# Patient Record
Sex: Female | Born: 1965 | Race: Black or African American | Hispanic: No | Marital: Married | Smoking: Never smoker
Health system: Southern US, Community
[De-identification: ages and names within clinical notes are randomized; demographics above are authoritative.]

## PROBLEM LIST (undated history)

## (undated) DIAGNOSIS — C50919 Malignant neoplasm of unspecified site of unspecified female breast: Secondary | ICD-10-CM

## (undated) DIAGNOSIS — E669 Obesity, unspecified: Secondary | ICD-10-CM

## (undated) DIAGNOSIS — K219 Gastro-esophageal reflux disease without esophagitis: Secondary | ICD-10-CM

## (undated) DIAGNOSIS — I10 Essential (primary) hypertension: Secondary | ICD-10-CM

## (undated) DIAGNOSIS — K635 Polyp of colon: Secondary | ICD-10-CM

## (undated) DIAGNOSIS — M51369 Other intervertebral disc degeneration, lumbar region without mention of lumbar back pain or lower extremity pain: Secondary | ICD-10-CM

## (undated) DIAGNOSIS — Z923 Personal history of irradiation: Secondary | ICD-10-CM

## (undated) DIAGNOSIS — I513 Intracardiac thrombosis, not elsewhere classified: Principal | ICD-10-CM

## (undated) DIAGNOSIS — M5136 Other intervertebral disc degeneration, lumbar region: Secondary | ICD-10-CM

## (undated) DIAGNOSIS — Z853 Personal history of malignant neoplasm of breast: Secondary | ICD-10-CM

## (undated) DIAGNOSIS — D259 Leiomyoma of uterus, unspecified: Secondary | ICD-10-CM

## (undated) DIAGNOSIS — Z9221 Personal history of antineoplastic chemotherapy: Secondary | ICD-10-CM

## (undated) DIAGNOSIS — Z8679 Personal history of other diseases of the circulatory system: Secondary | ICD-10-CM

## (undated) HISTORY — DX: Gastro-esophageal reflux disease without esophagitis: K21.9

## (undated) HISTORY — DX: Other intervertebral disc degeneration, lumbar region: M51.36

## (undated) HISTORY — PX: OTHER SURGICAL HISTORY: SHX169

## (undated) HISTORY — DX: Intracardiac thrombosis, not elsewhere classified: I51.3

## (undated) HISTORY — DX: Personal history of malignant neoplasm of breast: Z85.3

## (undated) HISTORY — PX: LUMBAR LAMINECTOMY/DECOMPRESSION MICRODISCECTOMY: SHX5026

## (undated) HISTORY — DX: Polyp of colon: K63.5

## (undated) HISTORY — DX: Leiomyoma of uterus, unspecified: D25.9

## (undated) HISTORY — DX: Obesity, unspecified: E66.9

## (undated) HISTORY — DX: Malignant neoplasm of unspecified site of unspecified female breast: C50.919

## (undated) HISTORY — DX: Other intervertebral disc degeneration, lumbar region without mention of lumbar back pain or lower extremity pain: M51.369

## (undated) HISTORY — PX: UTERINE FIBROID EMBOLIZATION: SHX825

## (undated) HISTORY — PX: TRANSTHORACIC ECHOCARDIOGRAM: SHX275

## (undated) HISTORY — DX: Personal history of other diseases of the circulatory system: Z86.79

---

## 2002-06-30 ENCOUNTER — Emergency Department (HOSPITAL_COMMUNITY): Admission: EM | Admit: 2002-06-30 | Discharge: 2002-06-30 | Payer: Self-pay | Admitting: Emergency Medicine

## 2002-07-01 ENCOUNTER — Encounter: Payer: Self-pay | Admitting: Emergency Medicine

## 2003-05-21 ENCOUNTER — Other Ambulatory Visit: Admission: RE | Admit: 2003-05-21 | Discharge: 2003-05-21 | Payer: Self-pay | Admitting: Obstetrics and Gynecology

## 2003-06-11 ENCOUNTER — Emergency Department (HOSPITAL_COMMUNITY): Admission: EM | Admit: 2003-06-11 | Discharge: 2003-06-11 | Payer: Self-pay | Admitting: Emergency Medicine

## 2003-10-18 ENCOUNTER — Ambulatory Visit (HOSPITAL_COMMUNITY): Admission: RE | Admit: 2003-10-18 | Discharge: 2003-10-18 | Payer: Self-pay | Admitting: Obstetrics and Gynecology

## 2003-10-18 ENCOUNTER — Encounter (INDEPENDENT_AMBULATORY_CARE_PROVIDER_SITE_OTHER): Payer: Self-pay | Admitting: Specialist

## 2003-11-03 ENCOUNTER — Other Ambulatory Visit: Admission: RE | Admit: 2003-11-03 | Discharge: 2003-11-03 | Payer: Self-pay | Admitting: Obstetrics and Gynecology

## 2004-02-02 ENCOUNTER — Other Ambulatory Visit: Admission: RE | Admit: 2004-02-02 | Discharge: 2004-02-02 | Payer: Self-pay | Admitting: Obstetrics and Gynecology

## 2004-04-14 ENCOUNTER — Ambulatory Visit (HOSPITAL_COMMUNITY): Admission: RE | Admit: 2004-04-14 | Discharge: 2004-04-14 | Payer: Self-pay | Admitting: Obstetrics and Gynecology

## 2004-05-15 ENCOUNTER — Other Ambulatory Visit: Admission: RE | Admit: 2004-05-15 | Discharge: 2004-05-15 | Payer: Self-pay | Admitting: Obstetrics and Gynecology

## 2004-08-22 ENCOUNTER — Other Ambulatory Visit: Admission: RE | Admit: 2004-08-22 | Discharge: 2004-08-22 | Payer: Self-pay | Admitting: Obstetrics and Gynecology

## 2004-12-20 ENCOUNTER — Ambulatory Visit (HOSPITAL_COMMUNITY): Admission: RE | Admit: 2004-12-20 | Discharge: 2004-12-20 | Payer: Self-pay | Admitting: *Deleted

## 2005-06-19 ENCOUNTER — Encounter (INDEPENDENT_AMBULATORY_CARE_PROVIDER_SITE_OTHER): Payer: Self-pay | Admitting: Cardiology

## 2005-06-19 ENCOUNTER — Observation Stay (HOSPITAL_COMMUNITY): Admission: EM | Admit: 2005-06-19 | Discharge: 2005-06-20 | Payer: Self-pay | Admitting: *Deleted

## 2005-08-24 ENCOUNTER — Encounter (INDEPENDENT_AMBULATORY_CARE_PROVIDER_SITE_OTHER): Payer: Self-pay | Admitting: *Deleted

## 2005-08-24 ENCOUNTER — Ambulatory Visit (HOSPITAL_BASED_OUTPATIENT_CLINIC_OR_DEPARTMENT_OTHER): Admission: RE | Admit: 2005-08-24 | Discharge: 2005-08-24 | Payer: Self-pay | Admitting: General Surgery

## 2005-08-24 ENCOUNTER — Encounter: Admission: RE | Admit: 2005-08-24 | Discharge: 2005-08-24 | Payer: Self-pay | Admitting: General Surgery

## 2006-02-10 ENCOUNTER — Inpatient Hospital Stay (HOSPITAL_COMMUNITY): Admission: AD | Admit: 2006-02-10 | Discharge: 2006-02-10 | Payer: Self-pay | Admitting: Obstetrics & Gynecology

## 2006-05-09 ENCOUNTER — Encounter: Admission: RE | Admit: 2006-05-09 | Discharge: 2006-05-09 | Payer: Self-pay | Admitting: Neurosurgery

## 2006-06-14 ENCOUNTER — Ambulatory Visit (HOSPITAL_COMMUNITY): Admission: RE | Admit: 2006-06-14 | Discharge: 2006-06-15 | Payer: Self-pay | Admitting: Neurosurgery

## 2006-08-27 ENCOUNTER — Ambulatory Visit: Payer: Self-pay | Admitting: Family Medicine

## 2006-10-11 ENCOUNTER — Encounter: Admission: RE | Admit: 2006-10-11 | Discharge: 2006-10-11 | Payer: Self-pay | Admitting: Neurosurgery

## 2006-10-31 ENCOUNTER — Encounter
Admission: RE | Admit: 2006-10-31 | Discharge: 2006-10-31 | Payer: Self-pay | Admitting: Physical Medicine and Rehabilitation

## 2006-12-10 ENCOUNTER — Encounter
Admission: RE | Admit: 2006-12-10 | Discharge: 2006-12-10 | Payer: Self-pay | Admitting: Physical Medicine and Rehabilitation

## 2007-01-10 ENCOUNTER — Ambulatory Visit (HOSPITAL_COMMUNITY): Admission: RE | Admit: 2007-01-10 | Discharge: 2007-01-11 | Payer: Self-pay | Admitting: Neurosurgery

## 2007-04-02 ENCOUNTER — Ambulatory Visit: Payer: Self-pay | Admitting: Family Medicine

## 2007-05-09 ENCOUNTER — Ambulatory Visit: Payer: Self-pay | Admitting: Family Medicine

## 2007-08-21 ENCOUNTER — Ambulatory Visit: Payer: Self-pay | Admitting: Family Medicine

## 2007-09-02 ENCOUNTER — Encounter: Admission: RE | Admit: 2007-09-02 | Discharge: 2007-09-02 | Payer: Self-pay | Admitting: Neurosurgery

## 2007-09-22 ENCOUNTER — Ambulatory Visit: Payer: Self-pay | Admitting: Family Medicine

## 2007-09-24 ENCOUNTER — Encounter: Admission: RE | Admit: 2007-09-24 | Discharge: 2007-09-24 | Payer: Self-pay | Admitting: Obstetrics and Gynecology

## 2007-09-26 ENCOUNTER — Encounter: Admission: RE | Admit: 2007-09-26 | Discharge: 2007-09-26 | Payer: Self-pay | Admitting: Family Medicine

## 2007-09-30 ENCOUNTER — Ambulatory Visit: Payer: Self-pay | Admitting: Family Medicine

## 2007-10-10 ENCOUNTER — Encounter: Admission: RE | Admit: 2007-10-10 | Discharge: 2007-10-10 | Payer: Self-pay | Admitting: Internal Medicine

## 2007-10-22 ENCOUNTER — Ambulatory Visit: Payer: Self-pay | Admitting: Family Medicine

## 2007-11-05 ENCOUNTER — Ambulatory Visit: Payer: Self-pay | Admitting: Family Medicine

## 2007-11-26 ENCOUNTER — Ambulatory Visit: Payer: Self-pay | Admitting: Family Medicine

## 2007-11-26 ENCOUNTER — Encounter: Admission: RE | Admit: 2007-11-26 | Discharge: 2007-11-26 | Payer: Self-pay | Admitting: Family Medicine

## 2007-12-10 ENCOUNTER — Encounter: Admission: RE | Admit: 2007-12-10 | Discharge: 2007-12-10 | Payer: Self-pay | Admitting: Obstetrics and Gynecology

## 2007-12-17 ENCOUNTER — Encounter: Admission: RE | Admit: 2007-12-17 | Discharge: 2007-12-17 | Payer: Self-pay | Admitting: Diagnostic Radiology

## 2008-02-02 ENCOUNTER — Ambulatory Visit: Payer: Self-pay | Admitting: Family Medicine

## 2008-02-03 ENCOUNTER — Ambulatory Visit (HOSPITAL_COMMUNITY): Admission: RE | Admit: 2008-02-03 | Discharge: 2008-02-03 | Payer: Self-pay | Admitting: Diagnostic Radiology

## 2008-02-05 ENCOUNTER — Ambulatory Visit (HOSPITAL_COMMUNITY): Admission: RE | Admit: 2008-02-05 | Discharge: 2008-02-06 | Payer: Self-pay | Admitting: Diagnostic Radiology

## 2008-02-15 ENCOUNTER — Emergency Department (HOSPITAL_COMMUNITY): Admission: EM | Admit: 2008-02-15 | Discharge: 2008-02-15 | Payer: Self-pay | Admitting: Emergency Medicine

## 2008-03-09 ENCOUNTER — Encounter: Admission: RE | Admit: 2008-03-09 | Discharge: 2008-03-09 | Payer: Self-pay | Admitting: Diagnostic Radiology

## 2008-05-23 ENCOUNTER — Encounter: Admission: RE | Admit: 2008-05-23 | Discharge: 2008-05-23 | Payer: Self-pay | Admitting: Neurosurgery

## 2008-06-15 ENCOUNTER — Ambulatory Visit: Payer: Self-pay | Admitting: Family Medicine

## 2008-07-15 ENCOUNTER — Ambulatory Visit: Payer: Self-pay | Admitting: Family Medicine

## 2008-07-20 ENCOUNTER — Ambulatory Visit: Payer: Self-pay | Admitting: Family Medicine

## 2008-07-30 DIAGNOSIS — Z853 Personal history of malignant neoplasm of breast: Secondary | ICD-10-CM

## 2008-07-30 HISTORY — DX: Personal history of malignant neoplasm of breast: Z85.3

## 2008-07-30 HISTORY — PX: COLONOSCOPY: SHX174

## 2008-07-30 HISTORY — PX: BREAST LUMPECTOMY: SHX2

## 2008-07-30 HISTORY — PX: TUBAL LIGATION: SHX77

## 2008-08-03 ENCOUNTER — Ambulatory Visit: Payer: Self-pay | Admitting: Family Medicine

## 2008-08-10 ENCOUNTER — Encounter: Admission: RE | Admit: 2008-08-10 | Discharge: 2008-08-10 | Payer: Self-pay | Admitting: Obstetrics and Gynecology

## 2008-08-10 ENCOUNTER — Encounter: Admission: RE | Admit: 2008-08-10 | Discharge: 2008-08-10 | Payer: Self-pay | Admitting: Diagnostic Radiology

## 2008-08-11 ENCOUNTER — Encounter (INDEPENDENT_AMBULATORY_CARE_PROVIDER_SITE_OTHER): Payer: Self-pay | Admitting: Radiology

## 2008-08-11 ENCOUNTER — Encounter: Admission: RE | Admit: 2008-08-11 | Discharge: 2008-08-11 | Payer: Self-pay | Admitting: Obstetrics and Gynecology

## 2008-08-11 HISTORY — PX: BREAST BIOPSY: SHX20

## 2008-08-12 ENCOUNTER — Encounter: Admission: RE | Admit: 2008-08-12 | Discharge: 2008-08-12 | Payer: Self-pay | Admitting: Obstetrics and Gynecology

## 2008-08-16 ENCOUNTER — Ambulatory Visit: Payer: Self-pay | Admitting: Genetic Counselor

## 2008-08-16 ENCOUNTER — Ambulatory Visit: Payer: Self-pay | Admitting: Family Medicine

## 2008-08-16 ENCOUNTER — Ambulatory Visit: Payer: Self-pay | Admitting: Oncology

## 2008-08-20 ENCOUNTER — Encounter: Admission: RE | Admit: 2008-08-20 | Discharge: 2008-08-20 | Payer: Self-pay | Admitting: Obstetrics and Gynecology

## 2008-08-24 ENCOUNTER — Encounter: Admission: RE | Admit: 2008-08-24 | Discharge: 2008-08-24 | Payer: Self-pay | Admitting: Surgery

## 2008-09-06 ENCOUNTER — Encounter (INDEPENDENT_AMBULATORY_CARE_PROVIDER_SITE_OTHER): Payer: Self-pay | Admitting: Diagnostic Radiology

## 2008-09-06 ENCOUNTER — Encounter: Admission: RE | Admit: 2008-09-06 | Discharge: 2008-09-06 | Payer: Self-pay | Admitting: Surgery

## 2008-09-06 HISTORY — PX: BREAST BIOPSY: SHX20

## 2008-09-17 ENCOUNTER — Encounter: Admission: RE | Admit: 2008-09-17 | Discharge: 2008-09-17 | Payer: Self-pay | Admitting: Surgery

## 2008-09-17 ENCOUNTER — Encounter (INDEPENDENT_AMBULATORY_CARE_PROVIDER_SITE_OTHER): Payer: Self-pay | Admitting: Surgery

## 2008-09-17 ENCOUNTER — Ambulatory Visit (HOSPITAL_COMMUNITY): Admission: RE | Admit: 2008-09-17 | Discharge: 2008-09-17 | Payer: Self-pay | Admitting: Surgery

## 2008-09-21 ENCOUNTER — Ambulatory Visit: Payer: Self-pay | Admitting: Family Medicine

## 2008-09-27 ENCOUNTER — Ambulatory Visit: Admission: RE | Admit: 2008-09-27 | Discharge: 2008-11-01 | Payer: Self-pay | Admitting: Radiation Oncology

## 2008-09-29 ENCOUNTER — Ambulatory Visit: Payer: Self-pay | Admitting: Oncology

## 2008-09-30 LAB — CMP (CANCER CENTER ONLY)
Alkaline Phosphatase: 64 U/L (ref 26–84)
BUN, Bld: 8 mg/dL (ref 7–22)
CO2: 27 mEq/L (ref 18–33)
Creat: 0.8 mg/dl (ref 0.6–1.2)
Glucose, Bld: 94 mg/dL (ref 73–118)
Sodium: 142 mEq/L (ref 128–145)
Total Bilirubin: 0.5 mg/dl (ref 0.20–1.60)
Total Protein: 8.2 g/dL — ABNORMAL HIGH (ref 6.4–8.1)

## 2008-09-30 LAB — CBC WITH DIFFERENTIAL (CANCER CENTER ONLY)
BASO%: 0.9 % (ref 0.0–2.0)
EOS%: 3.3 % (ref 0.0–7.0)
HCT: 39.3 % (ref 34.8–46.6)
LYMPH#: 2.5 10*3/uL (ref 0.9–3.3)
LYMPH%: 37.6 % (ref 14.0–48.0)
MCH: 30.9 pg (ref 26.0–34.0)
MCHC: 34.2 g/dL (ref 32.0–36.0)
MONO%: 4.7 % (ref 0.0–13.0)
NEUT%: 53.5 % (ref 39.6–80.0)
RDW: 10.8 % (ref 10.5–14.6)

## 2008-09-30 LAB — CANCER ANTIGEN 27.29: CA 27.29: 23 U/mL (ref 0–39)

## 2008-10-05 ENCOUNTER — Ambulatory Visit (HOSPITAL_COMMUNITY): Admission: RE | Admit: 2008-10-05 | Discharge: 2008-10-05 | Payer: Self-pay | Admitting: Oncology

## 2008-10-06 ENCOUNTER — Ambulatory Visit (HOSPITAL_COMMUNITY): Admission: RE | Admit: 2008-10-06 | Discharge: 2008-10-06 | Payer: Self-pay | Admitting: Surgery

## 2008-10-07 ENCOUNTER — Encounter: Payer: Self-pay | Admitting: Oncology

## 2008-10-07 ENCOUNTER — Ambulatory Visit: Admission: RE | Admit: 2008-10-07 | Discharge: 2008-10-07 | Payer: Self-pay | Admitting: Oncology

## 2008-10-11 ENCOUNTER — Emergency Department (HOSPITAL_COMMUNITY): Admission: EM | Admit: 2008-10-11 | Discharge: 2008-10-11 | Payer: Self-pay | Admitting: Emergency Medicine

## 2008-10-12 LAB — CMP (CANCER CENTER ONLY)
Albumin: 3.5 g/dL (ref 3.3–5.5)
BUN, Bld: 6 mg/dL — ABNORMAL LOW (ref 7–22)
CO2: 29 mEq/L (ref 18–33)
Glucose, Bld: 106 mg/dL (ref 73–118)
Sodium: 142 mEq/L (ref 128–145)
Total Bilirubin: 0.6 mg/dl (ref 0.20–1.60)
Total Protein: 8.2 g/dL — ABNORMAL HIGH (ref 6.4–8.1)

## 2008-10-12 LAB — CBC WITH DIFFERENTIAL (CANCER CENTER ONLY)
BASO%: 0.9 % (ref 0.0–2.0)
EOS%: 2.8 % (ref 0.0–7.0)
HGB: 13.7 g/dL (ref 11.6–15.9)
LYMPH#: 3.5 10*3/uL — ABNORMAL HIGH (ref 0.9–3.3)
MCH: 30.4 pg (ref 26.0–34.0)
MCHC: 33.7 g/dL (ref 32.0–36.0)
MONO%: 7.2 % (ref 0.0–13.0)
NEUT#: 3.3 10*3/uL (ref 1.5–6.5)
Platelets: 291 10*3/uL (ref 145–400)
RDW: 11.2 % (ref 10.5–14.6)

## 2008-10-19 ENCOUNTER — Ambulatory Visit: Payer: Self-pay | Admitting: Family Medicine

## 2008-10-19 LAB — CBC WITH DIFFERENTIAL (CANCER CENTER ONLY)
BASO#: 0 10*3/uL (ref 0.0–0.2)
Eosinophils Absolute: 0.1 10*3/uL (ref 0.0–0.5)
HGB: 13.2 g/dL (ref 11.6–15.9)
LYMPH#: 1.5 10*3/uL (ref 0.9–3.3)
MONO%: 7 % (ref 0.0–13.0)
NEUT#: 1.2 10*3/uL — ABNORMAL LOW (ref 1.5–6.5)
Platelets: 217 10*3/uL (ref 145–400)
RBC: 4.29 10*6/uL (ref 3.70–5.32)
WBC: 3.1 10*3/uL — ABNORMAL LOW (ref 3.9–10.0)

## 2008-10-19 LAB — BASIC METABOLIC PANEL - CANCER CENTER ONLY
Glucose, Bld: 113 mg/dL (ref 73–118)
Potassium: 3.5 mEq/L (ref 3.3–4.7)
Sodium: 137 mEq/L (ref 128–145)

## 2008-10-26 LAB — CBC WITH DIFFERENTIAL (CANCER CENTER ONLY)
BASO#: 0.1 10*3/uL (ref 0.0–0.2)
Eosinophils Absolute: 0.1 10*3/uL (ref 0.0–0.5)
HGB: 12.1 g/dL (ref 11.6–15.9)
LYMPH%: 35.3 % (ref 14.0–48.0)
MCH: 30.6 pg (ref 26.0–34.0)
MCV: 90 fL (ref 81–101)
MONO#: 0.7 10*3/uL (ref 0.1–0.9)
MONO%: 10.6 % (ref 0.0–13.0)
NEUT#: 3.6 10*3/uL (ref 1.5–6.5)
Platelets: 329 10*3/uL (ref 145–400)
RBC: 3.95 10*6/uL (ref 3.70–5.32)
WBC: 6.9 10*3/uL (ref 3.9–10.0)

## 2008-10-26 LAB — CMP (CANCER CENTER ONLY)
Albumin: 3.5 g/dL (ref 3.3–5.5)
CO2: 26 mEq/L (ref 18–33)
Calcium: 9.2 mg/dL (ref 8.0–10.3)
Glucose, Bld: 105 mg/dL (ref 73–118)
Potassium: 3.5 mEq/L (ref 3.3–4.7)
Sodium: 137 mEq/L (ref 128–145)
Total Protein: 7.6 g/dL (ref 6.4–8.1)

## 2008-11-01 ENCOUNTER — Ambulatory Visit: Payer: Self-pay | Admitting: Psychiatry

## 2008-11-02 LAB — CBC WITH DIFFERENTIAL (CANCER CENTER ONLY)
Eosinophils Absolute: 0 10*3/uL (ref 0.0–0.5)
HCT: 34.2 % — ABNORMAL LOW (ref 34.8–46.6)
LYMPH%: 41.6 % (ref 14.0–48.0)
MCH: 30.6 pg (ref 26.0–34.0)
MCV: 89 fL (ref 81–101)
MONO#: 0.2 10*3/uL (ref 0.1–0.9)
MONO%: 7 % (ref 0.0–13.0)
NEUT%: 49.1 % (ref 39.6–80.0)
Platelets: 223 10*3/uL (ref 145–400)
RBC: 3.84 10*6/uL (ref 3.70–5.32)
WBC: 2.6 10*3/uL — ABNORMAL LOW (ref 3.9–10.0)

## 2008-11-02 LAB — BASIC METABOLIC PANEL - CANCER CENTER ONLY
CO2: 28 mEq/L (ref 18–33)
Calcium: 9 mg/dL (ref 8.0–10.3)
Glucose, Bld: 116 mg/dL (ref 73–118)
Potassium: 3.7 mEq/L (ref 3.3–4.7)
Sodium: 143 mEq/L (ref 128–145)

## 2008-11-04 ENCOUNTER — Ambulatory Visit (HOSPITAL_COMMUNITY): Admission: RE | Admit: 2008-11-04 | Discharge: 2008-11-04 | Payer: Self-pay | Admitting: Oncology

## 2008-11-05 ENCOUNTER — Ambulatory Visit (HOSPITAL_COMMUNITY): Admission: RE | Admit: 2008-11-05 | Discharge: 2008-11-05 | Payer: Self-pay | Admitting: Oncology

## 2008-11-09 ENCOUNTER — Ambulatory Visit (HOSPITAL_COMMUNITY): Admission: RE | Admit: 2008-11-09 | Discharge: 2008-11-09 | Payer: Self-pay | Admitting: Oncology

## 2008-11-09 LAB — MANUAL DIFFERENTIAL (CHCC SATELLITE)
ANC (CHCC HP manual diff): 7.3 10*3/uL — ABNORMAL HIGH (ref 1.5–6.7)
Band Neutrophils: 6 % (ref 0–10)
Metamyelocytes: 2 % — ABNORMAL HIGH (ref 0–0)
Myelocytes: 1 % — ABNORMAL HIGH (ref 0–0)
RBC Comments: NORMAL

## 2008-11-09 LAB — CBC WITH DIFFERENTIAL (CANCER CENTER ONLY)
HCT: 34.6 % — ABNORMAL LOW (ref 34.8–46.6)
HGB: 12 g/dL (ref 11.6–15.9)
MCH: 30.6 pg (ref 26.0–34.0)
MCV: 89 fL (ref 81–101)
Platelets: 251 10*3/uL (ref 145–400)
RDW: 11 % (ref 10.5–14.6)
WBC: 9.4 10*3/uL (ref 3.9–10.0)

## 2008-11-09 LAB — CMP (CANCER CENTER ONLY)
ALT(SGPT): 45 U/L (ref 10–47)
CO2: 27 mEq/L (ref 18–33)
Calcium: 9.2 mg/dL (ref 8.0–10.3)
Chloride: 103 mEq/L (ref 98–108)
Creat: 0.7 mg/dl (ref 0.6–1.2)
Glucose, Bld: 99 mg/dL (ref 73–118)
Total Bilirubin: 0.4 mg/dl (ref 0.20–1.60)
Total Protein: 8 g/dL (ref 6.4–8.1)

## 2008-11-09 LAB — TECHNOLOGIST REVIEW CHCC SATELLITE

## 2008-11-15 ENCOUNTER — Ambulatory Visit: Payer: Self-pay | Admitting: Psychiatry

## 2008-11-15 ENCOUNTER — Ambulatory Visit: Payer: Self-pay | Admitting: Oncology

## 2008-11-16 LAB — BASIC METABOLIC PANEL - CANCER CENTER ONLY
BUN, Bld: 8 mg/dL (ref 7–22)
CO2: 26 mEq/L (ref 18–33)
Calcium: 9.5 mg/dL (ref 8.0–10.3)
Creat: 0.7 mg/dl (ref 0.6–1.2)
Glucose, Bld: 122 mg/dL — ABNORMAL HIGH (ref 73–118)

## 2008-11-16 LAB — CBC WITH DIFFERENTIAL (CANCER CENTER ONLY)
BASO%: 0.8 % (ref 0.0–2.0)
EOS%: 2.6 % (ref 0.0–7.0)
HCT: 34.4 % — ABNORMAL LOW (ref 34.8–46.6)
LYMPH#: 1 10*3/uL (ref 0.9–3.3)
MCHC: 34.5 g/dL (ref 32.0–36.0)
MONO#: 0.2 10*3/uL (ref 0.1–0.9)
NEUT#: 0.9 10*3/uL — ABNORMAL LOW (ref 1.5–6.5)
NEUT%: 41.6 % (ref 39.6–80.0)
Platelets: 89 10*3/uL — ABNORMAL LOW (ref 145–400)
RDW: 11.3 % (ref 10.5–14.6)
WBC: 2.2 10*3/uL — ABNORMAL LOW (ref 3.9–10.0)

## 2008-11-23 LAB — CBC WITH DIFFERENTIAL (CANCER CENTER ONLY)
BASO%: 1 % (ref 0.0–2.0)
EOS%: 1.1 % (ref 0.0–7.0)
HCT: 29.5 % — ABNORMAL LOW (ref 34.8–46.6)
LYMPH%: 19.9 % (ref 14.0–48.0)
MCH: 31.1 pg (ref 26.0–34.0)
MCHC: 35.3 g/dL (ref 32.0–36.0)
MCV: 88 fL (ref 81–101)
MONO%: 13.4 % — ABNORMAL HIGH (ref 0.0–13.0)
NEUT%: 64.6 % (ref 39.6–80.0)
Platelets: 350 10*3/uL (ref 145–400)
RDW: 11.1 % (ref 10.5–14.6)
WBC: 6.9 10*3/uL (ref 3.9–10.0)

## 2008-11-23 LAB — CMP (CANCER CENTER ONLY)
ALT(SGPT): 31 U/L (ref 10–47)
AST: 35 U/L (ref 11–38)
Alkaline Phosphatase: 102 U/L — ABNORMAL HIGH (ref 26–84)
Creat: 0.7 mg/dl (ref 0.6–1.2)
Sodium: 144 mEq/L (ref 128–145)
Total Bilirubin: 0.5 mg/dl (ref 0.20–1.60)
Total Protein: 7.7 g/dL (ref 6.4–8.1)

## 2008-12-14 LAB — CMP (CANCER CENTER ONLY)
ALT(SGPT): 55 U/L — ABNORMAL HIGH (ref 10–47)
CO2: 24 mEq/L (ref 18–33)
Calcium: 9.7 mg/dL (ref 8.0–10.3)
Chloride: 104 mEq/L (ref 98–108)
Creat: 0.9 mg/dl (ref 0.6–1.2)
Total Protein: 8.7 g/dL — ABNORMAL HIGH (ref 6.4–8.1)

## 2008-12-14 LAB — CBC WITH DIFFERENTIAL (CANCER CENTER ONLY)
BASO#: 0 10*3/uL (ref 0.0–0.2)
Eosinophils Absolute: 0.1 10*3/uL (ref 0.0–0.5)
HGB: 11.6 g/dL (ref 11.6–15.9)
LYMPH#: 0.8 10*3/uL — ABNORMAL LOW (ref 0.9–3.3)
MONO#: 0.1 10*3/uL (ref 0.1–0.9)
NEUT#: 5.8 10*3/uL (ref 1.5–6.5)
Platelets: 357 10*3/uL (ref 145–400)
RBC: 3.64 10*6/uL — ABNORMAL LOW (ref 3.70–5.32)
WBC: 6.8 10*3/uL (ref 3.9–10.0)

## 2008-12-21 LAB — CMP (CANCER CENTER ONLY)
ALT(SGPT): 62 U/L — ABNORMAL HIGH (ref 10–47)
AST: 52 U/L — ABNORMAL HIGH (ref 11–38)
Albumin: 3.9 g/dL (ref 3.3–5.5)
Alkaline Phosphatase: 92 U/L — ABNORMAL HIGH (ref 26–84)
BUN, Bld: 10 mg/dL (ref 7–22)
CO2: 26 meq/L (ref 18–33)
Calcium: 10.1 mg/dL (ref 8.0–10.3)
Chloride: 102 meq/L (ref 98–108)
Creat: 0.7 mg/dL (ref 0.6–1.2)
Glucose, Bld: 157 mg/dL — ABNORMAL HIGH (ref 73–118)
Potassium: 4.3 meq/L (ref 3.3–4.7)
Sodium: 144 meq/L (ref 128–145)
Total Bilirubin: 0.5 mg/dL (ref 0.20–1.60)
Total Protein: 8.6 g/dL — ABNORMAL HIGH (ref 6.4–8.1)

## 2008-12-21 LAB — CBC WITH DIFFERENTIAL (CANCER CENTER ONLY)
BASO#: 0 10*3/uL (ref 0.0–0.2)
Eosinophils Absolute: 0 10*3/uL (ref 0.0–0.5)
HGB: 11.3 g/dL — ABNORMAL LOW (ref 11.6–15.9)
LYMPH%: 15.8 % (ref 14.0–48.0)
MCH: 32 pg (ref 26.0–34.0)
MCV: 91 fL (ref 81–101)
MONO#: 0 10*3/uL — ABNORMAL LOW (ref 0.1–0.9)
MONO%: 0.8 % (ref 0.0–13.0)
NEUT#: 3.6 10*3/uL (ref 1.5–6.5)
RBC: 3.52 10*6/uL — ABNORMAL LOW (ref 3.70–5.32)
WBC: 4.4 10*3/uL (ref 3.9–10.0)

## 2008-12-22 ENCOUNTER — Ambulatory Visit (HOSPITAL_COMMUNITY): Admission: RE | Admit: 2008-12-22 | Discharge: 2008-12-22 | Payer: Self-pay | Admitting: Oncology

## 2008-12-22 LAB — GAMMA GT: GGT: 126 U/L — ABNORMAL HIGH (ref 7–51)

## 2008-12-22 LAB — HEPATIC FUNCTION PANEL
ALT: 58 U/L — ABNORMAL HIGH (ref 0–35)
AST: 45 U/L — ABNORMAL HIGH (ref 0–37)
Albumin: 5 g/dL (ref 3.5–5.2)
Total Protein: 8.6 g/dL — ABNORMAL HIGH (ref 6.0–8.3)

## 2008-12-22 LAB — HEPATITIS B SURFACE ANTIBODY,QUALITATIVE: Hep B S Ab: NEGATIVE

## 2008-12-28 LAB — CBC WITH DIFFERENTIAL (CANCER CENTER ONLY)
BASO%: 0.2 % (ref 0.0–2.0)
EOS%: 1 % (ref 0.0–7.0)
HCT: 35.8 % (ref 34.8–46.6)
LYMPH#: 0.6 10*3/uL — ABNORMAL LOW (ref 0.9–3.3)
LYMPH%: 11.9 % — ABNORMAL LOW (ref 14.0–48.0)
MCHC: 34.8 g/dL (ref 32.0–36.0)
MCV: 92 fL (ref 81–101)
MONO%: 0.8 % (ref 0.0–13.0)
NEUT%: 86.1 % — ABNORMAL HIGH (ref 39.6–80.0)
RDW: 16.8 % — ABNORMAL HIGH (ref 10.5–14.6)

## 2008-12-28 LAB — CMP (CANCER CENTER ONLY)
ALT(SGPT): 51 U/L — ABNORMAL HIGH (ref 10–47)
AST: 36 U/L (ref 11–38)
Calcium: 9.9 mg/dL (ref 8.0–10.3)
Chloride: 102 mEq/L (ref 98–108)
Creat: 0.6 mg/dl (ref 0.6–1.2)
Sodium: 143 mEq/L (ref 128–145)
Total Bilirubin: 0.6 mg/dl (ref 0.20–1.60)
Total Protein: 8.4 g/dL — ABNORMAL HIGH (ref 6.4–8.1)

## 2008-12-29 ENCOUNTER — Ambulatory Visit: Payer: Self-pay | Admitting: Oncology

## 2009-01-04 LAB — CBC WITH DIFFERENTIAL (CANCER CENTER ONLY)
BASO%: 0.3 % (ref 0.0–2.0)
EOS%: 0.9 % (ref 0.0–7.0)
LYMPH#: 0.6 10*3/uL — ABNORMAL LOW (ref 0.9–3.3)
MCHC: 35.3 g/dL (ref 32.0–36.0)
NEUT#: 6.2 10*3/uL (ref 1.5–6.5)
Platelets: 328 10*3/uL (ref 145–400)
RDW: 16.8 % — ABNORMAL HIGH (ref 10.5–14.6)

## 2009-01-04 LAB — CMP (CANCER CENTER ONLY)
Alkaline Phosphatase: 92 U/L — ABNORMAL HIGH (ref 26–84)
CO2: 20 mEq/L (ref 18–33)
Creat: 0.8 mg/dl (ref 0.6–1.2)
Glucose, Bld: 237 mg/dL — ABNORMAL HIGH (ref 73–118)
Total Bilirubin: 0.6 mg/dl (ref 0.20–1.60)

## 2009-01-11 LAB — CMP (CANCER CENTER ONLY)
Albumin: 3.8 g/dL (ref 3.3–5.5)
CO2: 22 mEq/L (ref 18–33)
Calcium: 10.4 mg/dL — ABNORMAL HIGH (ref 8.0–10.3)
Chloride: 106 mEq/L (ref 98–108)
Glucose, Bld: 166 mg/dL — ABNORMAL HIGH (ref 73–118)
Potassium: 4.5 mEq/L (ref 3.3–4.7)
Sodium: 137 mEq/L (ref 128–145)
Total Protein: 8.8 g/dL — ABNORMAL HIGH (ref 6.4–8.1)

## 2009-01-11 LAB — CBC WITH DIFFERENTIAL (CANCER CENTER ONLY)
BASO#: 0 10*3/uL (ref 0.0–0.2)
EOS%: 1.1 % (ref 0.0–7.0)
Eosinophils Absolute: 0.1 10*3/uL (ref 0.0–0.5)
HGB: 12.5 g/dL (ref 11.6–15.9)
LYMPH#: 0.7 10*3/uL — ABNORMAL LOW (ref 0.9–3.3)
MCHC: 34.8 g/dL (ref 32.0–36.0)
NEUT#: 5.4 10*3/uL (ref 1.5–6.5)
Platelets: 295 10*3/uL (ref 145–400)
RBC: 3.85 10*6/uL (ref 3.70–5.32)

## 2009-01-14 ENCOUNTER — Ambulatory Visit: Payer: Self-pay | Admitting: Family Medicine

## 2009-01-18 LAB — CMP (CANCER CENTER ONLY)
ALT(SGPT): 56 U/L — ABNORMAL HIGH (ref 10–47)
AST: 36 U/L (ref 11–38)
Albumin: 3.5 g/dL (ref 3.3–5.5)
Alkaline Phosphatase: 84 U/L (ref 26–84)
BUN, Bld: 11 mg/dL (ref 7–22)
Potassium: 4.2 mEq/L (ref 3.3–4.7)
Sodium: 141 mEq/L (ref 128–145)
Total Protein: 8 g/dL (ref 6.4–8.1)

## 2009-01-18 LAB — CBC WITH DIFFERENTIAL (CANCER CENTER ONLY)
BASO#: 0 10*3/uL (ref 0.0–0.2)
Eosinophils Absolute: 0 10*3/uL (ref 0.0–0.5)
HCT: 34.9 % (ref 34.8–46.6)
HGB: 12.1 g/dL (ref 11.6–15.9)
LYMPH%: 21.8 % (ref 14.0–48.0)
MCH: 32.5 pg (ref 26.0–34.0)
MCV: 94 fL (ref 81–101)
MONO#: 0 10*3/uL — ABNORMAL LOW (ref 0.1–0.9)
MONO%: 1.1 % (ref 0.0–13.0)
NEUT%: 76.1 % (ref 39.6–80.0)
Platelets: 310 10*3/uL (ref 145–400)
RBC: 3.72 10*6/uL (ref 3.70–5.32)
WBC: 3.1 10*3/uL — ABNORMAL LOW (ref 3.9–10.0)

## 2009-01-25 LAB — CBC WITH DIFFERENTIAL (CANCER CENTER ONLY)
BASO#: 0 10*3/uL (ref 0.0–0.2)
Eosinophils Absolute: 0 10*3/uL (ref 0.0–0.5)
HGB: 12.4 g/dL (ref 11.6–15.9)
MCH: 32.8 pg (ref 26.0–34.0)
MONO#: 0 10*3/uL — ABNORMAL LOW (ref 0.1–0.9)
MONO%: 0.9 % (ref 0.0–13.0)
NEUT#: 3.3 10*3/uL (ref 1.5–6.5)
RBC: 3.79 10*6/uL (ref 3.70–5.32)
WBC: 4.2 10*3/uL (ref 3.9–10.0)

## 2009-01-25 LAB — CMP (CANCER CENTER ONLY)
AST: 35 U/L (ref 11–38)
BUN, Bld: 11 mg/dL (ref 7–22)
Calcium: 9.9 mg/dL (ref 8.0–10.3)
Chloride: 99 mEq/L (ref 98–108)
Creat: 0.7 mg/dl (ref 0.6–1.2)

## 2009-01-26 ENCOUNTER — Ambulatory Visit: Payer: Self-pay | Admitting: Oncology

## 2009-02-01 LAB — BASIC METABOLIC PANEL - CANCER CENTER ONLY
BUN, Bld: 13 mg/dL (ref 7–22)
Calcium: 10 mg/dL (ref 8.0–10.3)
Creat: 0.6 mg/dl (ref 0.6–1.2)
Glucose, Bld: 198 mg/dL — ABNORMAL HIGH (ref 73–118)
Potassium: 4.3 mEq/L (ref 3.3–4.7)

## 2009-02-01 LAB — CBC WITH DIFFERENTIAL (CANCER CENTER ONLY)
EOS%: 0.8 % (ref 0.0–7.0)
Eosinophils Absolute: 0 10*3/uL (ref 0.0–0.5)
LYMPH#: 1 10*3/uL (ref 0.9–3.3)
MCH: 33 pg (ref 26.0–34.0)
MCHC: 34.8 g/dL (ref 32.0–36.0)
MONO%: 1.5 % (ref 0.0–13.0)
NEUT#: 3.5 10*3/uL (ref 1.5–6.5)
Platelets: 289 10*3/uL (ref 145–400)
RBC: 3.71 10*6/uL (ref 3.70–5.32)

## 2009-02-08 LAB — CMP (CANCER CENTER ONLY)
Alkaline Phosphatase: 87 U/L — ABNORMAL HIGH (ref 26–84)
Glucose, Bld: 166 mg/dL — ABNORMAL HIGH (ref 73–118)
Sodium: 135 mEq/L (ref 128–145)
Total Bilirubin: 0.5 mg/dl (ref 0.20–1.60)
Total Protein: 8.5 g/dL — ABNORMAL HIGH (ref 6.4–8.1)

## 2009-02-08 LAB — CBC WITH DIFFERENTIAL (CANCER CENTER ONLY)
Eosinophils Absolute: 0 10*3/uL (ref 0.0–0.5)
MONO#: 0.1 10*3/uL (ref 0.1–0.9)
NEUT#: 4.9 10*3/uL (ref 1.5–6.5)
Platelets: 280 10*3/uL (ref 145–400)
RBC: 3.94 10*6/uL (ref 3.70–5.32)
WBC: 6.4 10*3/uL (ref 3.9–10.0)

## 2009-02-15 LAB — CMP (CANCER CENTER ONLY)
Albumin: 3.6 g/dL (ref 3.3–5.5)
Alkaline Phosphatase: 83 U/L (ref 26–84)
BUN, Bld: 10 mg/dL (ref 7–22)
Calcium: 9.5 mg/dL (ref 8.0–10.3)
Glucose, Bld: 170 mg/dL — ABNORMAL HIGH (ref 73–118)
Potassium: 4.6 mEq/L (ref 3.3–4.7)

## 2009-02-15 LAB — CBC WITH DIFFERENTIAL (CANCER CENTER ONLY)
Eosinophils Absolute: 0.1 10*3/uL (ref 0.0–0.5)
HCT: 36.5 % (ref 34.8–46.6)
HGB: 12.8 g/dL (ref 11.6–15.9)
LYMPH%: 19.1 % (ref 14.0–48.0)
MCV: 95 fL (ref 81–101)
MONO#: 0.1 10*3/uL (ref 0.1–0.9)
NEUT%: 78.2 % (ref 39.6–80.0)
Platelets: 281 10*3/uL (ref 145–400)
RBC: 3.83 10*6/uL (ref 3.70–5.32)
WBC: 5.1 10*3/uL (ref 3.9–10.0)

## 2009-02-22 LAB — CBC WITH DIFFERENTIAL (CANCER CENTER ONLY)
BASO%: 0.4 % (ref 0.0–2.0)
HCT: 37.5 % (ref 34.8–46.6)
LYMPH%: 17.2 % (ref 14.0–48.0)
MCV: 95 fL (ref 81–101)
MONO#: 0.1 10*3/uL (ref 0.1–0.9)
NEUT%: 80.4 % — ABNORMAL HIGH (ref 39.6–80.0)
RDW: 13.3 % (ref 10.5–14.6)
WBC: 7.1 10*3/uL (ref 3.9–10.0)

## 2009-02-22 LAB — BASIC METABOLIC PANEL
CO2: 25 mEq/L (ref 19–32)
Calcium: 9.8 mg/dL (ref 8.4–10.5)
Creatinine, Ser: 0.68 mg/dL (ref 0.40–1.20)
Sodium: 142 mEq/L (ref 135–145)

## 2009-02-25 ENCOUNTER — Ambulatory Visit: Payer: Self-pay | Admitting: Oncology

## 2009-03-01 LAB — MANUAL DIFFERENTIAL (CHCC SATELLITE)
ALC: 2.7 10*3/uL — ABNORMAL HIGH (ref 0.6–2.2)
SEG: 53 % (ref 40–75)

## 2009-03-01 LAB — CBC WITH DIFFERENTIAL (CANCER CENTER ONLY)
HCT: 38.5 % (ref 34.8–46.6)
MCHC: 34.8 g/dL (ref 32.0–36.0)
Platelets: 153 10*3/uL (ref 145–400)
RDW: 12.7 % (ref 10.5–14.6)

## 2009-03-01 LAB — BASIC METABOLIC PANEL - CANCER CENTER ONLY
Calcium: 9 mg/dL (ref 8.0–10.3)
Creat: 0.7 mg/dl (ref 0.6–1.2)

## 2009-03-04 ENCOUNTER — Ambulatory Visit (HOSPITAL_COMMUNITY): Admission: RE | Admit: 2009-03-04 | Discharge: 2009-03-04 | Payer: Self-pay | Admitting: Oncology

## 2009-03-15 LAB — CMP (CANCER CENTER ONLY)
Albumin: 3.2 g/dL — ABNORMAL LOW (ref 3.3–5.5)
Alkaline Phosphatase: 93 U/L — ABNORMAL HIGH (ref 26–84)
BUN, Bld: 9 mg/dL (ref 7–22)
Glucose, Bld: 161 mg/dL — ABNORMAL HIGH (ref 73–118)
Potassium: 4.2 mEq/L (ref 3.3–4.7)
Total Bilirubin: 0.3 mg/dl (ref 0.20–1.60)

## 2009-03-15 LAB — CBC WITH DIFFERENTIAL (CANCER CENTER ONLY)
BASO%: 0.6 % (ref 0.0–2.0)
HCT: 36.2 % (ref 34.8–46.6)
LYMPH%: 15.5 % (ref 14.0–48.0)
MCH: 32.2 pg (ref 26.0–34.0)
MCHC: 34.7 g/dL (ref 32.0–36.0)
MCV: 93 fL (ref 81–101)
MONO#: 0.3 10*3/uL (ref 0.1–0.9)
MONO%: 3 % (ref 0.0–13.0)
NEUT%: 79.9 % (ref 39.6–80.0)
Platelets: 368 10*3/uL (ref 145–400)
RDW: 13.3 % (ref 10.5–14.6)
WBC: 10.2 10*3/uL — ABNORMAL HIGH (ref 3.9–10.0)

## 2009-03-15 LAB — URINALYSIS, MICROSCOPIC (CHCC SATELLITE)
Bilirubin (Urine): NEGATIVE
Blood: NEGATIVE
Glucose: NEGATIVE g/dL
Leukocyte Esterase: NEGATIVE

## 2009-03-15 LAB — TECHNOLOGIST REVIEW CHCC SATELLITE

## 2009-03-18 LAB — BASIC METABOLIC PANEL - CANCER CENTER ONLY
CO2: 28 mEq/L (ref 18–33)
Calcium: 8.9 mg/dL (ref 8.0–10.3)
Sodium: 140 mEq/L (ref 128–145)

## 2009-03-21 LAB — BASIC METABOLIC PANEL - CANCER CENTER ONLY
BUN, Bld: 8 mg/dL (ref 7–22)
CO2: 27 mEq/L (ref 18–33)
Calcium: 9.2 mg/dL (ref 8.0–10.3)
Chloride: 100 mEq/L (ref 98–108)
Creat: 0.6 mg/dl (ref 0.6–1.2)
Glucose, Bld: 113 mg/dL (ref 73–118)
Potassium: 3.9 mEq/L (ref 3.3–4.7)
Sodium: 136 mEq/L (ref 128–145)

## 2009-03-21 LAB — CBC WITH DIFFERENTIAL (CANCER CENTER ONLY)
HCT: 38.4 % (ref 34.8–46.6)
HGB: 13.2 g/dL (ref 11.6–15.9)
MCV: 93 fL (ref 81–101)
RBC: 4.11 10*6/uL (ref 3.70–5.32)
RDW: 12.3 % (ref 10.5–14.6)
WBC: 4.4 10*3/uL (ref 3.9–10.0)

## 2009-03-21 LAB — MANUAL DIFFERENTIAL (CHCC SATELLITE)
ANC (CHCC HP manual diff): 2.2 10*3/uL (ref 1.5–6.7)
RBC Comments: NORMAL
nRBC: 1 % — ABNORMAL HIGH (ref 0–0)

## 2009-03-29 ENCOUNTER — Ambulatory Visit: Payer: Self-pay | Admitting: Oncology

## 2009-04-05 LAB — CBC WITH DIFFERENTIAL (CANCER CENTER ONLY)
BASO#: 0.1 10*3/uL (ref 0.0–0.2)
EOS%: 1 % (ref 0.0–7.0)
HGB: 11.8 g/dL (ref 11.6–15.9)
LYMPH#: 1.3 10*3/uL (ref 0.9–3.3)
MCH: 32 pg (ref 26.0–34.0)
MCHC: 33.4 g/dL (ref 32.0–36.0)
MONO%: 3 % (ref 0.0–13.0)
NEUT#: 8.7 10*3/uL — ABNORMAL HIGH (ref 1.5–6.5)
Platelets: 291 10*3/uL (ref 145–400)
RBC: 3.68 10*6/uL — ABNORMAL LOW (ref 3.70–5.32)

## 2009-04-05 LAB — CMP (CANCER CENTER ONLY)
AST: 52 U/L — ABNORMAL HIGH (ref 11–38)
Albumin: 3.2 g/dL — ABNORMAL LOW (ref 3.3–5.5)
BUN, Bld: 11 mg/dL (ref 7–22)
Calcium: 9.3 mg/dL (ref 8.0–10.3)
Chloride: 107 mEq/L (ref 98–108)
Potassium: 3.9 mEq/L (ref 3.3–4.7)

## 2009-04-12 ENCOUNTER — Ambulatory Visit: Admission: RE | Admit: 2009-04-12 | Discharge: 2009-06-19 | Payer: Self-pay | Admitting: Radiation Oncology

## 2009-04-12 LAB — CMP (CANCER CENTER ONLY)
AST: 33 U/L (ref 11–38)
Alkaline Phosphatase: 99 U/L — ABNORMAL HIGH (ref 26–84)
BUN, Bld: 7 mg/dL (ref 7–22)
Creat: 0.7 mg/dl (ref 0.6–1.2)
Potassium: 3.5 mEq/L (ref 3.3–4.7)
Total Bilirubin: 0.6 mg/dl (ref 0.20–1.60)

## 2009-04-12 LAB — CBC WITH DIFFERENTIAL (CANCER CENTER ONLY)
HGB: 12.6 g/dL (ref 11.6–15.9)
MCH: 31.8 pg (ref 26.0–34.0)
Platelets: 203 10*3/uL (ref 145–400)
RBC: 3.95 10*6/uL (ref 3.70–5.32)
WBC: 16.6 10*3/uL — ABNORMAL HIGH (ref 3.9–10.0)

## 2009-04-12 LAB — MANUAL DIFFERENTIAL (CHCC SATELLITE)
Band Neutrophils: 18 % — ABNORMAL HIGH (ref 0–10)
LYMPH: 8 % — ABNORMAL LOW (ref 14–48)
MONO: 15 % — ABNORMAL HIGH (ref 0–13)
Metamyelocytes: 5 % — ABNORMAL HIGH (ref 0–0)
PLT EST ~~LOC~~: ADEQUATE
Platelet Morphology: NORMAL
SEG: 51 % (ref 40–75)

## 2009-04-15 LAB — CBC WITH DIFFERENTIAL/PLATELET
BASO%: 0.5 % (ref 0.0–2.0)
EOS%: 0 % (ref 0.0–7.0)
MCH: 32.7 pg (ref 25.1–34.0)
MCHC: 34.3 g/dL (ref 31.5–36.0)
MCV: 95.3 fL (ref 79.5–101.0)
MONO%: 3 % (ref 0.0–14.0)
NEUT%: 88.5 % — ABNORMAL HIGH (ref 38.4–76.8)
RDW: 16.1 % — ABNORMAL HIGH (ref 11.2–14.5)
lymph#: 1.7 10*3/uL (ref 0.9–3.3)

## 2009-04-20 ENCOUNTER — Ambulatory Visit: Payer: Self-pay | Admitting: Family Medicine

## 2009-04-21 ENCOUNTER — Ambulatory Visit: Payer: Self-pay | Admitting: Vascular Surgery

## 2009-04-21 ENCOUNTER — Inpatient Hospital Stay (HOSPITAL_COMMUNITY): Admission: EM | Admit: 2009-04-21 | Discharge: 2009-04-23 | Payer: Self-pay | Admitting: Emergency Medicine

## 2009-04-21 ENCOUNTER — Encounter (INDEPENDENT_AMBULATORY_CARE_PROVIDER_SITE_OTHER): Payer: Self-pay | Admitting: Internal Medicine

## 2009-04-28 ENCOUNTER — Encounter: Admission: RE | Admit: 2009-04-28 | Discharge: 2009-04-28 | Payer: Self-pay | Admitting: Neurosurgery

## 2009-04-29 ENCOUNTER — Ambulatory Visit: Payer: Self-pay | Admitting: Internal Medicine

## 2009-04-29 ENCOUNTER — Ambulatory Visit: Payer: Self-pay | Admitting: Family Medicine

## 2009-04-29 ENCOUNTER — Inpatient Hospital Stay (HOSPITAL_COMMUNITY): Admission: EM | Admit: 2009-04-29 | Discharge: 2009-05-02 | Payer: Self-pay | Admitting: Internal Medicine

## 2009-05-01 ENCOUNTER — Encounter: Payer: Self-pay | Admitting: Internal Medicine

## 2009-05-03 ENCOUNTER — Ambulatory Visit: Payer: Self-pay | Admitting: Oncology

## 2009-05-04 ENCOUNTER — Ambulatory Visit: Payer: Self-pay | Admitting: Family Medicine

## 2009-05-04 ENCOUNTER — Encounter: Payer: Self-pay | Admitting: Internal Medicine

## 2009-05-04 LAB — CMP (CANCER CENTER ONLY)
AST: 35 U/L (ref 11–38)
Alkaline Phosphatase: 59 U/L (ref 26–84)
BUN, Bld: 12 mg/dL (ref 7–22)
Creat: 0.7 mg/dl (ref 0.6–1.2)
Total Bilirubin: 0.5 mg/dl (ref 0.20–1.60)

## 2009-05-04 LAB — CBC WITH DIFFERENTIAL (CANCER CENTER ONLY)
BASO#: 0 10*3/uL (ref 0.0–0.2)
BASO%: 0.4 % (ref 0.0–2.0)
Eosinophils Absolute: 0.2 10*3/uL (ref 0.0–0.5)
HCT: 34.7 % — ABNORMAL LOW (ref 34.8–46.6)
HGB: 11.4 g/dL — ABNORMAL LOW (ref 11.6–15.9)
LYMPH%: 24 % (ref 14.0–48.0)
MCV: 96 fL (ref 81–101)
MONO#: 0.5 10*3/uL (ref 0.1–0.9)
NEUT%: 63.9 % (ref 39.6–80.0)
RDW: 14.4 % (ref 10.5–14.6)
WBC: 6.3 10*3/uL (ref 3.9–10.0)

## 2009-05-10 LAB — CBC WITH DIFFERENTIAL (CANCER CENTER ONLY)
BASO#: 0 10*3/uL (ref 0.0–0.2)
BASO%: 0.6 % (ref 0.0–2.0)
EOS%: 3.6 % (ref 0.0–7.0)
HCT: 36.2 % (ref 34.8–46.6)
LYMPH%: 31.9 % (ref 14.0–48.0)
MCH: 31.9 pg (ref 26.0–34.0)
MCHC: 33.1 g/dL (ref 32.0–36.0)
MCV: 96 fL (ref 81–101)
NEUT%: 55.7 % (ref 39.6–80.0)
RDW: 13.3 % (ref 10.5–14.6)

## 2009-05-10 LAB — BASIC METABOLIC PANEL - CANCER CENTER ONLY
BUN, Bld: 10 mg/dL (ref 7–22)
CO2: 26 mEq/L (ref 18–33)
Chloride: 106 mEq/L (ref 98–108)
Glucose, Bld: 95 mg/dL (ref 73–118)
Potassium: 3.8 mEq/L (ref 3.3–4.7)

## 2009-05-13 ENCOUNTER — Ambulatory Visit: Payer: Self-pay | Admitting: Internal Medicine

## 2009-05-13 DIAGNOSIS — R0602 Shortness of breath: Secondary | ICD-10-CM | POA: Insufficient documentation

## 2009-05-16 ENCOUNTER — Telehealth: Payer: Self-pay | Admitting: Internal Medicine

## 2009-05-16 ENCOUNTER — Encounter: Admission: RE | Admit: 2009-05-16 | Discharge: 2009-06-02 | Payer: Self-pay | Admitting: Oncology

## 2009-05-18 LAB — CBC WITH DIFFERENTIAL (CANCER CENTER ONLY)
BASO%: 0.6 % (ref 0.0–2.0)
EOS%: 2.7 % (ref 0.0–7.0)
LYMPH#: 1.3 10*3/uL (ref 0.9–3.3)
MCH: 32 pg (ref 26.0–34.0)
MCHC: 33.5 g/dL (ref 32.0–36.0)
MONO%: 8.7 % (ref 0.0–13.0)
NEUT#: 2.7 10*3/uL (ref 1.5–6.5)
NEUT%: 59.7 % (ref 39.6–80.0)
RDW: 12.5 % (ref 10.5–14.6)

## 2009-05-18 LAB — CMP (CANCER CENTER ONLY)
Albumin: 3.6 g/dL (ref 3.3–5.5)
Alkaline Phosphatase: 80 U/L (ref 26–84)
BUN, Bld: 10 mg/dL (ref 7–22)
Glucose, Bld: 91 mg/dL (ref 73–118)
Potassium: 4.1 mEq/L (ref 3.3–4.7)
Total Bilirubin: 0.6 mg/dl (ref 0.20–1.60)

## 2009-06-01 LAB — CBC WITH DIFFERENTIAL (CANCER CENTER ONLY)
Eosinophils Absolute: 0.1 10*3/uL (ref 0.0–0.5)
MONO#: 0.4 10*3/uL (ref 0.1–0.9)
MONO%: 9.5 % (ref 0.0–13.0)
NEUT#: 2.1 10*3/uL (ref 1.5–6.5)
Platelets: 199 10*3/uL (ref 145–400)
RBC: 4.04 10*6/uL (ref 3.70–5.32)
WBC: 3.8 10*3/uL — ABNORMAL LOW (ref 3.9–10.0)

## 2009-06-01 LAB — CMP (CANCER CENTER ONLY)
AST: 28 U/L (ref 11–38)
Albumin: 3.7 g/dL (ref 3.3–5.5)
Alkaline Phosphatase: 99 U/L — ABNORMAL HIGH (ref 26–84)
Chloride: 101 mEq/L (ref 98–108)
Potassium: 3.8 mEq/L (ref 3.3–4.7)
Sodium: 143 mEq/L (ref 128–145)
Total Protein: 7.4 g/dL (ref 6.4–8.1)

## 2009-07-08 LAB — HM COLONOSCOPY

## 2009-07-11 ENCOUNTER — Ambulatory Visit (HOSPITAL_COMMUNITY): Admission: RE | Admit: 2009-07-11 | Discharge: 2009-07-11 | Payer: Self-pay | Admitting: Obstetrics and Gynecology

## 2009-07-21 ENCOUNTER — Ambulatory Visit: Payer: Self-pay | Admitting: Oncology

## 2009-07-26 LAB — CBC WITH DIFFERENTIAL (CANCER CENTER ONLY)
Eosinophils Absolute: 0.1 10*3/uL (ref 0.0–0.5)
HCT: 37.8 % (ref 34.8–46.6)
LYMPH%: 37.4 % (ref 14.0–48.0)
MCV: 90 fL (ref 81–101)
MONO#: 0.2 10*3/uL (ref 0.1–0.9)
Platelets: 268 10*3/uL (ref 145–400)
RBC: 4.21 10*6/uL (ref 3.70–5.32)
WBC: 4.3 10*3/uL (ref 3.9–10.0)

## 2009-07-26 LAB — CMP (CANCER CENTER ONLY)
ALT(SGPT): 32 U/L (ref 10–47)
AST: 29 U/L (ref 11–38)
Albumin: 3.4 g/dL (ref 3.3–5.5)
Alkaline Phosphatase: 88 U/L — ABNORMAL HIGH (ref 26–84)
Potassium: 3.8 mEq/L (ref 3.3–4.7)
Sodium: 136 mEq/L (ref 128–145)
Total Protein: 7.3 g/dL (ref 6.4–8.1)

## 2009-07-28 ENCOUNTER — Ambulatory Visit (HOSPITAL_COMMUNITY): Admission: RE | Admit: 2009-07-28 | Discharge: 2009-07-28 | Payer: Self-pay | Admitting: Oncology

## 2009-08-24 ENCOUNTER — Ambulatory Visit: Payer: Self-pay | Admitting: Oncology

## 2009-08-26 ENCOUNTER — Encounter: Admission: RE | Admit: 2009-08-26 | Discharge: 2009-08-26 | Payer: Self-pay | Admitting: Oncology

## 2009-09-22 ENCOUNTER — Ambulatory Visit (HOSPITAL_BASED_OUTPATIENT_CLINIC_OR_DEPARTMENT_OTHER): Admission: RE | Admit: 2009-09-22 | Discharge: 2009-09-22 | Payer: Self-pay | Admitting: Surgery

## 2009-10-04 LAB — HM PAP SMEAR: HM Pap smear: NEGATIVE

## 2009-10-17 ENCOUNTER — Ambulatory Visit: Payer: Self-pay | Admitting: Family Medicine

## 2009-11-17 ENCOUNTER — Ambulatory Visit: Payer: Self-pay | Admitting: Oncology

## 2009-11-24 LAB — CBC WITH DIFFERENTIAL (CANCER CENTER ONLY)
BASO#: 0 10*3/uL (ref 0.0–0.2)
EOS%: 1.4 % (ref 0.0–7.0)
Eosinophils Absolute: 0.1 10*3/uL (ref 0.0–0.5)
HCT: 37.6 % (ref 34.8–46.6)
HGB: 12.8 g/dL (ref 11.6–15.9)
MCH: 31 pg (ref 26.0–34.0)
MCHC: 34.1 g/dL (ref 32.0–36.0)
MCV: 91 fL (ref 81–101)
MONO%: 4.4 % (ref 0.0–13.0)
NEUT#: 2.8 10*3/uL (ref 1.5–6.5)
NEUT%: 53.7 % (ref 39.6–80.0)
RBC: 4.14 10*6/uL (ref 3.70–5.32)

## 2009-11-24 LAB — CMP (CANCER CENTER ONLY)
Albumin: 3.8 g/dL (ref 3.3–5.5)
Alkaline Phosphatase: 101 U/L — ABNORMAL HIGH (ref 26–84)
BUN, Bld: 12 mg/dL (ref 7–22)
Calcium: 9.4 mg/dL (ref 8.0–10.3)
Creat: 0.7 mg/dl (ref 0.6–1.2)
Glucose, Bld: 121 mg/dL — ABNORMAL HIGH (ref 73–118)
Potassium: 3.6 mEq/L (ref 3.3–4.7)

## 2009-11-29 ENCOUNTER — Ambulatory Visit: Payer: Self-pay | Admitting: Family Medicine

## 2009-11-29 ENCOUNTER — Encounter: Admission: RE | Admit: 2009-11-29 | Discharge: 2009-11-29 | Payer: Self-pay | Admitting: Radiation Oncology

## 2009-12-06 ENCOUNTER — Encounter: Admission: RE | Admit: 2009-12-06 | Discharge: 2009-12-06 | Payer: Self-pay | Admitting: Obstetrics and Gynecology

## 2009-12-07 ENCOUNTER — Ambulatory Visit: Payer: Self-pay | Admitting: Family Medicine

## 2010-01-10 ENCOUNTER — Ambulatory Visit: Payer: Self-pay | Admitting: Physician Assistant

## 2010-03-08 ENCOUNTER — Ambulatory Visit: Payer: Self-pay | Admitting: Physician Assistant

## 2010-03-08 ENCOUNTER — Encounter: Admission: RE | Admit: 2010-03-08 | Discharge: 2010-03-08 | Payer: Self-pay | Admitting: Family Medicine

## 2010-03-20 ENCOUNTER — Ambulatory Visit: Payer: Self-pay | Admitting: Physician Assistant

## 2010-03-21 ENCOUNTER — Ambulatory Visit (HOSPITAL_COMMUNITY): Admission: RE | Admit: 2010-03-21 | Discharge: 2010-03-21 | Payer: Self-pay | Admitting: Oncology

## 2010-04-07 ENCOUNTER — Ambulatory Visit: Payer: Self-pay | Admitting: Oncology

## 2010-04-25 LAB — MANUAL DIFFERENTIAL (CHCC SATELLITE)
ALC: 1.8 10*3/uL (ref 0.6–2.2)
Band Neutrophils: 1 % (ref 0–10)
Eos: 9 % — ABNORMAL HIGH (ref 0–7)
LYMPH: 38 % (ref 14–48)
MONO: 3 % (ref 0–13)
PLT EST ~~LOC~~: ADEQUATE
SEG: 46 % (ref 40–75)

## 2010-04-25 LAB — CMP (CANCER CENTER ONLY)
AST: 28 U/L (ref 11–38)
Albumin: 3.6 g/dL (ref 3.3–5.5)
Alkaline Phosphatase: 115 U/L — ABNORMAL HIGH (ref 26–84)
BUN, Bld: 9 mg/dL (ref 7–22)
Creat: 0.8 mg/dl (ref 0.6–1.2)
Glucose, Bld: 100 mg/dL (ref 73–118)
Potassium: 3.8 mEq/L (ref 3.3–4.7)
Total Bilirubin: 0.6 mg/dl (ref 0.20–1.60)

## 2010-04-25 LAB — CBC WITH DIFFERENTIAL (CANCER CENTER ONLY)
HCT: 39.3 % (ref 34.8–46.6)
HGB: 13.2 g/dL (ref 11.6–15.9)
MCH: 31.1 pg (ref 26.0–34.0)
MCHC: 33.6 g/dL (ref 32.0–36.0)
RDW: 11.5 % (ref 10.5–14.6)

## 2010-06-12 ENCOUNTER — Ambulatory Visit: Payer: Self-pay | Admitting: Family Medicine

## 2010-08-19 ENCOUNTER — Encounter: Payer: Self-pay | Admitting: General Surgery

## 2010-08-20 ENCOUNTER — Encounter: Payer: Self-pay | Admitting: Neurosurgery

## 2010-08-20 ENCOUNTER — Encounter: Payer: Self-pay | Admitting: Obstetrics and Gynecology

## 2010-08-20 ENCOUNTER — Encounter: Payer: Self-pay | Admitting: Physical Medicine and Rehabilitation

## 2010-08-20 ENCOUNTER — Encounter: Payer: Self-pay | Admitting: Surgery

## 2010-08-20 ENCOUNTER — Encounter: Payer: Self-pay | Admitting: Diagnostic Radiology

## 2010-08-21 ENCOUNTER — Encounter: Payer: Self-pay | Admitting: Neurosurgery

## 2010-08-21 ENCOUNTER — Encounter: Payer: Self-pay | Admitting: Family Medicine

## 2010-08-27 LAB — CONVERTED CEMR LAB
BUN: 14 mg/dL (ref 6–23)
CO2: 28 meq/L (ref 19–32)
Calcium: 9.1 mg/dL (ref 8.4–10.5)
GFR calc non Af Amer: 173.4 mL/min (ref >60)
Glucose, Bld: 89 mg/dL (ref 70–99)
Potassium: 3.8 meq/L (ref 3.5–5.1)
Sodium: 137 meq/L (ref 135–145)

## 2010-08-28 ENCOUNTER — Ambulatory Visit
Admission: RE | Admit: 2010-08-28 | Discharge: 2010-08-28 | Payer: Self-pay | Source: Home / Self Care | Attending: Family Medicine | Admitting: Family Medicine

## 2010-08-28 ENCOUNTER — Encounter
Admission: RE | Admit: 2010-08-28 | Discharge: 2010-08-28 | Payer: Self-pay | Source: Home / Self Care | Attending: Radiation Oncology | Admitting: Radiation Oncology

## 2010-08-28 ENCOUNTER — Encounter
Admission: RE | Admit: 2010-08-28 | Discharge: 2010-08-28 | Payer: Self-pay | Source: Home / Self Care | Attending: Family Medicine | Admitting: Family Medicine

## 2010-10-12 ENCOUNTER — Other Ambulatory Visit: Payer: Self-pay | Admitting: Obstetrics and Gynecology

## 2010-10-18 LAB — POCT HEMOGLOBIN-HEMACUE: Hemoglobin: 12.5 g/dL (ref 12.0–15.0)

## 2010-10-24 ENCOUNTER — Other Ambulatory Visit: Payer: Self-pay | Admitting: Oncology

## 2010-10-24 ENCOUNTER — Encounter (HOSPITAL_BASED_OUTPATIENT_CLINIC_OR_DEPARTMENT_OTHER): Payer: PRIVATE HEALTH INSURANCE | Admitting: Oncology

## 2010-10-24 DIAGNOSIS — Z171 Estrogen receptor negative status [ER-]: Secondary | ICD-10-CM

## 2010-10-24 DIAGNOSIS — C50419 Malignant neoplasm of upper-outer quadrant of unspecified female breast: Secondary | ICD-10-CM

## 2010-10-24 LAB — COMPREHENSIVE METABOLIC PANEL
ALT: 15 U/L (ref 0–35)
Albumin: 4.4 g/dL (ref 3.5–5.2)
CO2: 27 mEq/L (ref 19–32)
Calcium: 9.5 mg/dL (ref 8.4–10.5)
Chloride: 102 mEq/L (ref 96–112)
Potassium: 4.2 mEq/L (ref 3.5–5.3)
Sodium: 139 mEq/L (ref 135–145)
Total Protein: 7.8 g/dL (ref 6.0–8.3)

## 2010-10-24 LAB — CBC WITH DIFFERENTIAL/PLATELET
BASO%: 0.2 % (ref 0.0–2.0)
Eosinophils Absolute: 0.1 10*3/uL (ref 0.0–0.5)
HCT: 39.2 % (ref 34.8–46.6)
MCHC: 34.7 g/dL (ref 31.5–36.0)
MONO#: 0.5 10*3/uL (ref 0.1–0.9)
NEUT#: 2.9 10*3/uL (ref 1.5–6.5)
RBC: 4.5 10*6/uL (ref 3.70–5.45)
WBC: 6 10*3/uL (ref 3.9–10.3)
lymph#: 2.6 10*3/uL (ref 0.9–3.3)

## 2010-10-31 LAB — CBC
MCV: 91.3 fL (ref 78.0–100.0)
Platelets: 213 10*3/uL (ref 150–400)
WBC: 4.3 10*3/uL (ref 4.0–10.5)

## 2010-10-31 LAB — PREGNANCY, URINE: Preg Test, Ur: NEGATIVE

## 2010-11-02 LAB — BASIC METABOLIC PANEL
BUN: 7 mg/dL (ref 6–23)
BUN: 9 mg/dL (ref 6–23)
CO2: 26 mEq/L (ref 19–32)
CO2: 27 mEq/L (ref 19–32)
Calcium: 8.7 mg/dL (ref 8.4–10.5)
Calcium: 8.9 mg/dL (ref 8.4–10.5)
Chloride: 107 mEq/L (ref 96–112)
Creatinine, Ser: 0.68 mg/dL (ref 0.4–1.2)
Creatinine, Ser: 0.76 mg/dL (ref 0.4–1.2)
GFR calc Af Amer: >60 mL/min (ref >60)
GFR calc non Af Amer: >60 mL/min (ref >60)
Glucose, Bld: 108 mg/dL — ABNORMAL HIGH (ref 70–99)

## 2010-11-02 LAB — TSH: TSH: 1.454 u[IU]/mL (ref 0.350–4.500)

## 2010-11-02 LAB — URINE CULTURE: Colony Count: 2000

## 2010-11-02 LAB — URINALYSIS, ROUTINE W REFLEX MICROSCOPIC
Bilirubin Urine: NEGATIVE
Ketones, ur: NEGATIVE mg/dL
Nitrite: NEGATIVE
Protein, ur: NEGATIVE mg/dL
Urobilinogen, UA: 0.2 mg/dL (ref 0.0–1.0)

## 2010-11-02 LAB — DIFFERENTIAL
Basophils Relative: 2 % — ABNORMAL HIGH (ref 0–1)
Eosinophils Absolute: 0.2 10*3/uL (ref 0.0–0.7)
Monocytes Absolute: 0.9 10*3/uL (ref 0.1–1.0)
Monocytes Relative: 11 % (ref 3–12)
Neutrophils Relative %: 65 % (ref 43–77)

## 2010-11-02 LAB — CBC
Hemoglobin: 11.3 g/dL — ABNORMAL LOW (ref 12.0–15.0)
RBC: 3.39 MIL/uL — ABNORMAL LOW (ref 3.87–5.11)
RDW: 18.3 % — ABNORMAL HIGH (ref 11.5–15.5)
WBC: 8.4 10*3/uL (ref 4.0–10.5)

## 2010-11-02 LAB — COMPREHENSIVE METABOLIC PANEL
ALT: 21 U/L (ref 0–35)
Alkaline Phosphatase: 55 U/L (ref 39–117)
Glucose, Bld: 100 mg/dL — ABNORMAL HIGH (ref 70–99)
Potassium: 3.6 mEq/L (ref 3.5–5.1)
Sodium: 137 mEq/L (ref 135–145)
Total Protein: 5.1 g/dL — ABNORMAL LOW (ref 6.0–8.3)

## 2010-11-02 LAB — PROTEIN, URINE, RANDOM: Total Protein, Urine: <6 mg/dL

## 2010-11-02 LAB — CREATININE, URINE, RANDOM: Creatinine, Urine: 60.2 mg/dL

## 2010-11-02 LAB — URINE MICROSCOPIC-ADD ON

## 2010-11-03 LAB — DIFFERENTIAL
Basophils Absolute: 0 K/uL (ref 0.0–0.1)
Basophils Absolute: 0.1 K/uL (ref 0.0–0.1)
Basophils Relative: 0 % (ref 0–1)
Basophils Relative: 1 % (ref 0–1)
Eosinophils Absolute: 0 K/uL (ref 0.0–0.7)
Eosinophils Absolute: 0 K/uL (ref 0.0–0.7)
Eosinophils Relative: 0 % (ref 0–5)
Eosinophils Relative: 0 % (ref 0–5)
Lymphocytes Relative: 13 % (ref 12–46)
Lymphocytes Relative: 9 % — ABNORMAL LOW (ref 12–46)
Lymphs Abs: 1.3 K/uL (ref 0.7–4.0)
Lymphs Abs: 1.9 K/uL (ref 0.7–4.0)
Monocytes Absolute: 0.1 K/uL (ref 0.1–1.0)
Monocytes Absolute: 0.6 K/uL (ref 0.1–1.0)
Monocytes Relative: 1 % — ABNORMAL LOW (ref 3–12)
Monocytes Relative: 4 % (ref 3–12)
Neutro Abs: 12.6 K/uL — ABNORMAL HIGH (ref 1.7–7.7)
Neutro Abs: 12.7 K/uL — ABNORMAL HIGH (ref 1.7–7.7)
Neutrophils Relative %: 83 % — ABNORMAL HIGH (ref 43–77)
Neutrophils Relative %: 89 % — ABNORMAL HIGH (ref 43–77)

## 2010-11-03 LAB — BASIC METABOLIC PANEL
BUN: 6 mg/dL (ref 6–23)
Calcium: 8.2 mg/dL — ABNORMAL LOW (ref 8.4–10.5)
Creatinine, Ser: 0.64 mg/dL (ref 0.4–1.2)
GFR calc non Af Amer: >60 mL/min (ref >60)

## 2010-11-03 LAB — URINALYSIS, ROUTINE W REFLEX MICROSCOPIC
Bilirubin Urine: NEGATIVE
Glucose, UA: NEGATIVE mg/dL
Hgb urine dipstick: NEGATIVE
Ketones, ur: NEGATIVE mg/dL
Nitrite: NEGATIVE
Protein, ur: NEGATIVE mg/dL
Specific Gravity, Urine: 1.027 (ref 1.005–1.030)
Urobilinogen, UA: 0.2 mg/dL (ref 0.0–1.0)
pH: 5 (ref 5.0–8.0)

## 2010-11-03 LAB — CBC
HCT: 33 % — ABNORMAL LOW (ref 36.0–46.0)
HCT: 33.1 % — ABNORMAL LOW (ref 36.0–46.0)
Hemoglobin: 10.1 g/dL — ABNORMAL LOW (ref 12.0–15.0)
Hemoglobin: 11.2 g/dL — ABNORMAL LOW (ref 12.0–15.0)
MCHC: 33.9 g/dL (ref 30.0–36.0)
MCV: 96.7 fL (ref 78.0–100.0)
Platelets: 167 10*3/uL (ref 150–400)
Platelets: 181 K/uL (ref 150–400)
Platelets: 184 10*3/uL (ref 150–400)
RBC: 3.41 MIL/uL — ABNORMAL LOW (ref 3.87–5.11)
RBC: 3.42 MIL/uL — ABNORMAL LOW (ref 3.87–5.11)
RDW: 16.8 % — ABNORMAL HIGH (ref 11.5–15.5)
RDW: 17.2 % — ABNORMAL HIGH (ref 11.5–15.5)
WBC: 14.2 K/uL — ABNORMAL HIGH (ref 4.0–10.5)
WBC: 15.3 10*3/uL — ABNORMAL HIGH (ref 4.0–10.5)

## 2010-11-03 LAB — COMPREHENSIVE METABOLIC PANEL WITH GFR
ALT: 22 U/L (ref 0–35)
AST: 29 U/L (ref 0–37)
Albumin: 2.8 g/dL — ABNORMAL LOW (ref 3.5–5.2)
Alkaline Phosphatase: 70 U/L (ref 39–117)
BUN: 8 mg/dL (ref 6–23)
CO2: 24 meq/L (ref 19–32)
Calcium: 8.3 mg/dL — ABNORMAL LOW (ref 8.4–10.5)
Chloride: 107 meq/L (ref 96–112)
Creatinine, Ser: 0.76 mg/dL (ref 0.4–1.2)
GFR calc non Af Amer: >60 mL/min
Glucose, Bld: 98 mg/dL (ref 70–99)
Potassium: 3.5 meq/L (ref 3.5–5.1)
Sodium: 138 meq/L (ref 135–145)
Total Bilirubin: 0.3 mg/dL (ref 0.3–1.2)
Total Protein: 5.3 g/dL — ABNORMAL LOW (ref 6.0–8.3)

## 2010-11-03 LAB — URINE MICROSCOPIC-ADD ON

## 2010-11-03 LAB — BASIC METABOLIC PANEL WITH GFR
BUN: 4 mg/dL — ABNORMAL LOW (ref 6–23)
CO2: 24 meq/L (ref 19–32)
Calcium: 8.4 mg/dL (ref 8.4–10.5)
Chloride: 112 meq/L (ref 96–112)
Creatinine, Ser: 0.61 mg/dL (ref 0.4–1.2)
GFR calc non Af Amer: >60 mL/min
Glucose, Bld: 110 mg/dL — ABNORMAL HIGH (ref 70–99)
Potassium: 4.2 meq/L (ref 3.5–5.1)
Sodium: 140 meq/L (ref 135–145)

## 2010-11-03 LAB — CARDIAC PANEL(CRET KIN+CKTOT+MB+TROPI)
CK, MB: 0.9 ng/mL (ref 0.3–4.0)
Relative Index: INVALID (ref 0.0–2.5)
Relative Index: INVALID (ref 0.0–2.5)
Total CK: 40 U/L (ref 7–177)
Total CK: 42 U/L (ref 7–177)
Total CK: 44 U/L (ref 7–177)

## 2010-11-03 LAB — TSH: TSH: 1.581 u[IU]/mL (ref 0.350–4.500)

## 2010-11-03 LAB — COMPREHENSIVE METABOLIC PANEL
ALT: 23 U/L (ref 0–35)
AST: 30 U/L (ref 0–37)
Alkaline Phosphatase: 65 U/L (ref 39–117)
CO2: 23 mEq/L (ref 19–32)
Chloride: 109 mEq/L (ref 96–112)
GFR calc Af Amer: >60 mL/min (ref >60)
GFR calc non Af Amer: >60 mL/min (ref >60)
Potassium: 3.5 mEq/L (ref 3.5–5.1)
Sodium: 137 mEq/L (ref 135–145)
Total Bilirubin: 0.5 mg/dL (ref 0.3–1.2)

## 2010-11-03 LAB — IRON AND TIBC: Saturation Ratios: 26 % (ref 20–55)

## 2010-11-03 LAB — D-DIMER, QUANTITATIVE

## 2010-11-03 LAB — T4, FREE: Free T4: 0.87 ng/dL (ref 0.80–1.80)

## 2010-11-03 LAB — BRAIN NATRIURETIC PEPTIDE: Pro B Natriuretic peptide (BNP): <30 pg/mL (ref 0.0–100.0)

## 2010-11-03 LAB — VITAMIN B12: Vitamin B-12: 730 pg/mL (ref 211–911)

## 2010-11-03 LAB — RETICULOCYTES
RBC.: 2.99 MIL/uL — ABNORMAL LOW (ref 3.87–5.11)
Retic Count, Absolute: 101.7 10*3/uL (ref 19.0–186.0)
Retic Ct Pct: 3.4 % — ABNORMAL HIGH (ref 0.4–3.1)

## 2010-11-03 LAB — PHOSPHORUS: Phosphorus: 4.8 mg/dL — ABNORMAL HIGH (ref 2.3–4.6)

## 2010-11-03 LAB — FOLATE: Folate: 15.1 ng/mL

## 2010-11-03 LAB — MAGNESIUM: Magnesium: 1.9 mg/dL (ref 1.5–2.5)

## 2010-11-08 LAB — CBC
MCV: 90.6 fL (ref 78.0–100.0)
Platelets: 228 10*3/uL (ref 150–400)
WBC: 3.3 10*3/uL — ABNORMAL LOW (ref 4.0–10.5)

## 2010-11-09 LAB — COMPREHENSIVE METABOLIC PANEL
ALT: 55 U/L — ABNORMAL HIGH (ref 0–35)
Albumin: 4 g/dL (ref 3.5–5.2)
Alkaline Phosphatase: 79 U/L (ref 39–117)
Glucose, Bld: 100 mg/dL — ABNORMAL HIGH (ref 70–99)
Potassium: 3.6 mEq/L (ref 3.5–5.1)
Sodium: 138 mEq/L (ref 135–145)
Total Protein: 7.9 g/dL (ref 6.0–8.3)

## 2010-11-09 LAB — DIFFERENTIAL
Basophils Relative: 1 % (ref 0–1)
Eosinophils Absolute: 0.2 10*3/uL (ref 0.0–0.7)
Eosinophils Relative: 2 % (ref 0–5)
Monocytes Absolute: 0.5 10*3/uL (ref 0.1–1.0)
Monocytes Relative: 6 % (ref 3–12)

## 2010-11-09 LAB — CBC
Hemoglobin: 15 g/dL (ref 12.0–15.0)
Platelets: 306 10*3/uL (ref 150–400)
RDW: 11.5 % (ref 11.5–15.5)

## 2010-11-09 LAB — GLUCOSE, CAPILLARY: Glucose-Capillary: 89 mg/dL (ref 70–99)

## 2010-11-14 LAB — COMPREHENSIVE METABOLIC PANEL
AST: 29 U/L (ref 0–37)
Albumin: 3.6 g/dL (ref 3.5–5.2)
Calcium: 8.9 mg/dL (ref 8.4–10.5)
Creatinine, Ser: 0.7 mg/dL (ref 0.4–1.2)
GFR calc Af Amer: >60 mL/min (ref >60)
GFR calc non Af Amer: >60 mL/min (ref >60)

## 2010-11-14 LAB — DIFFERENTIAL
Eosinophils Relative: 5 % (ref 0–5)
Lymphocytes Relative: 39 % (ref 12–46)
Lymphs Abs: 2.2 10*3/uL (ref 0.7–4.0)
Monocytes Absolute: 0.5 10*3/uL (ref 0.1–1.0)
Neutro Abs: 2.8 10*3/uL (ref 1.7–7.7)

## 2010-11-14 LAB — CBC
MCHC: 35.3 g/dL (ref 30.0–36.0)
MCV: 91.5 fL (ref 78.0–100.0)
Platelets: 322 10*3/uL (ref 150–400)

## 2010-11-30 ENCOUNTER — Encounter (INDEPENDENT_AMBULATORY_CARE_PROVIDER_SITE_OTHER): Payer: Self-pay | Admitting: Surgery

## 2010-12-12 ENCOUNTER — Other Ambulatory Visit: Payer: Self-pay | Admitting: Family Medicine

## 2010-12-12 NOTE — Op Note (Signed)
[name], [name]                ACCOUNT NO.:  [id number]   MEDICAL RECORD NO.:  [id number]          PATIENT TYPE:  AMB   LOCATION:  SDS                          FACILITY:  [facility]   PHYSICIAN:  Reinaldo Meeker, M.D. DATE OF BIRTH:  [birth year]   DATE OF PROCEDURE:  01/10/2007  DATE OF DISCHARGE:                               OPERATIVE REPORT   PREOPERATIVE DIAGNOSIS:  Herniated disk L5-S1 left, recurrent.   POSTOPERATIVE DIAGNOSIS:  Herniated disk L5-S1 left, recurrent.   OPERATION/PROCEDURE:  Left L5-S1 redo microdiskectomy.   SURGEON:  Reinaldo Meeker, M.D.   ASSISTANT:  Tia Alert, MD.   PROCEDURE IN DETAIL:  After placed in the prone position, the patient's  back was prepped and draped in usual sterile fashion.  Previous lumbar  incision was opened and carried down to the spinous processes.  Subperiosteal dissection then carried out on left-sided spinous  processes, lamina and facet joint.  Self-retaining retractor was placed  for exposure.  X-rays showed approach to the appropriate levels.  Edges  of the previous laminotomy were identified and then laminotomy was  enlarged in all directions.  At the nerve root was one of the pedicles.  These were easily identified.  Some of the hypertrophic scar was removed  to help with visualization.  The microscope was draped, brought into the  field,  used the remainder of the case.  Using microsection technique,  the lateral aspect of thecal sac was identified.  The nerve root was  found and dissected free of the pedicle.  Dissection was then carried up  superior towards the disk space and the obvious disk herniation was  identified.  The small capsule around it was incised with 15 blade and  then three very large fragments of disk material were removed which gave  excellent decompression.  The disk space was then thoroughly cleaned out  once again until there was no evidence of residual disk material and  residual compression.   At this time, inspection was carried out in all  directions for any evidence of residual compression and none could be  identified.  Large amounts irrigation were carried out.  Any bleeding  was controlled bipolar coagulation and Gelfoam.  The wound was then  closed in multiple layers of Vicryl in the muscle, fascia, subcutaneous  and subcuticular issues and staples were placed on the skin.  A sterile  dressing was then applied.  The patient was extubated, taken to the  recovery room in stable condition.           ______________________________  Reinaldo Meeker, M.D.     ROK/MEDQ  D:  01/10/2007  T:  01/10/2007  Job:  [id number]

## 2010-12-12 NOTE — Telephone Encounter (Signed)
Call [facility] left message for meds nexium 40 mg #30 with 6 refills

## 2010-12-12 NOTE — Op Note (Signed)
NAMEJAKYLA, REZA                ACCOUNT NO.:  [id number]   MEDICAL RECORD NO.:  [id number]           PATIENT TYPE:   LOCATION:                                 FACILITY:   PHYSICIAN:  Thomas A. Cornett, M.D.DATE OF BIRTH:  1965/08/02   DATE OF PROCEDURE:  09/10/2008  DATE OF DISCHARGE:                               OPERATIVE REPORT   PREOPERATIVE DIAGNOSES:  1. T2 N0 Mx right breast cancer.  2. Right breast fibroadenoma.   POSTOPERATIVE DIAGNOSES:  1. T2 N0 Mx right breast cancer.  2. Right breast fibroadenoma.   PROCEDURE:  1. Right breast needle-localized lumpectomy x3.  2. Right axillary sentinel lymph node mapping.   SURGEON:  Maisie Fus A. Cornett, MD   ANESTHESIA:  General endotracheal anesthesia with 0.25% Sensorcaine  local.   ESTIMATED BLOOD LOSS:  60 mL.   SPECIMEN:  1. Right breast mass, right upper outer quadrant x2.  2. Right central breast mass.  3. Right axillary sentinel lymph node x1, negative by touch.   DRAINS:  None.   INDICATIONS FOR PROCEDURE:  The patient is a 45 year old female who  presented with a right breast mass on mammography.  She actually had 3  masses, 2 in the right upper outer quadrant, and one in the right  central breast, all verified by MRI.  There was a right upper outer  quadrant breast mass, cold-biopsy proven to be invasive ductal  carcinoma.  There was an adjacent mass that was not biopsied to it and a  third mass in the central breast, which was actually biopsied by MRI ,  imaged properly and found to be a fibroadenoma.  She wished to conserve  her breast and presents today for breast conserving measures.  She also  wanted to have the fibroadenoma excised in the same setting as her  breast cancer surgery.  We discussed the options and felt this was  possible since the right upper outer quadrant breast cancer was separate  and the adjacent mass was just next to it.  She presents today for her  right breast needle-localization  excisional lumpectomy and right  sentinel lymph node mapping.   DESCRIPTION OF PROCEDURE:  The patient was brought to the operating room  after undergoing wire localization x3.  She also underwent sentinel  lymph node injection as well.  Unfortunately, she required a central  line since her IV access was poor.  This was placed by Anesthesia.  After all this was done, she was taken back to the operating room.  After induction of general anesthesia, the right breast was then  injected with 4 mL of methylene blue dye in the subareolar position and  massaged for a minute.  We then prepped and draped her right breast,  after trimming her wires.  Sentinel node was done first.  NEO-prep was  used and hot spot was identified in the right axilla.  Incision was made  at the right axilla and dissection was carried down.  I found a blue hot  node in the level 1 axillary nodes.  This was sent to  pathology.  There  are no other lymph nodes that were hot or blue in the axilla.  We  performed the lumpectomy in the right upper outer quadrant.  A  curvilinear incision was made between the 2 localizing wires, which were  about a cm apart on the skin.  We then had both wires out of the  incision and took all tissue in between both wires.  Both wires with 2  masses with the lower more inferior wire going through the breast  cancer.  We excised all of this and the superior mass was easily  palpable in the superior portion of the tissue specimen.  Once, I took  this all out and oriented it, I sent it for radiograph.  I took an  additional superior margin ;  I felt this was close to the more superior  mass which was unknown.  I took this all the way down to the fascia.  This was then radiographed and felt to be adequate after discussion with  the radiology.  We then used an inferior curvilinear incision where the  central lesion was which exited the inferior portion of the breast.  This lesion was excised.  The  clip actually fell out of the specimen, so  I found this in the wound and put it with the specimen.  I took some  additional tissue around that area, but it looked like the entire mass  was intact and actually it was easily palpable and felt to be a  fibroadenoma.  Irrigation was used in all 3 cavities.  Hemostasis was  achieved.  Surgicel was placed in the right axillary wound in the  axilla.  We then closed all 3 wounds with combination of 3-0 Vicryl and  4-0 Monocryl subcuticular stitches.  All final counts of sponge, needle,  and instruments were found to be correct at this portion of the case.  Dermabond was applied.  All final counts were correct.  The patient was  awoken and taken to the recovery room in satisfactory condition.  All  final counts were counted, and found to be correct.      Thomas A. Cornett, M.D.  Electronically Signed     TAC/MEDQ  D:  09/17/2008  T:  09/18/2008  Job:  [id number]   cc:   Sherry A. Rosalio Macadamia, M.D.  [facility]

## 2010-12-12 NOTE — Op Note (Signed)
NAMEBARBARANN, Olivia [name]                ACCOUNT NO.:  [id number]   MEDICAL RECORD NO.:  [id number]          PATIENT TYPE:  AMB   LOCATION:  DAY                          [facility]ACILITY:  [facility]   PHYSICIAN:  Thomas A. Cornett, M.D.DATE OF BIRTH:  [birth year]   DATE OF PROCEDURE:  10/06/2008  DATE OF DISCHARGE:                               OPERATIVE REPORT   PREOPERATIVE DIAGNOSIS:  A history of right breast cancer with poor  venous access.   POSTOPERATIVE DIAGNOSIS:  A history of right breast cancer with poor  venous access.   PROCEDURE:  Placement of right subclavian 8-French Power Port catheter  with fluoroscopy.   SURGEON:  Maisie Fus A. Cornett, M.D.   ANESTHESIA:  LMA with 0.25% Sensorcaine local.   EBL:  10 mL.   SPECIMENS:  None.   INDICATIONS FOR PROCEDURE:  The patient is a 45 year old female recently  diagnosed with right breast cancer.  She is in need of vascular access  for chemotherapy.  She presents today for Port-A-Cath placement after  discussion of the procedure with her in the office, the pros and cons of  using a Port-A-Cath, and of potential complications of bleeding,  infection, pneumothorax, hemothorax, pericardial tamponade, and injury  to the mediastinal structures.   DESCRIPTION OF PROCEDURE:  The patient was brought to the operating room  and placed supine.  Both arms were tucked and LMA anesthesia was  initiated.  The upper chest region was prepped and draped in a sterile  fashion after placing appropriate padding, tucking both arms, and  placing a roll under the shoulder blades.  After a sterile prep and  drape, she was placed in Trendelenburg and the right subclavian vein was  cannulated without difficulty, with return of dark non pulsatile blood.  A wire was fed through this.  Fluoroscopy showed the wire to be going  into the superior vena cava.  She was then flattened out.  A small  incision was made below this.  A small pocket was created with cautery  to  place the port itself.  The port was then brought onto the field.  It  was attached and flushed.  I then tunneled the port to the lower  incision to the wire exit site.  I then trimmed the port to about 15 cm.  The patient was then placed back in Trendelenburg.  I passed the dilator  over the wire, moving the wire to-and-fro without any resistance.  I  then put the dilator introducer complex over the wire and advanced this,  moving wire to-and-fro without any resistance.  Once the introducer was  in place, I removed the dilator and wire without difficulty.  I then  placed a catheter down in the introducer and then peeled away the peel-  away sheath, holding the catheter in place.  Fluoroscopy was then done,  which showed catheter in the distal superior vena cava at the junction  of the SVC and right atrium.  This drew back easily with dark blood.  It  flushed quite easily.  We placed 5 mL of 100 units/cc of  heparinized  saline at the catheter itself.  We then closed the incision with a  combination of a deep layer of 3-0 Vicryl  and subsequent 4-0 Monocryl stitch.  Dermabond was applied.  All final  counts of sponge, needle and instruments were found to be correct at  this portion of the case.  The patient was woken and taken to the  recovery room in satisfactory condition.  A chest x-ray will be  obtained.      Thomas A. Cornett, M.D.  Electronically Signed     TAC/MEDQ  D:  10/06/2008  T:  10/07/2008  Job:  [id number]   cc:   Pierce Crane, MD  Fax: [phone]   Jaci Standard

## 2010-12-15 NOTE — Discharge Summary (Signed)
NAMEALEANE, WESENBERG                ACCOUNT NO.:  [id number]   MEDICAL RECORD NO.:  [id number]          PATIENT TYPE:  INP   LOCATION:  6526                         FACILITY:  [facility]   PHYSICIAN:  Mobolaji B. Bakare, M.D.DATE OF BIRTH:  Jan 22, 1966   DATE OF ADMISSION:  06/18/2005  DATE OF DISCHARGE:  06/20/2005                                 DISCHARGE SUMMARY   PRIMARY CARE PHYSICIAN:  Casey Burkitt, M.D. at [facility] in  [place].   FINAL DIAGNOSES:  1.  Atypical chest pain, resolved.  2.  Hypertensive urgency, resolved.  3.  Hypertension.  4.  Obesity.   HISTORY OF PRESENT ILLNESS:  Please refer to admission history and physical.  In brief, Ms. Hernandez is a 45 year old African-American female who developed  headaches, light headedness two days prior to hospitalization.  She was  found to have elevated blood pressures 160/100.  She also had accompanying  substernal chest pressure.  She has no prior history of hypertension.  She  was admitted for further treatment and evaluation.   Of note is that Ms. Macinnes has been using Phentermine in addition to birth  control pills.   HOSPITAL COURSE:  PROBLEM #1:  CHEST PAIN:  The patient was admitted to rule  out myocardial infarction.  She indeed ruled out with three negative sets of  cardiac enzymes.  Her electrocardiogram was normal without any acute ST  changes. She had a 2-dimensional echocardiogram which showed normal systolic  function and no structural abnormalities.  A D-dimer was negative and thus  was  not pursued further to rule out pulmonary embolus.  She indeed had  stable oxygen saturation on room air.  It was felt her chest pain is  atypical and is probably secondary to the severe hypertension in this young  lady and this hypertension is probably related to the combination of  Phentermine and birth control pills.  She was strongly advised to quit using  Phentermine.  She was counseled on weight loss  modalities.  She was risk  stratified.  She does not have any family history of premature coronary  artery disease and no known family history of hypertension.  Her lipid  profile showed HDL of 47, LDL of 153.  She was encouraged to proceed with  weight loss counseling and we will follow the LDL.   PROBLEM #2:  HYPERTENSION/HYPERTENSIVE URGENCY:  Blood pressure was  aggressively treated within 24 hours and at the time of discharge the blood  pressure was 100/60.  She had a combination of Clonidine and  hydrochlorothiazide.  I do suspect that her blood pressure would  normalize  once she stops using Phentermine.  She will be discharged home only on  hydrochlorothiazide and she should check her blood pressure again in the  next five to seven days.  She was advised to quit using hydrochlorothiazide  if she feels dizzy and follow up with her primary care doctor.   DISCHARGE MEDICATIONS:  Hydrochlorothiazide 25 mg p.o. daily.   PROCEDURE:  1.  A 2-dimensional echocardiogram showed ventricular systolic function to  be normal. Ejection fraction of 55 to 65%.  No significant structural      abnormalities.  2.  Chest x-ray showed no active cardiopulmonary disease.   LABORATORY DATA:  Pertinent laboratory findings showing D-dimer 0.33.  Three  sets of cardiac enzymes were normal.  TSH was 2.663.  Total cholesterol 232.  Triglycerides 152.  HDL 47.  LDL 153.   CONDITION ON DISCHARGE:  Patient was discharged home in a stable condition.  She is to follow up with her primary care doctor in 5 to 7 days.      Mobolaji B. Corky Downs, M.D.  Electronically Signed     MBB/MEDQ  D:  06/20/2005  T:  06/20/2005  Job:  [id number]   cc:   Casey Burkitt  Fax: [phone]

## 2010-12-15 NOTE — Op Note (Signed)
Olivia [name], Olivia [name]                ACCOUNT NO.:  [id number]   MEDICAL RECORD NO.:  [id number]          PATIENT TYPE:  AMB   LOCATION:  SDS                          FACILITY:  [facility]   PHYSICIAN:  Reinaldo Meeker, M.D. DATE OF BIRTH:  [birth year]   DATE OF PROCEDURE:  06/14/2006  DATE OF DISCHARGE:                                 OPERATIVE REPORT   PREOPERATIVE DIAGNOSIS:  Herniated disk L5-S1 left.   POSTOPERATIVE DIAGNOSIS:  Herniated disk L5-S1 left.   PROCEDURE:  Left L5-S1 interlaminar laminotomy for excision of hernia disk  with operative microscope.   SECONDARY PROCEDURE:  Microsection L5-S1 disk and S1 nerve root.   SURGEON:  Reinaldo Meeker, M.D.   ASSISTANT:  Donalee Citrin, M.D.   PROCEDURE IN DETAIL:  After placed in the prone position, the patient's back  was prepped and draped in the usual sterile fashion.  Localizing x-rays  taken prior to incision to identify the appropriate level.  Midline incision  was made over the spinous processes of L5-S1.  Using Bovie cutting current,  the incision was carried down to the spinous processes.  Subperiosteal  dissection was then carried out along the left-sided spinous processes and  lamina.  Retractor was placed for exposure.  X-rays showed approach to the  appropriate level.  Using a high-speed drill the inferior 1/3 of the L5  lamina, the medial 1/2 of the facet joint and the superior 1/3 of the S1  lamina were removed.  Residual bone and ligamentum flavum were removed in  piecemeal fashion.  The microscope was draped and brought the field and used  for the remainder of the case.  Using microsection technique. the lateral  aspect of the thecal sac and S1 nerve were identified.  Further coagulation  was carried down to floor of canal to identify the L5-S1 disk which was  found to be markedly and focally herniated directly beneath the nerve root.  After coagulating the annulus, the annulus was incised with a 15 blade.  Using  pituitary rongeurs and curettes, thorough disk space clean-out was  carried out.  At the same time great care was taken to avoid injury to the  neural elements and this was successfully done.  At this point inspection  was carried out in all directions for any evidence of residual compression  and none could be identified.  Large amounts of irrigation carried out.  Any  bleeding was controlled bipolar coagulation and Gelfoam.  The wound was then  closed in multiple layers of Vicryl in the muscle, fascia, subcutaneous and  subcu tissues and staples on the skin.  A sterile dressing was then applied  and the patient was extubated, taken to recovery room in stable condition.           ______________________________  Reinaldo Meeker, M.D.     ROK/MEDQ  D:  06/14/2006  T:  06/14/2006  Job:  [id number]

## 2010-12-15 NOTE — Op Note (Signed)
NAMEANITA, Olivia [name]                ACCOUNT NO.:  [id number]   MEDICAL RECORD NO.:  [id number]          PATIENT TYPE:  AMB   LOCATION:  DSC                          FACILITY:  [facility]   PHYSICIAN:  Cherylynn Ridges, M.D.    DATE OF BIRTH:  1965-10-13   DATE OF PROCEDURE:  08/24/2005  DATE OF DISCHARGE:                                 OPERATIVE REPORT   PREOPERATIVE DIAGNOSIS:  Mammographic lesion of left breast.   POSTOPERATIVE DIAGNOSIS:  Mammographic lesion of left breast with likely  fibroadenoma.   PROCEDURE:  Wire localization, left breast biopsy.   SURGEON:  Cherylynn Ridges, M.D.   ANESTHESIA:  General with a laryngeal airway.   ESTIMATED BLOOD LOSS:  Less than 20 mL.   COMPLICATIONS:  None.   CONDITION:  Stable.   INDICATIONS FOR OPERATION:  The patient is a 45 year old female with a  family history of breast cancer, who comes in with a mammographic lesion  demonstrating likely benign disease; however, because of the family history,  requires biopsy.   FINDINGS:  Deep in the breast tissue was a rounded, soft, well-circumscribed  lesion in the inferior portion of the left breast.   OPERATION:  The patient was taken to the operating room and placed on the  table in a supine position.  After an adequate general laryngeal airway  anesthetic was administered, she was prepped and draped in the usual sterile  manner, exposing her left breast and the wire which had been placed  preoperatively in the breast center.   We made an incision in the inferolateral aspect of the subareolar region on  the left breast and dissected down deep into the soft tissue using a #15  blade.  Bleeding was controlled with electrocautery subsequently.  We  dissected down deep to the tip of the wire using a #15 blade and actually  removed it in toto along with the lesion which we partially cut through at  that time.  It was an oval lesion measuring approximately 2 x 2 cm in size,  the residual portion  of which was removed separately.   Once we had removed the specimen, we sent for a radiograph which confirmed  the presence of the oval mass.  We irrigated and cauterized for hemostasis  and subsequently closed in several layers.   A deep subcutaneous mammographic tissue layer of 3-0 Vicryl was passed and  then a more superficial subcutaneous  4-0 Vicryl layer was placed.  The skin  was closed using a running subcuticular stitch of 5-0 Vicryl.  All counts  were correct including needles, sponges and instruments.  The patient was  taken to recovery room in stable condition with sterile dressing applied.      Cherylynn Ridges, M.D.  Electronically Signed     JOW/MEDQ  D:  08/24/2005  T:  08/25/2005  Job:  [id number]

## 2010-12-15 NOTE — Op Note (Signed)
NAME:  [name], Olivia [name]                       ACCOUNT NO.:  [id number]   MEDICAL RECORD NO.:  [id number]                   PATIENT TYPE:  AMB   LOCATION:  SDC                                  FACILITY:  [facility]   PHYSICIAN:  Cynthia P. Romine, M.D.             DATE OF BIRTH:  Apr 20, 1966   DATE OF PROCEDURE:  10/18/2003  DATE OF DISCHARGE:                                 OPERATIVE REPORT   PREOPERATIVE DIAGNOSES:  1. Known 3 cm submucosal myoma.  2. Abnormal uterine bleeding.   POSTOPERATIVE DIAGNOSES:  1. Known 3 cm submucosal myoma.  2. Abnormal uterine bleeding.  3. Pathology pending.   PROCEDURE:  Hysteroscopic resection of submucous myoma.   SURGEON:  Cynthia P. Romine, M.D.   ANESTHESIA:  General by LMA.   ESTIMATED BLOOD LOSS:  25 mL.   SORBITOL DEFICIT:  405 mL.   COMPLICATIONS:  None.   PROCEDURE:  The patient was taken to the operating room and after the  induction of adequate general anesthesia by LMA was placed in the dorsal  lithotomy position and prepped and draped in the usual fashion.  The bladder  was drained with a red rubber catheter.  A posterior weighted and anterior  Sims retractors were placed.  The cervix was grasped on its anterior lip  with a single-tooth tenaculum and the uterus sounded to 7 cm.  The cervix  was dilated to a #31 Pratt.  The operative hysteroscope was introduced,  sorbitol was used as a distention medium.  The sorbitol pump was set at a  pressure of 70 and later in the case was increased to 90.  The myoma was  visualized.  It was attached posteriorly and fundally.  The hysteroscope was  withdrawn.  An attempt was made to grasp the myoma at its base and avulse it  with polyp forceps; however, this was not possible.  Therefore, the  hysteroscope was then reintroduced and a double loop was used on the  operative hysteroscope to shave the myoma.  This was done successfully and  the procedure was stopped when the myoma bed just appeared a  little shaggy  posteriorly, but generally the endometrial cavity looked clean and was  vastly improved from the start of the case.  The hysteroscope was removed,  instruments removed from the vagina, and the patient was taken to the  recovery room in satisfactory condition.  Electrolytes will be evaluated in  the recovery room.                                               Cynthia P. Romine, M.D.   CPR/MEDQ  D:  10/18/2003  T:  10/19/2003  Job:  [id number]

## 2010-12-15 NOTE — H&P (Signed)
Olivia [name], Olivia [name]                ACCOUNT NO.:  [id number]   MEDICAL RECORD NO.:  [id number]          PATIENT TYPE:  INP   LOCATION:  1825                         FACILITY:  [facility]   PHYSICIAN:  Hillery Aldo, M.D.   DATE OF BIRTH:  1966/07/27   DATE OF ADMISSION:  06/18/2005  DATE OF DISCHARGE:                                HISTORY & PHYSICAL   PRIMARY CARE PHYSICIAN:  Dr. __________.   CHIEF COMPLAINT:  Headache, intermittent dizziness, midsternal chest pain  with radiation to back, began around noon time today.   HISTORY OF PRESENT ILLNESS:  The patient is a 44 year old female with a two-  day history of worsening intermittent headache accompanied by  lightheadedness that worsened today around noon. The patient works at [employer] and had a nurse check her blood pressure. It was found to  be 160/100 at the time of these symptoms. The patient states chest pain was  substernal and had a pressure-like quality or tightness sensation and was  accompanied by dyspnea and nausea. The patient reports the pain lasted  approximately two hours time. The patient denies any past medical history of  hypertension or hyperlipidemia. She was admitted for further evaluation and  workup.   ALLERGIES:  No known drug allergies.   MEDICATIONS:  1.  Ortho-Novum 7/7/7 one tablet daily.  2.  Phentermine.   PAST MEDICAL HISTORY:  1.  Obesity.  2.  Fibroid disease status post myoma approximately one year ago.   FAMILY HISTORY:  The patient's mother is alive at age 62 and has cancer of  the breast. Her father is alive at age 69 and is healthy. She has no  siblings.   SOCIAL HISTORY:  The patient is married and lives with her husband and 79-  year-old son. She denies any past medical history of tobacco, alcohol or  drug use. She is employed as a Diplomatic Services operational officer at [employer].   REVIEW OF SYSTEMS:  No fever, chills, or weight changes. No subjective sense  of arrhythmia. Other  than today's episode, no other episodes of chest pain.  No dyspnea except for today as noted above. No cough. No changes in bowel  habits, melena or hematochezia. She has had some nausea today but otherwise  no vomiting or GI disturbance. No dysuria, hematuria or musculoskeletal  complaints. Her last menstrual period was June 13, 2005 and regular.   PHYSICAL EXAMINATION:  VITAL SIGNS: Temperature 98.3, pulse 65, respirations  18, blood pressure 172/104 (decreased to 144/90 on subsequent checks). O2  saturation 99% on room air.  GENERAL:  Obese female in no distress.  HEENT:  Normocephalic, atraumatic. PERRL. EOMI. Oropharynx clear. Palate  rises symmetrically. Tongue is midline.  NECK:  Supple. No thyromegaly, no lymphadenopathy, no jugular venous  distension.  CHEST: Lungs clear to auscultation bilaterally with good air movement.  HEART:  Regular rate, rhythm. No murmurs, rubs, or gallops.  ABDOMEN:  Soft, nontender, nondistended. Normoactive bowel sounds.  EXTREMITIES:  No clubbing, edema, cyanosis. Pulses are 2+.  SKIN:  Warm and dry. No rashes.  NEUROLOGIC:  The patient is alert and oriented x3. Cranial nerves II-XII are  grossly intact. She moves all extremities x4 with equal strength.   RADIOGRAPHIC DATA:  Chest x-ray shows no active cardiopulmonary disease. EKG  showed normal sinus rhythm with some T-wave inversion with flattening in V1-  V4.   LABORATORY DATA:  Sodium was 138, potassium 3.9, chloride 108, bicarbonate  28, BUN 9, creatinine 0.9, glucose 88. Hemoglobin was 15.3, hematocrit 45.  Rapid cardiac enzymes in the ER were negative x3. D-dimer was negative at  0.33.   ASSESSMENT/PLAN:  1.  Chest pain and dizziness with hypertensive urgency: The patient's      symptoms are consistent with hypertensive urgency. She does not have any      past medical history of hypertension. At this point, we will admit her      for 23-hour observation to a telemetry bed, rule out  acute coronary      syndrome with serial enzymes x3. We will further risk stratify her by      checking a fasting lipid panel. We will also check a thyroid stimulating      hormone level. We will place the patient on aspirin therapy and initiate      blood pressure lowering strategies with a diuretic (hydrochlorothiazide      25 milligrams daily) and clonidine 0.1 milligrams b.i.d. It is possible      that her hypertensive urgency is related to the phentermine which she      takes for weight loss. We will stop this medication. We will monitor her      blood pressure and discharge her tomorrow if stable.  2.  Prophylaxis: We will initiate gastrointestinal and deep vein thrombosis      prophylaxis.           ______________________________  Hillery Aldo, M.D.     CR/MEDQ  D:  06/19/2005  T:  06/19/2005  Job:  [id number]

## 2010-12-28 ENCOUNTER — Telehealth: Payer: Self-pay

## 2010-12-28 NOTE — Telephone Encounter (Signed)
Left message for pt DR.L is concerned about the B/P reading for her to please make an apt to come in and see him

## 2011-01-05 ENCOUNTER — Other Ambulatory Visit: Payer: Self-pay

## 2011-01-05 MED ORDER — POTASSIUM CHLORIDE CRYS ER 20 MEQ PO TBCR: 20.0000 meq | EXTENDED_RELEASE_TABLET | Freq: Two times a day (BID) | ORAL | Status: DC

## 2011-01-08 ENCOUNTER — Other Ambulatory Visit: Payer: Self-pay | Admitting: *Deleted

## 2011-01-08 DIAGNOSIS — R609 Edema, unspecified: Secondary | ICD-10-CM

## 2011-01-08 MED ORDER — POTASSIUM CHLORIDE CRYS ER 20 MEQ PO TBCR: 20.0000 meq | EXTENDED_RELEASE_TABLET | Freq: Two times a day (BID) | ORAL | Status: DC

## 2011-01-08 MED ORDER — FUROSEMIDE 40 MG PO TABS: 40.0000 mg | ORAL_TABLET | Freq: Every day | ORAL | Status: DC

## 2011-02-01 ENCOUNTER — Ambulatory Visit
Admission: RE | Admit: 2011-02-01 | Discharge: 2011-02-01 | Disposition: A | Discharge status: Home / Self Care | Payer: PRIVATE HEALTH INSURANCE | Source: Ambulatory Visit | Attending: Radiation Oncology | Admitting: Radiation Oncology

## 2011-02-12 ENCOUNTER — Encounter: Payer: Self-pay | Admitting: Medical

## 2011-02-12 ENCOUNTER — Ambulatory Visit (INDEPENDENT_AMBULATORY_CARE_PROVIDER_SITE_OTHER): Payer: PRIVATE HEALTH INSURANCE | Admitting: Medical

## 2011-02-12 VITALS — BP 128/80 | HR 100 | Temp 98.7°F | Ht 66.0 in | Wt 180.0 lb

## 2011-02-12 DIAGNOSIS — J039 Acute tonsillitis, unspecified: Secondary | ICD-10-CM

## 2011-02-12 MED ORDER — AMOXICILLIN 875 MG PO TABS: 875.0000 mg | ORAL_TABLET | Freq: Two times a day (BID) | ORAL | Status: AC

## 2011-02-12 NOTE — Progress Notes (Signed)
Subjective:     Olivia [name] is a 45 y.o. female who presents for evaluation of sore throat. Associated symptoms include chills, headache, mild sinus pressure and LGF.  Onset of symptoms was 1 week ago, and have been gradually worsening since that time. She is drinking plenty of fluids.  She does note sick contact over a week ago with sore throat and sniffles.  The following portions of the patient's history were reviewed and updated as appropriate: allergies, current medications, past family history, past medical history, past social history, past surgical history and problem list.  Past Medical History  Diagnosis Date  . Colon polyp   . Obesity   . Uterine fibroid   . Lumbar degenerative disc disease   . History of breast cancer     Review of Systems Constitutional: +chills, fatigue, LGF; denies sweats, unexpected weight change Allergy: denies recent sneezing, itching, congestion Dermatology: denies rash ENT: no runny nose, ear pain, sinus pain, teeth pain Cardiology: denies chest pain, palpitations Respiratory: denies cough, shortness of breath, wheezing,  Gastroenterology: denies abdominal pain, nausea, vomiting, diarrhea Musculoskeletal: denies arthralgias, myalgias, joint swelling, back pain, neck pain Ophthalmology: denies eye redness, itching, discharge    Objective:      Filed Vitals:   02/12/11 1441  BP: 128/80  Pulse: 100  Temp: 98.7 F (37.1 C)    General appearance: no distress, WD/WN, ill-appearing HEENT: normocephalic, conjunctiva/corneas normal, sclerae anicteric, TMs pearly, nares patent, no discharge or erythema, pharynx with erythema, and tonsils swollen 2+ with white exudate.  Oral cavity: MMM Neck: supple, +shoddy bilat lymphadenopathy, no thyromegaly Heart: RRR, normal S1, S2, no murmurs Lungs: CTA bilaterally, no wheezes, rhonchi, or rales Abdomen: +bs, soft, non tender, non distended, no masses, no hepatomegaly, no splenomegaly Musculoskeletal:  non tender   Assessment:   Encounter Diagnosis  Name Primary?  . Tonsillitis Yes     Plan:     Amoxicillin Rx given.  Discussed symptomatic treatment including salt water gargles, warm fluids, rest, hydrate well, can use over-the-counter Ibuprofen for throat pain, fever, or malaise. If worse or not improving within 2-3 days, call or return.

## 2011-03-13 ENCOUNTER — Encounter: Payer: Self-pay | Admitting: Family Medicine

## 2011-03-14 ENCOUNTER — Ambulatory Visit (INDEPENDENT_AMBULATORY_CARE_PROVIDER_SITE_OTHER): Payer: PRIVATE HEALTH INSURANCE | Admitting: Family Medicine

## 2011-03-14 ENCOUNTER — Encounter: Payer: Self-pay | Admitting: Family Medicine

## 2011-03-14 VITALS — BP 142/86 | HR 72 | Temp 97.7°F | Ht 64.25 in | Wt 184.0 lb

## 2011-03-14 DIAGNOSIS — N390 Urinary tract infection, site not specified: Secondary | ICD-10-CM

## 2011-03-14 DIAGNOSIS — R51 Headache: Secondary | ICD-10-CM

## 2011-03-14 LAB — POCT URINALYSIS DIPSTICK
Glucose, UA: NEGATIVE
Ketones, UA: NEGATIVE
Protein, UA: NEGATIVE
Spec Grav, UA: 1.02

## 2011-03-14 NOTE — Progress Notes (Signed)
Patient presents with pelvic pressure during voiding and afterwards, but not all the time.  Started drinking cranberry juice last week, which helped alleviate some of the pressure, but now the pressure is back, so she presents today for evaluation.  Denies any dysuria, odor to the urine.  + urgency and frequency. Takes Lasix every other day, but notices more frequency than usual. Having constipation due to pain medications she takes.  Previously took Miralax daily, but stopped a while ago.  Also mentioned R sided facial pain that woke her up yesterday.  Pain goes from the temple to her whole cheek.  Ibuprofen helped some.  Not currently using Mucinex D--used that for recent infection.  Denies any nasal congestion, postnasal drip, fevers, sore throat, cough  Past Medical History  Diagnosis Date  . Colon polyp   . Obesity   . Uterine fibroid     s/p embolization  . Lumbar degenerative disc disease   . History of breast cancer     invasive ductal R breast; s/p lumpectomy, radiation and chemo  . Obesity    Past Surgical History  Procedure Date  . Breast lumpectomy 2010    right  . Tubal ligation 2010  . Colonoscopy 2010  . Power port   . Lumbar laminectomy/decompression microdiscectomy 2008    Dr. Gerlene Fee  . Uterine fibroid embolization 2008 or 2009   Family History  Problem Relation Age of Onset  . Cancer Mother 53    breast  . Cancer Maternal Grandmother     renal cancer    Current Outpatient Prescriptions on File Prior to Visit  Medication Sig Dispense Refill  . cyclobenzaprine (FLEXERIL) 10 MG tablet Take 10 mg by mouth 3 (three) times daily as needed.        [name] esomeprazole (NEXIUM) 40 MG capsule Take 40 mg by mouth daily before breakfast.        . furosemide (LASIX) 40 MG tablet Take 1 tablet (40 mg total) by mouth daily.  30 tablet  1  . gabapentin (NEURONTIN) 100 MG tablet Take 100 mg by mouth daily. 1-2 AT BEDTIME       . ibuprofen (ADVIL,MOTRIN) 800 MG tablet Take 800 mg  by mouth every 8 (eight) hours as needed.        [name] oxycodone (OXYCONTIN) 30 MG TB12 Take 30 mg by mouth every 8 (eight) hours.        . pseudoephedrine-guaifenesin (MUCINEX D) 60-600 MG per tablet Take 1 tablet by mouth every 12 (twelve) hours.         Allergies  Allergen Reactions  . Morphine Hives   ROS:  See HPI  PHYSICAL EXAM: BP 142/86  Pulse 72  Temp 97.7 F (36.5 C)  Ht 5' 4.25" (1.632 m)  Wt 184 lb (83.462 kg)  BMI 31.34 kg/m2 Pleasant female, in no distress HEENT: PERRL, EOMI, conjunctiva clear.  Nose without drainage.  Sinuses nontender.  nontender at temporalis muscle and temporal arteries Neck: no lymphadenopathy or mass Abdomen: soft, no suprapubic tenderness, no organomegaly or mass, NABS Pelvic exam (bimanual)--ovaries palpable bilaterally, symmetric, no mass.  Uterus nontender, mobile, no mass.  Some hard stool felt posteriorly in vault Back: No CVA tenderness Extremities: no CCE  ASSESSMENT/PLAN: 1. Urinary tract infection, site not specified  POCT Urinalysis Dipstick, Urine Culture  2. Facial pain     1. Urine dip was normal. Urine culture was sent. Urinary frequency and pressure may be related to constipation and diuretics.  I recommended  she restart Miralax daily (and cut back in frequency if develops too loose/frequent of stools).  If having ongoing symptoms, despite resolution of constipation, then check pelvic u/s.  Ovaries were slightly full feeling (for her body habitus), but symmetric.  Given her h/o breast cancer, would recommend pelvic u/s if pressure symptoms don't completely resolve.  Patient is aware of this recommendation, and she can either return here, or discuss further with her GYN, Dr. Cherly Hensen  2. R facial pain.  Asymptomatic today.  May have tension/muscular component (for which she can use NSAID), and also appears to have sinus component.  Recommend sinus rinses and re-start Mucinex-D.  Discussed signs and symptoms of sinus infection.  Return if  symptoms persist/worsen

## 2011-03-14 NOTE — Patient Instructions (Signed)
Start Miralax daily for the constipation.  If your constipation improves, but you're still having pelvic pressure, please contact us.  I might want to schedule you for a pelvic ultrasound.  Drink plenty of fluids.  Try sinus rinses (or Neti-pot) and restart Mucinex-D for your sinus pressure

## 2011-03-17 LAB — URINE CULTURE: Colony Count: >=100000

## 2011-03-17 MED ORDER — SULFAMETHOXAZOLE-TRIMETHOPRIM 800-160 MG PO TABS: 1.0000 | ORAL_TABLET | Freq: Two times a day (BID) | ORAL | Status: AC

## 2011-03-17 NOTE — Progress Notes (Signed)
Addended by: Joselyn Arrow on: 03/17/2011 09:48 AM   Modules accepted: Orders

## 2011-04-26 LAB — COMPREHENSIVE METABOLIC PANEL
Albumin: 3.6
Alkaline Phosphatase: 50
BUN: 5 — ABNORMAL LOW
Creatinine, Ser: 0.7
Glucose, Bld: 110 — ABNORMAL HIGH
Total Bilirubin: 0.5
Total Protein: 7.9

## 2011-04-26 LAB — CBC
HCT: 42
Hemoglobin: 14.4
MCV: 91.5
Platelets: 425 — ABNORMAL HIGH
RDW: 13

## 2011-04-26 LAB — HCG, SERUM, QUALITATIVE: Preg, Serum: NEGATIVE

## 2011-04-27 LAB — DIFFERENTIAL
Eosinophils Absolute: 0.1
Lymphocytes Relative: 37
Lymphs Abs: 2.7
Monocytes Relative: 3
Neutrophils Relative %: 58

## 2011-04-27 LAB — HEPATIC FUNCTION PANEL
ALT: 13
Alkaline Phosphatase: 46
Indirect Bilirubin: 0.4
Total Bilirubin: 0.5
Total Protein: 7.6

## 2011-04-27 LAB — CBC
HCT: 37.7
MCV: 92.1
Platelets: 520 — ABNORMAL HIGH
RBC: 4.1
WBC: 7.4

## 2011-04-27 LAB — POCT CARDIAC MARKERS
CKMB, poc: <1 — ABNORMAL LOW
CKMB, poc: <1 — ABNORMAL LOW
Operator id: 285491
Troponin i, poc: <0.05

## 2011-04-27 LAB — URINALYSIS, ROUTINE W REFLEX MICROSCOPIC
Ketones, ur: NEGATIVE
Leukocytes, UA: NEGATIVE
Nitrite: NEGATIVE
Protein, ur: NEGATIVE
Urobilinogen, UA: 0.2

## 2011-04-27 LAB — D-DIMER, QUANTITATIVE: D-Dimer, Quant: 0.59 — ABNORMAL HIGH

## 2011-04-27 LAB — POCT I-STAT, CHEM 8
Calcium, Ion: 1.11 — ABNORMAL LOW
HCT: 37
Hemoglobin: 12.6
Sodium: 138
TCO2: 24

## 2011-04-27 LAB — URINE MICROSCOPIC-ADD ON

## 2011-05-08 ENCOUNTER — Encounter (INDEPENDENT_AMBULATORY_CARE_PROVIDER_SITE_OTHER): Payer: Self-pay | Admitting: Surgery

## 2011-05-10 ENCOUNTER — Other Ambulatory Visit: Payer: Self-pay | Admitting: Oncology

## 2011-05-10 ENCOUNTER — Encounter (HOSPITAL_BASED_OUTPATIENT_CLINIC_OR_DEPARTMENT_OTHER): Payer: PRIVATE HEALTH INSURANCE | Admitting: Oncology

## 2011-05-10 DIAGNOSIS — Z923 Personal history of irradiation: Secondary | ICD-10-CM

## 2011-05-10 DIAGNOSIS — D649 Anemia, unspecified: Secondary | ICD-10-CM

## 2011-05-10 DIAGNOSIS — C50419 Malignant neoplasm of upper-outer quadrant of unspecified female breast: Secondary | ICD-10-CM

## 2011-05-10 DIAGNOSIS — Z171 Estrogen receptor negative status [ER-]: Secondary | ICD-10-CM

## 2011-05-10 DIAGNOSIS — Z853 Personal history of malignant neoplasm of breast: Secondary | ICD-10-CM

## 2011-05-10 LAB — CBC WITH DIFFERENTIAL/PLATELET
Basophils Absolute: 0 10*3/uL (ref 0.0–0.1)
Eosinophils Absolute: 0.1 10*3/uL (ref 0.0–0.5)
HCT: 39.1 % (ref 34.8–46.6)
HGB: 13.7 g/dL (ref 11.6–15.9)
MCV: 87.1 fL (ref 79.5–101.0)
MONO%: 4 % (ref 0.0–14.0)
NEUT#: 3.1 10*3/uL (ref 1.5–6.5)
NEUT%: 53.4 % (ref 38.4–76.8)
RDW: 12.3 % (ref 11.2–14.5)
lymph#: 2.3 10*3/uL (ref 0.9–3.3)

## 2011-05-10 LAB — COMPREHENSIVE METABOLIC PANEL
Albumin: 4.7 g/dL (ref 3.5–5.2)
CO2: 26 mEq/L (ref 19–32)
Glucose, Bld: 112 mg/dL — ABNORMAL HIGH (ref 70–99)
Potassium: 3.5 mEq/L (ref 3.5–5.3)
Sodium: 141 mEq/L (ref 135–145)
Total Protein: 7.8 g/dL (ref 6.0–8.3)

## 2011-05-10 LAB — CANCER ANTIGEN 27.29: CA 27.29: 28 U/mL (ref 0–39)

## 2011-05-10 LAB — VITAMIN D 25 HYDROXY (VIT D DEFICIENCY, FRACTURES): Vit D, 25-Hydroxy: 53 ng/mL (ref 30–89)

## 2011-05-17 LAB — CBC
Platelets: 344
RDW: 12.4

## 2011-06-01 ENCOUNTER — Ambulatory Visit (INDEPENDENT_AMBULATORY_CARE_PROVIDER_SITE_OTHER): Payer: PRIVATE HEALTH INSURANCE | Admitting: Family Medicine

## 2011-06-01 ENCOUNTER — Encounter: Payer: Self-pay | Admitting: Family Medicine

## 2011-06-01 VITALS — BP 130/90 | HR 88 | Temp 98.3°F | Ht 64.25 in | Wt 185.0 lb

## 2011-06-01 DIAGNOSIS — J069 Acute upper respiratory infection, unspecified: Secondary | ICD-10-CM

## 2011-06-01 MED ORDER — AZITHROMYCIN 250 MG PO TABS: ORAL_TABLET | ORAL | Status: AC

## 2011-06-01 NOTE — Patient Instructions (Signed)
I recommend waiting 1-2 days before starting the antibiotics.  Continue Guafenesin (mucinex), decongestants, and consider trying sinus rinses or Neti-Pot to help flush out the sinuses.  Call 10 days after starting antibiotics if not completely better.  Call within 5-7 days after starting antibiotics if you are clearly worse, and not improving (ie now having fevers, worsening sinus headaches, mucus, etc.)

## 2011-06-01 NOTE — Progress Notes (Signed)
Started with sore throat and HA Monday night, HA better today. Severe head congestion. Nasal mucous is thick and yellow since Monday.  Has gotten a little looser since using Mucinex.  Sore throat is worse at night.  Has a dry cough.  Some subjective lowgrade fevers (didn't measure).   Denies sick contacts. Taking Mucinex-D during day and Alka Selzer Cold Plus night time, with some improvement. Feels a little better today than earlier in the week.  Past Medical History  Diagnosis Date  . Colon polyp   . Obesity   . Uterine fibroid     s/p embolization  . Lumbar degenerative disc disease   . History of breast cancer     invasive ductal R breast; s/p lumpectomy, radiation and chemo  . Obesity     Past Surgical History  Procedure Date  . Breast lumpectomy 2010    right  . Tubal ligation 2010  . Colonoscopy 2010  . Power port   . Lumbar laminectomy/decompression microdiscectomy 2008    Dr. Gerlene Fee  . Uterine fibroid embolization 2008 or 2009    History   Social History  . Marital Status: Married    Spouse Name: N/A    Number of Children: N/A  . Years of Education: N/A   Occupational History  . Not on file.   Social History Main Topics  . Smoking status: Never Smoker   . Smokeless tobacco: Never Used  . Alcohol Use: Yes     ON OCCASION  . Drug Use: No  . Sexually Active: Not on file   Other Topics Concern  . Not on file   Social History Narrative  . No narrative on file    Family History  Problem Relation Age of Onset  . Cancer Mother 27    breast  . Cancer Maternal Grandmother     renal cancer   Current Outpatient Prescriptions on File Prior to Visit  Medication Sig Dispense Refill  . Calcium Carbonate-Vitamin D (CALCIUM + D) 600-200 MG-UNIT TABS Take 2 tablets by mouth daily.        . cyclobenzaprine (FLEXERIL) 10 MG tablet Take 10 mg by mouth 3 (three) times daily as needed.        [name] esomeprazole (NEXIUM) 40 MG capsule Take 40 mg by mouth daily before  breakfast.        . furosemide (LASIX) 40 MG tablet Take 1 tablet (40 mg total) by mouth daily.  30 tablet  1  . gabapentin (NEURONTIN) 100 MG tablet Take 100 mg by mouth daily. 1-2 AT BEDTIME       . ibuprofen (ADVIL,MOTRIN) 800 MG tablet Take 800 mg by mouth every 8 (eight) hours as needed.        . Multiple Vitamins-Minerals (MULTIVITAMIN WITH MINERALS) tablet Take 1 tablet by mouth daily.        [name] oxycodone (OXYCONTIN) 30 MG TB12 Take 30 mg by mouth every 8 (eight) hours.        . potassium chloride SA (K-DUR,KLOR-CON) 20 MEQ tablet Take 20 mEq by mouth daily.        . pseudoephedrine-guaifenesin (MUCINEX D) 60-600 MG per tablet Take 1 tablet by mouth every 12 (twelve) hours.          Allergies  Allergen Reactions  . Morphine Hives   ROS: Denies nausea, vomiting, diarrhea.  Chronic pain.  No known increase in body aches.  Denies rashes, shortness of breath, or other concerns.  PHYSICAL EXAM: BP 130/90  Pulse 88  Temp(Src) 98.3 F (36.8 C) (Oral)  Ht 5' 4.25" (1.632 m)  Wt 185 lb (83.915 kg)  BMI 31.51 kg/m2 HEENT: PERRL, EOMI, conjunctiva clear. Nasal mucosa only mildly edematous, no erythema.  Sinuses nontender.  TM's and EAC's normal.  OP with cobblestoning posteriorly, otherwise normal Neck: no significant lymphadenopathy.  Slightly tender on right Heart: regular rate and rhythm without murmur Lungs: clear bilaterally Skin: no rash  ASSESSMENT/PLAN: 1. URI (upper respiratory infection)  azithromycin (ZITHROMAX Z-PAK) 250 MG tablet   Discussed continuing guaifenesin (Mucinex), decongestants, consider sinus rinses or neti-pot. Suspect viral etiology.  If not improving in the next 1-2 days, then start antibiotics. Reviewed symptoms of URI (and time course of illness) and those of sinus infection at length.  She states she will not wait to start antibiotics, starting today.  Reviewed normal time course of illness, and of effects of antibiotics

## 2011-06-07 ENCOUNTER — Telehealth: Payer: Self-pay | Admitting: *Deleted

## 2011-06-07 NOTE — Telephone Encounter (Signed)
Pt is requesting a call from [place] regarding a patch that was prescribed by her....she is having some issues with it.

## 2011-06-08 NOTE — Telephone Encounter (Signed)
Instructed OK to cut Clonidine 0.1 mg patch in half to try to minimize hot flashes without the unwanted side effect of fatigue. She will consider that and call with results.

## 2011-07-08 ENCOUNTER — Other Ambulatory Visit: Payer: Self-pay | Admitting: Family Medicine

## 2011-07-09 ENCOUNTER — Encounter: Payer: Self-pay | Admitting: Internal Medicine

## 2011-07-09 ENCOUNTER — Telehealth: Payer: Self-pay | Admitting: *Deleted

## 2011-07-09 NOTE — Telephone Encounter (Signed)
Left message for patient letting her know that Dr.Knapp ok's rx x 1 month but must schedule appt for med check as she needs labs done. No further refills until appt is scheduled.

## 2011-07-09 NOTE — Telephone Encounter (Signed)
Left message for patient to return my call to schedule med check or CPE, and I will be able to refill her Lasix until appt time.

## 2011-07-09 NOTE — Telephone Encounter (Signed)
Please have pt call to schedule OV for any further refills.

## 2011-07-26 ENCOUNTER — Other Ambulatory Visit: Payer: Self-pay | Admitting: Obstetrics and Gynecology

## 2011-07-26 DIAGNOSIS — Z9889 Other specified postprocedural states: Secondary | ICD-10-CM

## 2011-07-26 DIAGNOSIS — Z853 Personal history of malignant neoplasm of breast: Secondary | ICD-10-CM

## 2011-07-28 ENCOUNTER — Other Ambulatory Visit: Payer: Self-pay | Admitting: Family Medicine

## 2011-08-22 ENCOUNTER — Encounter: Payer: Self-pay | Admitting: Family Medicine

## 2011-08-22 ENCOUNTER — Ambulatory Visit (INDEPENDENT_AMBULATORY_CARE_PROVIDER_SITE_OTHER): Payer: PRIVATE HEALTH INSURANCE | Admitting: Family Medicine

## 2011-08-22 ENCOUNTER — Other Ambulatory Visit: Payer: Self-pay | Admitting: Family Medicine

## 2011-08-22 ENCOUNTER — Ambulatory Visit: Payer: PRIVATE HEALTH INSURANCE | Admitting: Family Medicine

## 2011-08-22 VITALS — BP 160/120 | HR 80 | Ht 66.0 in | Wt 184.0 lb

## 2011-08-22 DIAGNOSIS — R51 Headache: Secondary | ICD-10-CM | POA: Diagnosis not present

## 2011-08-22 DIAGNOSIS — R030 Elevated blood-pressure reading, without diagnosis of hypertension: Secondary | ICD-10-CM | POA: Diagnosis not present

## 2011-08-22 DIAGNOSIS — Z13220 Encounter for screening for lipoid disorders: Secondary | ICD-10-CM

## 2011-08-22 NOTE — Progress Notes (Signed)
Chief complaint:  Headache.  She was having daily HA's for the past 10 days. Also found bug behind her left ear wanted to check with you to see if she needed labs. Also having a lot of fatigue and just really feeling "bad."  HPI:  Started having headaches 10 days ago, mainly at both temples, left more than right.  Throbbing.  Found a bug behind her left ear, came off easily when she wiped perspiration from back of neck.  Wasn't embedded in skin, thick round black bug.  Was having headaches daily, partially relieved by Tylenol.  After finding bug, only had one headache 2 days ago.  Denies headache currently, headache better. At first she thought her headache could have been related to the herniated disks in her neck, but seemed to last longer. Denies any neck pain currently. No numbness or tingling in hands (has in both feet, chronic)  She has 7-8/10 pain in her low back currently at visit.  Has taken oxycontin and Nexium today.  Has f/u with Dr. Murray Hodgkins again in March. Review of chart shows BP 130/90 when here for sick visit, and prior had been normal 120's/70's. She has no h/o hypertension.  She takes Lasix for swelling ever since chemo.  Denies any swelling or significant leg cramps.  Had labs done in October through oncology, which were normal.  Has some pain and aching in her left leg, lateral calf.  She is requesting to have her hemoglobin checked.  Had anemia in past related to fibroid issues.  No longer has any vaginal bleeding since her chemo.   Past Medical History  Diagnosis Date  . Colon polyp   . Obesity   . Uterine fibroid     s/p embolization  . Lumbar degenerative disc disease   . History of breast cancer     invasive ductal R breast; s/p lumpectomy, radiation and chemo  . Obesity     Past Surgical History  Procedure Date  . Breast lumpectomy 2010    right  . Tubal ligation 2010  . Colonoscopy 2010  . Power port   . Lumbar laminectomy/decompression microdiscectomy 2008    Dr. Gerlene Fee  . Uterine fibroid embolization 2008 or 2009    History   Social History  . Marital Status: Married    Spouse Name: N/A    Number of Children: N/A  . Years of Education: N/A   Occupational History  . Not on file.   Social History Main Topics  . Smoking status: Never Smoker   . Smokeless tobacco: Never Used  . Alcohol Use: Yes     ON OCCASION  . Drug Use: No  . Sexually Active: Not on file   Other Topics Concern  . Not on file   Social History Narrative  . No narrative on file    Family History  Problem Relation Age of Onset  . Cancer Mother 15    breast  . Cancer Maternal Grandmother     renal cancer   Current Outpatient Prescriptions on File Prior to Visit  Medication Sig Dispense Refill  . Calcium Carbonate-Vitamin D (CALCIUM + D) 600-200 MG-UNIT TABS Take 2 tablets by mouth daily.        . cyclobenzaprine (FLEXERIL) 10 MG tablet Take 10 mg by mouth 3 (three) times daily as needed.        . furosemide (LASIX) 40 MG tablet TAKE 1 TABLET (40 MG TOTAL) BY MOUTH DAILY.  30 tablet  0  .  gabapentin (NEURONTIN) 100 MG tablet Take 100 mg by mouth daily. 1-2 AT BEDTIME       . Multiple Vitamins-Minerals (MULTIVITAMIN WITH MINERALS) tablet Take 1 tablet by mouth daily.        [name] NEXIUM 40 MG capsule TAKE 1 CAPSULE BY MOUTH ONCE A DAY  30 capsule  3  . oxycodone (OXYCONTIN) 30 MG TB12 Take 30 mg by mouth every 8 (eight) hours.        . potassium chloride SA (K-DUR,KLOR-CON) 20 MEQ tablet Take 20 mEq by mouth daily.        . nabumetone (RELAFEN) 500 MG tablet Take 500 mg by mouth 2 (two) times daily.          Allergies  Allergen Reactions  . Morphine Hives    ROS:  Tingling in feet (from back and chemo), pain L lateral calf.  Denies fevers, URI symptoms, chest pain, shortness of breath.  Some hot flashes, and has some irregular heartbeat noted with hot flashes.  PHYSICAL EXAM: BP 160/120  Pulse 80  Ht 5\' 6"  (1.676 m)  Wt 184 lb (83.462 kg)  BMI 29.70  kg/m2 150/104 by me Well developed, well-appearing female in no distress HEENT:  PERRL, EOMI, conjunctiva clear. Head normacephalic, atraumatic. Temporalis muscle and temporal arteries nontender. Sinuses nontender. OP clear Neck--no spinal tenderness.  Muscles nontender, no pain with strength testing.  Neuro: alert and oriented.  Cranial nerves intact. Normal strength, gait, finger to nose testing Fundi benign Extremities: no edema Skin: no rash.  Normal exam behind L ear, area where she found bug. Skin intact, no foreign body, erythema, rash or lymphadenopathy  ASSESSMENT/PLAN: 1. Headache  CBC with Differential, Basic metabolic panel  2. Elevated BP  Basic metabolic panel, Lipid panel  3. Screening for lipoid disorders  Lipid panel    Headaches--resolved Elevated BP--no h/o HTN.  Has no concomitant headache or chest pain.  May be related to back pain. Check chem panel  Low sodium diet reviewed.  Check BP's at home.  F/u if elevated BP's persist, especially despite adequate pain control.  Recommended taking muscle relaxant and relafen when she gets home and monitor BP today  Reassured that "bug" she found likely not related to her symptoms.  Doesn't sound like it was a tick

## 2011-08-22 NOTE — Patient Instructions (Addendum)
Go to ER if you develop chest pain, shortness of breath, severe headache with high BP's.   Monitor BP at home, and write down on piece of paper, along with pain level.  If consistently in severe pain, f/u with Dr. Murray Hodgkins sooner. If BP's remain >140/90 we may need to start you on BP medication, so schedule f/u appointment. Take your muscle relaxant and Relafen today, to help with your pain, and monitor your blood pressure.  Low sodium diet, and avoiding decongestants and stimulants such as ephedra and ma huang (herbal stimulants) will help keep BP low   2 Gram Low Sodium Diet A 2 gram sodium diet restricts the amount of sodium in the diet to no more than 2 g or 2000 mg daily. Limiting the amount of sodium is often used to help lower blood pressure. It is important if you have heart, liver, or kidney problems. Many foods contain sodium for flavor and sometimes as a preservative. When the amount of sodium in a diet needs to be low, it is important to know what to look for when choosing foods and drinks. The following includes some information and guidelines to help make it easier for you to adapt to a low sodium diet. QUICK TIPS  Do not add salt to food.   Avoid convenience items and fast food.   Choose unsalted snack foods.   Buy lower sodium products, often labeled as "lower sodium" or "no salt added."   Check food labels to learn how much sodium is in 1 serving.   When eating at a restaurant, ask that your food be prepared with less salt or none, if possible.  READING FOOD LABELS FOR SODIUM INFORMATION The nutrition facts label is a good place to find how much sodium is in foods. Look for products with no more than 500 to 600 mg of sodium per meal and no more than 150 mg per serving. Remember that 2 g = 2000 mg. The food label may also list foods as:  Sodium-free: Less than 5 mg in a serving.   Very low sodium: 35 mg or less in a serving.   Low-sodium: 140 mg or less in a serving.    Light in sodium: 50% less sodium in a serving. For example, if a food that usually has 300 mg of sodium is changed to become light in sodium, it will have 150 mg of sodium.   Reduced sodium: 25% less sodium in a serving. For example, if a food that usually has 400 mg of sodium is changed to reduced sodium, it will have 300 mg of sodium.  CHOOSING FOODS Grains  Avoid: Salted crackers and snack items. Some cereals, including instant hot cereals. Bread stuffing and biscuit mixes. Seasoned rice or pasta mixes.   Choose: Unsalted snack items. Low-sodium cereals, oats, puffed wheat and rice, shredded wheat. English muffins and bread. Pasta.  Meats  Avoid: Salted, canned, smoked, spiced, pickled meats, including fish and poultry. Bacon, ham, sausage, cold cuts, hot dogs, anchovies.   Choose: Low-sodium canned tuna and salmon. Fresh or frozen meat, poultry, and fish.  Dairy  Avoid: Processed cheese and spreads. Cottage cheese. Buttermilk and condensed milk. Regular cheese.   Choose: Milk. Low-sodium cottage cheese. Yogurt. Sour cream. Low-sodium cheese.  Fruits and Vegetables  Avoid: Regular canned vegetables. Regular canned tomato sauce and paste. Frozen vegetables in sauces. Olives. Rosita Fire. Relishes. Sauerkraut.   Choose: Low-sodium canned vegetables. Low-sodium tomato sauce and paste. Frozen or fresh vegetables. Fresh and  frozen fruit.  Condiments  Avoid: Canned and packaged gravies. Worcestershire sauce. Tartar sauce. Barbecue sauce. Soy sauce. Steak sauce. Ketchup. Onion, garlic, and table salt. Meat flavorings and tenderizers.   Choose: Fresh and dried herbs and spices. Low-sodium varieties of mustard and ketchup. Lemon juice. Tabasco sauce. Horseradish.  SAMPLE 2 GRAM SODIUM MEAL PLAN Breakfast / Sodium (mg)  1 cup low-fat milk / 143 mg   2 slices whole-wheat toast / 270 mg   1 tbs heart-healthy margarine / 153 mg   1 hard-boiled egg / 139 mg   1 small orange / 0 mg   Lunch / Sodium (mg)  1 cup raw carrots / 76 mg    cup hummus / 298 mg   1 cup low-fat milk / 143 mg    cup red grapes / 2 mg   1 whole-wheat pita bread / 356 mg  Dinner / Sodium (mg)  1 cup whole-wheat pasta / 2 mg   1 cup low-sodium tomato sauce / 73 mg   3 oz lean ground beef / 57 mg   1 small side salad (1 cup raw spinach leaves,  cup cucumber,  cup yellow bell pepper) with 1 tsp olive oil and 1 tsp red wine vinegar / 25 mg  Snack / Sodium (mg)  1 container low-fat vanilla yogurt / 107 mg   3 graham cracker squares / 127 mg  Nutrient Analysis  Calories: 2033   Protein: 77 g   Carbohydrate: 282 g   Fat: 72 g   Sodium: 1971 mg  Document Released: 07/16/2005 Document Revised: 03/28/2011 Document Reviewed: 10/17/2009 [facility] Patient Information 2012 [place], [place].

## 2011-08-23 ENCOUNTER — Encounter: Payer: Self-pay | Admitting: Family Medicine

## 2011-08-23 DIAGNOSIS — E785 Hyperlipidemia, unspecified: Secondary | ICD-10-CM | POA: Insufficient documentation

## 2011-08-23 LAB — LIPID PANEL
Cholesterol: 297 mg/dL — ABNORMAL HIGH (ref 0–200)
Total CHOL/HDL Ratio: 5.7 Ratio
VLDL: 34 mg/dL (ref 0–40)

## 2011-08-23 LAB — CBC WITH DIFFERENTIAL/PLATELET
Basophils Relative: 0 % (ref 0–1)
Hemoglobin: 13.1 g/dL (ref 12.0–15.0)
Lymphocytes Relative: 47 % — ABNORMAL HIGH (ref 12–46)
Lymphs Abs: 3 10*3/uL (ref 0.7–4.0)
Monocytes Relative: 6 % (ref 3–12)
Neutro Abs: 2.8 10*3/uL (ref 1.7–7.7)
Neutrophils Relative %: 45 % (ref 43–77)
RBC: 4.36 MIL/uL (ref 3.87–5.11)
WBC: 6.3 10*3/uL (ref 4.0–10.5)

## 2011-08-23 LAB — BASIC METABOLIC PANEL
Glucose, Bld: 92 mg/dL (ref 70–99)
Potassium: 4 mEq/L (ref 3.5–5.3)
Sodium: 139 mEq/L (ref 135–145)

## 2011-08-30 ENCOUNTER — Ambulatory Visit
Admission: RE | Admit: 2011-08-30 | Discharge: 2011-08-30 | Disposition: A | Discharge status: Home / Self Care | Payer: PRIVATE HEALTH INSURANCE | Source: Ambulatory Visit | Attending: Obstetrics and Gynecology | Admitting: Obstetrics and Gynecology

## 2011-08-30 DIAGNOSIS — Z853 Personal history of malignant neoplasm of breast: Secondary | ICD-10-CM | POA: Diagnosis not present

## 2011-08-30 DIAGNOSIS — Z9889 Other specified postprocedural states: Secondary | ICD-10-CM

## 2011-08-30 DIAGNOSIS — R928 Other abnormal and inconclusive findings on diagnostic imaging of breast: Secondary | ICD-10-CM | POA: Diagnosis not present

## 2011-08-31 ENCOUNTER — Ambulatory Visit (INDEPENDENT_AMBULATORY_CARE_PROVIDER_SITE_OTHER): Payer: PRIVATE HEALTH INSURANCE | Admitting: Surgery

## 2011-08-31 ENCOUNTER — Encounter (INDEPENDENT_AMBULATORY_CARE_PROVIDER_SITE_OTHER): Payer: Self-pay | Admitting: Surgery

## 2011-08-31 VITALS — BP 122/86 | HR 92 | Temp 98.6°F | Resp 12 | Ht 66.0 in | Wt 189.0 lb

## 2011-08-31 DIAGNOSIS — Z853 Personal history of malignant neoplasm of breast: Secondary | ICD-10-CM | POA: Diagnosis not present

## 2011-08-31 NOTE — Patient Instructions (Signed)
Return in 1 year ?

## 2011-08-31 NOTE — Progress Notes (Signed)
NAME: Olivia [name]       DOB: 01/24/1966           DATE: 08/31/2011       MRN: [id number]   Olivia [name] is a 46 y.o.Marland Kitchenfemale who presents for routine followup of her  stage 1 right breast cancerdiagnosed in 2012 and treated with breast conservation therapy ,  Radiation and chemotherapy. She has no problems or concerns on either side.  PFSH: She has had no significant changes since the last visit here.  ROS: There have been no significant changes since the last visit here  She does have some confusion since chemotherapy but this is improving.  EXAM: General: The patient is alert, oriented, generally healty appearing, NAD. Mood and affect are normal.  Breasts:  Scaring noted right breast at lumpectomy site right axilla normal.  Left breast normal  Lymphatics: She has no axillary or supraclavicular adenopathy on either side.  Extremities: Full ROM of the surgical side with no lymphedema noted.  Data Reviewed: Mammogram 08/30/2011  Scaring noted.  No suspicious findings.  Impression: Doing well, with no evidence of recurrent cancer or new cancer  Plan: Will continue to follow up on an annual basis here.

## 2011-09-29 ENCOUNTER — Other Ambulatory Visit: Payer: Self-pay | Admitting: Family Medicine

## 2011-10-01 NOTE — Telephone Encounter (Signed)
I think this pt is yours

## 2011-11-02 ENCOUNTER — Encounter: Payer: Self-pay | Admitting: Medical

## 2011-11-02 ENCOUNTER — Ambulatory Visit (INDEPENDENT_AMBULATORY_CARE_PROVIDER_SITE_OTHER): Payer: PRIVATE HEALTH INSURANCE | Admitting: Medical

## 2011-11-02 VITALS — BP 132/98 | HR 60 | Temp 98.4°F | Resp 14

## 2011-11-02 DIAGNOSIS — H6690 Otitis media, unspecified, unspecified ear: Secondary | ICD-10-CM

## 2011-11-02 DIAGNOSIS — R51 Headache: Secondary | ICD-10-CM

## 2011-11-02 DIAGNOSIS — J329 Chronic sinusitis, unspecified: Secondary | ICD-10-CM

## 2011-11-02 DIAGNOSIS — W19XXXA Unspecified fall, initial encounter: Secondary | ICD-10-CM

## 2011-11-02 MED ORDER — AMOXICILLIN-POT CLAVULANATE 875-125 MG PO TABS: 1.0000 | ORAL_TABLET | Freq: Two times a day (BID) | ORAL | Status: AC

## 2011-11-02 NOTE — Progress Notes (Signed)
Subjective: Here for 3 day hx/o sinus pressure, runny nose, sneezing, chills, feels terrible, fatigue, headache, ear pain.  Using Mucinex.  No sick contacts.  She also was reaching over to night stand last night, and felt out of bed hitting head on the night stand.  Denies vision changes, numbness, weakness, or slurred speech.  No other c/o.    Past Medical History  Diagnosis Date  . Colon polyp   . Obesity   . Uterine fibroid     s/p embolization  . Lumbar degenerative disc disease   . History of breast cancer     invasive ductal R breast; s/p lumpectomy, radiation and chemo  . Obesity     ROS as noted above in HPI    Objective:   Physical Exam  Filed Vitals:   11/02/11 1625  BP: 132/98  Pulse: 60  Temp: 98.4 F (36.9 C)  Resp: 14    General appearance: alert, no distress, WD/WN Skin: no erythema or ecchymosis HEENT: normocephalic, sclerae anicteric, TMs with erythema bilat, nares patent, no discharge or erythema, pharynx normal Oral cavity: MMM, no lesions Neck: supple, no lymphadenopathy, no thyromegaly, no masses Heart: RRR, normal S1, S2, no murmurs Lungs: CTA bilaterally, no wheezes, rhonchi, or rales Neuro: CN2-12 intact, nonfocal exam  Assessment and Plan :    Encounter Diagnoses  Name Primary?  . Otitis media Yes  . Sinusitis   . Headache   . Fall    Otitis and sinuitis - begin Augmentin, rest, hydrate well, call if worse or not improving.    Headache - she has pain medication already.  Advised she being antibiotic.    Fall - discussed fall avoidance  Follow-up prn.

## 2011-11-05 ENCOUNTER — Telehealth: Payer: Self-pay | Admitting: Family Medicine

## 2011-11-05 NOTE — Telephone Encounter (Signed)
Message copied by Janeice Robinson on Mon Nov 05, 2011  2:27 PM ------      Message from: Jac Canavan      Created: Sat Nov 03, 2011  9:13 AM       Call and see how she is doing

## 2011-11-05 NOTE — Telephone Encounter (Signed)
Patient states that she is feeling much better today, much better. She said today is so much better than Friday. CLS

## 2011-11-06 ENCOUNTER — Telehealth: Payer: Self-pay | Admitting: *Deleted

## 2011-11-06 NOTE — Telephone Encounter (Signed)
patient called in on 11-06-2011 needing to see dr.khan patient stated she needs to see dr.khan asap

## 2011-11-07 ENCOUNTER — Ambulatory Visit (HOSPITAL_BASED_OUTPATIENT_CLINIC_OR_DEPARTMENT_OTHER): Payer: PRIVATE HEALTH INSURANCE | Admitting: Oncology

## 2011-11-07 ENCOUNTER — Telehealth: Payer: Self-pay | Admitting: *Deleted

## 2011-11-07 ENCOUNTER — Ambulatory Visit (HOSPITAL_BASED_OUTPATIENT_CLINIC_OR_DEPARTMENT_OTHER): Payer: PRIVATE HEALTH INSURANCE | Admitting: Lab

## 2011-11-07 ENCOUNTER — Encounter: Payer: Self-pay | Admitting: Oncology

## 2011-11-07 VITALS — BP 130/93 | HR 108 | Temp 97.8°F | Ht 66.0 in | Wt 186.2 lb

## 2011-11-07 DIAGNOSIS — R079 Chest pain, unspecified: Secondary | ICD-10-CM

## 2011-11-07 DIAGNOSIS — C50919 Malignant neoplasm of unspecified site of unspecified female breast: Secondary | ICD-10-CM | POA: Diagnosis not present

## 2011-11-07 DIAGNOSIS — Z853 Personal history of malignant neoplasm of breast: Secondary | ICD-10-CM | POA: Insufficient documentation

## 2011-11-07 DIAGNOSIS — R232 Flushing: Secondary | ICD-10-CM

## 2011-11-07 LAB — CBC WITH DIFFERENTIAL/PLATELET
Basophils Absolute: 0 10*3/uL (ref 0.0–0.1)
EOS%: 1.9 % (ref 0.0–7.0)
Eosinophils Absolute: 0.1 10*3/uL (ref 0.0–0.5)
HGB: 13.3 g/dL (ref 11.6–15.9)
MCH: 31.1 pg (ref 25.1–34.0)
NEUT#: 2.9 10*3/uL (ref 1.5–6.5)
RDW: 12.1 % (ref 11.2–14.5)
lymph#: 3.7 10*3/uL — ABNORMAL HIGH (ref 0.9–3.3)
nRBC: 0 % (ref 0–0)

## 2011-11-07 MED ORDER — DULOXETINE HCL 20 MG PO CPEP: 20.0000 mg | ORAL_CAPSULE | Freq: Every day | ORAL | Status: DC

## 2011-11-07 NOTE — Progress Notes (Signed)
OFFICE PROGRESS NOTE  CC  KNAPP,EVE A, MD, MD [address] [place] [address] Dr. Harriette Bouillon Dr. 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  PRIOR THERAPY:  #1 patient is status post lumpectomy every 2004 80 ER negative PR negative HER-2/neu negative stage I invasive ductal carcinoma.  #2 patient then received adjuvant chemotherapy consisting of Adriamycin Cytoxan given dose dense x4 cycles followed by Taxotere which she completed in September 2010.  #3 patient then went on to receive adjuvant radiation therapy completing in November 2010.  CURRENT THERAPY:Observation.  INTERVAL HISTORY: Olivia [name] 46 y.o. female returns for Followup visit today. Overall she is doing well. Patient was seen by Colman Cater in the survivor clinic for numerous physical concerns including weight gain memory problems vaginal dryness and hot flashes. She also had significant mood swings. Patient was started on Vagifem as well as vitamin D. She tells me that the Vagifem really did not do much and she has discontinued it. Patient also was prescribed clonidine for the hot flashes and she states that for the first 2 weeks they helped but then thereafter she discontinued it because it made her very somnolent. She currently is only taking vitamin E. She otherwise denies any fevers chills night sweats headaches. Patient is concerned about some tenderness in the left side anterior rib region just below the inframammary line. She also has history of chronic pain and is on multiple medications for this. She has no peripheral paresthesias in her hands but she does occasionally experience these in her feet. She denies any bleeding problems she most recently did have a mammogram performed in January 2013. Remainder of the 10 point review of systems is negative.  MEDICAL HISTORY: Past Medical History    Diagnosis Date  . Colon polyp   . Obesity   . Uterine fibroid     s/p embolization  . Lumbar degenerative disc disease   . History of breast cancer     invasive ductal R breast; s/p lumpectomy, radiation and chemo  . Obesity   . Breast cancer, stage 1 11/07/2011    ALLERGIES:  is allergic to adhesive and morphine.  MEDICATIONS:  Current Outpatient Prescriptions  Medication Sig Dispense Refill  . amoxicillin-clavulanate (AUGMENTIN) 875-125 MG per tablet Take 1 tablet by mouth 2 (two) times daily.  20 tablet  0  . Calcium Carbonate-Vitamin D (CALCIUM + D) 600-200 MG-UNIT TABS Take 2 tablets by mouth daily.        . cyclobenzaprine (FLEXERIL) 10 MG tablet Take 10 mg by mouth 3 (three) times daily as needed.        . furosemide (LASIX) 40 MG tablet TAKE 1 TABLET (40 MG TOTAL) BY MOUTH DAILY.  30 tablet  2  . gabapentin (NEURONTIN) 100 MG tablet Take 100 mg by mouth daily. 1-2 AT BEDTIME       . Multiple Vitamins-Minerals (MULTIVITAMIN WITH MINERALS) tablet Take 1 tablet by mouth daily.        . nabumetone (RELAFEN) 500 MG tablet Take 500 mg by mouth 2 (two) times daily.        [name] NEXIUM 40 MG capsule TAKE 1 CAPSULE BY MOUTH ONCE A DAY  30 capsule  3  . oxycodone (OXYCONTIN) 30 MG TB12 Take 30 mg by mouth every 8 (eight) hours.        . potassium chloride SA (K-DUR,KLOR-CON) 20 MEQ tablet Take 20  mEq by mouth daily.        . DULoxetine (CYMBALTA) 20 MG capsule Take 1 capsule (20 mg total) by mouth daily.  30 capsule  11  . DISCONTD: potassium chloride SA (K-DUR,KLOR-CON) 20 MEQ tablet Take 1 tablet (20 mEq total) by mouth 2 (two) times daily.  30 tablet  1    SURGICAL HISTORY:  Past Surgical History  Procedure Date  . Breast lumpectomy 2010    right  . Tubal ligation 2010  . Colonoscopy 2010  . Power port   . Lumbar laminectomy/decompression microdiscectomy 2008    Dr. Gerlene Fee  . Uterine fibroid embolization 2008 or 2009    REVIEW OF SYSTEMS:  Pertinent items are noted in HPI.    PHYSICAL EXAMINATION: General appearance: alert, cooperative and appears stated age Neck: no adenopathy, no carotid bruit, no JVD, supple, symmetrical, trachea midline and thyroid not enlarged, symmetric, no tenderness/mass/nodules Lymph nodes: Cervical, supraclavicular, and axillary nodes normal. Resp: clear to auscultation bilaterally and normal percussion bilaterally Back: symmetric, no curvature. ROM normal. No CVA tenderness. Cardio: regular rate and rhythm, S1, S2 normal, no murmur, click, rub or gallop GI: soft, non-tender; bowel sounds normal; no masses,  no organomegaly Extremities: extremities normal, atraumatic, no cyanosis or edema Neurologic: Grossly normal Breast examination left breast does reveal a well-healed surgical scar there is tenderness in the rib cage anteriorly. Right breast reveals well-healed surgical scar no masses are palpated no nipple discharge.  ECOG PERFORMANCE STATUS: 1 - Symptomatic but completely ambulatory  Blood pressure 130/93, pulse 108, temperature 97.8 F (36.6 C), temperature source Oral, height 5\' 6"  (1.676 m), weight 186 lb 3.2 oz (84.46 kg).  LABORATORY DATA: Lab Results  Component Value Date   WBC 7.3 11/07/2011   HGB 13.3 11/07/2011   HCT 38.6 11/07/2011   MCV 89.8 11/07/2011   PLT 287 11/07/2011      RADIOGRAPHIC STUDIES:  No results found.  ASSESSMENT: 46 year old female with  #1 stage I ER/PR negative HER-2/neu negative node negative invasive ductal carcinoma of the right breast status post lumpectomy and every 2010 followed by adjuvant chemotherapy which she completed in September 2010 and followed by radiation completing in November 2010. She is without any clinical evidence of recurrent disease.  #2 patient's mammograms are up-to-date.  #3 patient is concerned about the pain that she is experiencing and thus I have opted to get a bone scan performed to rule out any radiologic evidence of metastatic disease.  #4 patient  continues to suffer from hot flashes and is in spite of clonidine and other medications prescribed to her she is unable to tolerate these. I have therefore recommended that we may try Cymbalta to see if that helps her.   PLAN:   #1 patient will get a bone scan to make sure that she doesn't have any metastatic disease. Since she is having some tenderness in the left rib cage which is new for her.  #2 patient will be prescribed Cymbalta 20 mg on a daily basis risks and benefits of this were discussed with her completely. She will call me with any problems.  #3 patient will be seen back in 6 months time in followup , of course I can see her sooner if need arises    All questions were answered. The patient knows to call the clinic with any problems, questions or concerns. We can certainly see the patient much sooner if necessary.  I spent 30 minutes counseling the patient face to face.  The total time spent in the appointment was 30 minutes.    Drue Second, MD Medical/Oncology [facility] [phone] (beeper) [phone] (Office)  11/07/2011, 5:56 PM

## 2011-11-07 NOTE — Telephone Encounter (Signed)
gave patient appointment for 11-07-2011 sent patient back to the lab gave patient appointment for 04-2012 printed out calendar and gave to the patient 

## 2011-11-07 NOTE — Patient Instructions (Signed)
1. You are doing well. We will get a bone scan to evaluate the rib pain.  2. Cymbalta 20 mg daily call with any problems with this medicine  3. I will see you back in 6 months time or sooner if need arises

## 2011-11-07 NOTE — Telephone Encounter (Signed)
gave patient appointment for 11-07-2011 sent patient back to the lab gave patient appointment for 04-2012 printed out calendar and gave to the patient

## 2011-11-08 LAB — COMPREHENSIVE METABOLIC PANEL
ALT: 23 U/L (ref 0–35)
Albumin: 4.2 g/dL (ref 3.5–5.2)
Alkaline Phosphatase: 95 U/L (ref 39–117)
Glucose, Bld: 100 mg/dL — ABNORMAL HIGH (ref 70–99)
Potassium: 3.3 mEq/L — ABNORMAL LOW (ref 3.5–5.3)
Sodium: 138 mEq/L (ref 135–145)
Total Protein: 8.5 g/dL — ABNORMAL HIGH (ref 6.0–8.3)

## 2011-11-13 ENCOUNTER — Encounter (HOSPITAL_COMMUNITY)
Admission: RE | Admit: 2011-11-13 | Discharge: 2011-11-13 | Disposition: A | Discharge status: Home / Self Care | Payer: PRIVATE HEALTH INSURANCE | Source: Ambulatory Visit | Attending: Oncology | Admitting: Oncology

## 2011-11-13 ENCOUNTER — Ambulatory Visit (HOSPITAL_COMMUNITY)
Admission: RE | Admit: 2011-11-13 | Discharge: 2011-11-13 | Disposition: A | Discharge status: Home / Self Care | Payer: PRIVATE HEALTH INSURANCE | Source: Ambulatory Visit | Attending: Oncology | Admitting: Oncology

## 2011-11-13 DIAGNOSIS — C50919 Malignant neoplasm of unspecified site of unspecified female breast: Secondary | ICD-10-CM | POA: Diagnosis not present

## 2011-11-13 DIAGNOSIS — R079 Chest pain, unspecified: Secondary | ICD-10-CM | POA: Insufficient documentation

## 2011-11-13 DIAGNOSIS — Z0389 Encounter for observation for other suspected diseases and conditions ruled out: Secondary | ICD-10-CM | POA: Diagnosis not present

## 2011-11-13 MED ORDER — TECHNETIUM TC 99M MEDRONATE IV KIT
25.0000 | PACK | Freq: Once | INTRAVENOUS | Status: AC | PRN
  Administered 2011-11-13: 25 via INTRAVENOUS

## 2011-11-15 ENCOUNTER — Telehealth: Payer: Self-pay | Admitting: *Deleted

## 2011-11-15 NOTE — Telephone Encounter (Signed)
Message copied by Alanni Vader, Gerald Leitz on Thu Nov 15, 2011 12:38 PM ------      Message from: Victorino December      Created: Thu Nov 15, 2011 12:08 PM       Call patient: let patient know the bone scan does not show cancer

## 2011-11-15 NOTE — Telephone Encounter (Signed)
Call to pt, per MD- Pt Bone scan does not show cancer. Pt verbalized understanding.

## 2011-11-15 NOTE — Telephone Encounter (Signed)
Per md, attempt made to notify pt  Bone scan does not show cancer. Unable to reach pt- LMOVM to please call back [phone]

## 2011-11-29 DIAGNOSIS — M5412 Radiculopathy, cervical region: Secondary | ICD-10-CM | POA: Diagnosis not present

## 2011-12-06 ENCOUNTER — Other Ambulatory Visit: Payer: Self-pay | Admitting: Family Medicine

## 2011-12-23 ENCOUNTER — Encounter (HOSPITAL_COMMUNITY): Payer: Self-pay | Admitting: *Deleted

## 2011-12-23 ENCOUNTER — Emergency Department (HOSPITAL_COMMUNITY)
Admission: EM | Admit: 2011-12-23 | Discharge: 2011-12-23 | Disposition: A | Discharge status: Home / Self Care | Payer: PRIVATE HEALTH INSURANCE | Attending: Emergency Medicine | Admitting: Emergency Medicine

## 2011-12-23 DIAGNOSIS — S300XXA Contusion of lower back and pelvis, initial encounter: Secondary | ICD-10-CM | POA: Diagnosis not present

## 2011-12-23 DIAGNOSIS — W540XXA Bitten by dog, initial encounter: Secondary | ICD-10-CM | POA: Insufficient documentation

## 2011-12-23 DIAGNOSIS — S41109A Unspecified open wound of unspecified upper arm, initial encounter: Secondary | ICD-10-CM | POA: Insufficient documentation

## 2011-12-23 MED ORDER — AMOXICILLIN-POT CLAVULANATE 875-125 MG PO TABS: 1.0000 | ORAL_TABLET | Freq: Two times a day (BID) | ORAL | Status: DC

## 2011-12-23 MED ORDER — OXYCODONE-ACETAMINOPHEN 5-325 MG PO TABS
2.0000 | ORAL_TABLET | Freq: Once | ORAL | Status: AC
  Administered 2011-12-23: 2 via ORAL
  Filled 2011-12-23: qty 2

## 2011-12-23 MED ORDER — AMOXICILLIN-POT CLAVULANATE 875-125 MG PO TABS
1.0000 | ORAL_TABLET | ORAL | Status: AC
  Administered 2011-12-23: 1 via ORAL
  Filled 2011-12-23 (×2): qty 1

## 2011-12-23 MED ORDER — TETANUS-DIPHTH-ACELL PERTUSSIS 5-2.5-18.5 LF-MCG/0.5 IM SUSP
INTRAMUSCULAR | Status: AC
  Filled 2011-12-23: qty 0.5

## 2011-12-23 MED ORDER — TETANUS-DIPHTH-ACELL PERTUSSIS 5-2.5-18.5 LF-MCG/0.5 IM SUSP
0.5000 mL | INTRAMUSCULAR | Status: AC
  Administered 2011-12-23: 0.5 mL via INTRAMUSCULAR

## 2011-12-23 MED ORDER — AMOXICILLIN-POT CLAVULANATE 875-125 MG PO TABS: 1.0000 | ORAL_TABLET | Freq: Two times a day (BID) | ORAL | Status: AC

## 2011-12-23 MED ORDER — TETANUS-DIPHTH-ACELL PERTUSSIS 5-2.5-18.5 LF-MCG/0.5 IM SUSP: 0.5000 mL | Freq: Once | INTRAMUSCULAR | Status: DC

## 2011-12-23 MED ORDER — TETANUS-DIPHTH-ACELL PERTUSSIS 5-2-15.5 LF-MCG/0.5 IM SUSP: 0.5000 mL | Freq: Once | INTRAMUSCULAR | Status: DC

## 2011-12-23 NOTE — ED Provider Notes (Signed)
History   This chart was scribed for Olivia Co, MD by Olivia [name]. The patient was seen in room STRE1/STRE1. Patient's care was started at 1711.   CSN: [id number]  Arrival date & time 12/23/11  1711   First MD Initiated Contact with Patient 12/23/11 1736      Chief Complaint  Patient presents with  . Animal Bite  . Back Pain    Patient is a 46 y.o. female presenting with animal bite and back pain.  Animal Bite  Pertinent negatives include no nausea, no vomiting and no weakness.  Back Pain  Pertinent negatives include no fever and no weakness.    Olivia [name] is a 45 y.o. female who presents to the Emergency Department complaining of sudden moderate severe animal bite localized on arm onset 4 hrs ago with associated bleeding and no apparent discoloration. Pt also reports slight back pain as the result of fall, as well as small abrassion to the inside of the right thigh. Pt states that she was attacked by a pitt bull that bit into her left arm causing her to fall to the ground and incidentally sustain abrasions to the legs. Pt has received no treatment prior to arrival in the Emergency Department. Pt believes that she is up to date on Tetanus shots, and lists allergies to Adhesive and Morphine.   Past Medical History  Diagnosis Date  . Colon polyp   . Obesity   . Uterine fibroid     s/p embolization  . Lumbar degenerative disc disease   . History of breast cancer     invasive ductal R breast; s/p lumpectomy, radiation and chemo  . Obesity   . Breast cancer, stage 1 11/07/2011    Past Surgical History  Procedure Date  . Breast lumpectomy 2010    right  . Tubal ligation 2010  . Colonoscopy 2010  . Power port   . Lumbar laminectomy/decompression microdiscectomy 2008    Dr. Gerlene Fee  . Uterine fibroid embolization 2008 or 2009    Family History  Problem Relation Age of Onset  . Cancer Mother 46    breast  . Cancer Maternal Grandmother     renal cancer     History  Substance Use Topics  . Smoking status: Never Smoker   . Smokeless tobacco: Never Used  . Alcohol Use: Yes     ON OCCASION   Review of Systems  Constitutional: Negative for fever and chills.  Respiratory: Negative for shortness of breath.   Gastrointestinal: Negative for nausea and vomiting.  Musculoskeletal: Positive for back pain.  Skin: Positive for wound. Negative for color change.  Neurological: Negative for weakness.   A complete 10 system review of systems was obtained and all systems are negative except as noted in the HPI and PMH.  Allergies  Adhesive and Morphine  Home Medications   Current Outpatient Rx  Name Route Sig Dispense Refill  . CALCIUM CARBONATE-VITAMIN D 600-200 MG-UNIT PO TABS Oral Take 2 tablets by mouth daily.      . CYCLOBENZAPRINE HCL 10 MG PO TABS Oral Take 10 mg by mouth 3 (three) times daily as needed.      [name] ESOMEPRAZOLE MAGNESIUM 40 MG PO CPDR Oral Take 40 mg by mouth daily before breakfast.    . FUROSEMIDE 40 MG PO TABS Oral Take 40 mg by mouth daily.    [name] GABAPENTIN 100 MG PO TABS Oral Take 100 mg by mouth daily. 1-2 AT BEDTIME     .  MULTI-VITAMIN/MINERALS PO TABS Oral Take 1 tablet by mouth daily.      [name] NABUMETONE 500 MG PO TABS Oral Take 500 mg by mouth 2 (two) times daily.      . OXYCODONE HCL ER 20 MG PO TB12 Oral Take 20 mg by mouth every 8 (eight) hours.    [name] PERCOCET PO Oral Take 1 tablet by mouth daily.    [name] POTASSIUM CHLORIDE CRYS ER 20 MEQ PO TBCR Oral Take 20 mEq by mouth daily.        BP 166/109  Pulse 113  Temp(Src) 98.4 F (36.9 C) (Oral)  Resp 21  SpO2 100%  Physical Exam  Nursing note and vitals reviewed. Constitutional: She is oriented to person, place, and time. She appears well-developed and well-nourished. No distress.  HENT:  Head: Normocephalic and atraumatic.  Eyes: EOM are normal. Pupils are equal, round, and reactive to light.  Neck: Neck supple. No tracheal deviation present.  Cardiovascular:  Normal rate.   Pulmonary/Chest: Effort normal. No respiratory distress.  Abdominal: Soft. She exhibits no distension.  Musculoskeletal: Normal range of motion. She exhibits no edema.       2 puncture wounds on distal and proximal humorous itself. Right inferior prospect of buttox 2 abrasion wounds  Neurological: She is alert and oriented to person, place, and time. No sensory deficit.  Skin: Skin is warm and dry.  Psychiatric: She has a normal mood and affect. Her behavior is normal.    ED Course  Procedures (including critical care time)  Labs Reviewed - No data to display No results found.   1. Dog bite       MDM  The patient has two puncture wounds to her left distal humerus.  These don't appear to involve the joint space.  These were irrigated extensively at the bedside after lidocaine was administered.  Augmentin in the emergency department and at home with a seven-day course of the same.  Tetanus updated in the emergency department.  The patient was given infection warnings.  The other areas appear to be deep scratches.  There are no more puncture wounds.  She will to standard wound care at home with topical antibacterial ointment.  She understands that despite antibiotics this to become worse and she will need to return emergently to the emergency department   I personally performed the services described in this documentation, which was scribed in my presence. The recorded information has been reviewed and considered.      Olivia Co, MD 12/23/11 1900

## 2011-12-23 NOTE — Discharge Instructions (Signed)
Animal Bite  An animal bite can result in a scratch on the skin, deep open cut, puncture of the skin, crush injury, or tearing away of the skin or a body part. Dogs are responsible for most animal bites. Children are bitten more often than adults. An animal bite can range from very mild to more serious. A small bite from your house pet is no cause for alarm. However, some animal bites can become infected or injure a bone or other tissue. You must seek medical care if:  · The skin is broken and bleeding does not slow down or stop after 15 minutes.  · The puncture is deep and difficult to clean (such as a cat bite).  · Pain, warmth, redness, or pus develops around the wound.  · The bite is from a stray animal or rodent. There may be a risk of rabies infection.  · The bite is from a snake, raccoon, skunk, fox, coyote, or bat. There may be a risk of rabies infection.  · The person bitten has a chronic illness such as diabetes, liver disease, or cancer, or the person takes medicine that lowers the immune system.  · There is concern about the location and severity of the bite.  It is important to clean and protect an animal bite wound right away to prevent infection. Follow these steps:  · Clean the wound with plenty of water and soap.  · Apply an antibiotic cream.  · Apply gentle pressure over the wound with a clean towel or gauze to slow or stop bleeding.  · Elevate the affected area above the heart to help stop any bleeding.  · Seek medical care. Getting medical care within 8 hours of the animal bite leads to the best possible outcome.  DIAGNOSIS   Your caregiver will most likely:  · Take a detailed history of the animal and the bite injury.  · Perform a wound exam.  · Take your medical history.  Blood tests or X-rays may be performed. Sometimes, infected bite wounds are cultured and sent to a lab to identify the infectious bacteria.   TREATMENT   Medical treatment will depend on the location and type of animal bite as  well as the patient's medical history. Treatment may include:  · Wound care, such as cleaning and flushing the wound with saline solution, bandaging, and elevating the affected area.  · Antibiotics.  · Tetanus immunization.  · Rabies immunization.  · Leaving the wound open to heal. This is often done with animal bites, due to the high risk of infection. However, in certain cases, wound closure with stitches, wound adhesive, skin adhesive strips, or staples may be used.   Infected bites that are left untreated may require intravenous (IV) antibiotics and surgical treatment in the hospital.  HOME CARE INSTRUCTIONS  · Follow your caregiver's instructions for wound care.  · Take all medicines as directed.  · If your caregiver prescribes antibiotics, take them as directed. Finish them even if you start to feel better.  · Follow up with your caregiver for further exams or immunizations as directed.  You may need a tetanus shot if:  · You cannot remember when you had your last tetanus shot.  · You have never had a tetanus shot.  · The injury broke your skin.  If you get a tetanus shot, your arm may swell, get red, and feel warm to the touch. This is common and not a problem. If you need a tetanus   shot and you choose not to have one, there is a rare chance of getting tetanus. Sickness from tetanus can be serious.  SEEK MEDICAL CARE IF:  · You notice warmth, redness, soreness, swelling, pus discharge, or a bad smell coming from the wound.  · You have a red line on the skin coming from the wound.  · You have a fever, chills, or a general ill feeling.  · You have nausea or vomiting.  · You have continued or worsening pain.  · You have trouble moving the injured part.  · You have other questions or concerns.  MAKE SURE YOU:  · Understand these instructions.  · Will watch your condition.  · Will get help right away if you are not doing well or get worse.  Document Released: 04/03/2011 Document Revised: 07/05/2011 Document  Reviewed: 04/03/2011  [employer]® Patient Information ©2012 [employer].

## 2011-12-23 NOTE — ED Notes (Signed)
Pt. Was bitten by her Aunts pit bull.  Pt. Has bite marks to the left elbow, left and right leg, and right buttocks.  Pt. reports that the attack was unprovoked and that she was just walking up tot he yard.

## 2011-12-25 ENCOUNTER — Other Ambulatory Visit: Payer: Self-pay | Admitting: Family Medicine

## 2011-12-25 NOTE — Telephone Encounter (Signed)
IS THIS OK 

## 2011-12-25 NOTE — Telephone Encounter (Signed)
Have her schedule an appointment to see Dr. Lynelle Doctor for followup on her blood pressure

## 2011-12-31 DIAGNOSIS — G4733 Obstructive sleep apnea (adult) (pediatric): Secondary | ICD-10-CM | POA: Diagnosis not present

## 2012-01-05 ENCOUNTER — Other Ambulatory Visit: Payer: Self-pay | Admitting: Family Medicine

## 2012-01-24 DIAGNOSIS — M5412 Radiculopathy, cervical region: Secondary | ICD-10-CM | POA: Diagnosis not present

## 2012-02-09 ENCOUNTER — Other Ambulatory Visit: Payer: Self-pay | Admitting: Medical

## 2012-02-11 NOTE — Telephone Encounter (Signed)
Patient needs to come in for office visit before the medication runs out in order to receive any more refills.

## 2012-03-04 ENCOUNTER — Other Ambulatory Visit: Payer: Self-pay | Admitting: Medical

## 2012-03-05 DIAGNOSIS — L658 Other specified nonscarring hair loss: Secondary | ICD-10-CM | POA: Diagnosis not present

## 2012-03-05 DIAGNOSIS — L819 Disorder of pigmentation, unspecified: Secondary | ICD-10-CM | POA: Diagnosis not present

## 2012-03-05 NOTE — Telephone Encounter (Signed)
Patient needs a office visit. 

## 2012-03-26 ENCOUNTER — Encounter: Payer: Self-pay | Admitting: Family Medicine

## 2012-03-26 ENCOUNTER — Ambulatory Visit (INDEPENDENT_AMBULATORY_CARE_PROVIDER_SITE_OTHER): Payer: PRIVATE HEALTH INSURANCE | Admitting: Family Medicine

## 2012-03-26 VITALS — BP 138/100 | HR 72 | Ht 66.0 in | Wt 181.0 lb

## 2012-03-26 DIAGNOSIS — M25551 Pain in right hip: Secondary | ICD-10-CM

## 2012-03-26 DIAGNOSIS — M79609 Pain in unspecified limb: Secondary | ICD-10-CM

## 2012-03-26 DIAGNOSIS — R1031 Right lower quadrant pain: Secondary | ICD-10-CM

## 2012-03-26 DIAGNOSIS — M25559 Pain in unspecified hip: Secondary | ICD-10-CM | POA: Diagnosis not present

## 2012-03-26 DIAGNOSIS — M79606 Pain in leg, unspecified: Secondary | ICD-10-CM

## 2012-03-26 LAB — CBC WITH DIFFERENTIAL/PLATELET
Basophils Absolute: 0 10*3/uL (ref 0.0–0.1)
Basophils Relative: 0 % (ref 0–1)
Eosinophils Absolute: 0.1 10*3/uL (ref 0.0–0.7)
Eosinophils Relative: 2 % (ref 0–5)
Lymphs Abs: 3.2 10*3/uL (ref 0.7–4.0)
MCH: 30.7 pg (ref 26.0–34.0)
MCHC: 34.8 g/dL (ref 30.0–36.0)
Neutrophils Relative %: 43 % (ref 43–77)
Platelets: 295 10*3/uL (ref 150–400)
RBC: 4.59 MIL/uL (ref 3.87–5.11)
RDW: 13.4 % (ref 11.5–15.5)

## 2012-03-26 LAB — BASIC METABOLIC PANEL
Calcium: 9.7 mg/dL (ref 8.4–10.5)
Creat: 0.78 mg/dL (ref 0.50–1.10)
Sodium: 141 mEq/L (ref 135–145)

## 2012-03-26 NOTE — Progress Notes (Signed)
Chief Complaint  Patient presents with  . Thigh pain    right thigh pain since Sunday am. Pain radiates down her thigh. Pain is also in her side.   HPI:  Pain at R groin and lower quadrant x 4 days, with an ache radiating into thigh.  Started out as knee ache, then acutely started having pain in thigh, abdomen and groin. Pain was constant, severe, "12/10".  Got some relief from her regular pain medications she takes for her back.  Pain continued 8-9/10 for 2 days, yesterday 7/10.  This morning is only 3-4/10 today.  She takes Relafen regularly (chronically).  She had also used heating pad with some improvement.  Denies any change in activity or known injury.  Had hurt with standing, walking, any movement.  Now is improved. Currently denies any numbness, tingling, weakness. 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 She denies nausea, vomiting.  She describes the pain as being in her RLQ of abdomen, and into groin also.  Denies worsening constipation, change in bowel habits, blood in stool, blood in urine. Has mild chronic constipation due to narcotics.   She has known h/o back problems, s/p surgery in past, but ongoing disk issues.  Pain usually is on the right side, but feels different than this pain.  She no longer has pain in her left leg.    She takes Lasix for swelling, since chemo.  Has recurrent edema if she doesn't take the medication. Compliant with taking potassium.  Last MRI 03/2009: IMPRESSION:  1. Uncomplicated postoperative changes at L5-S1 appear little  changed from the prior exam of 09/02/2007. Enhancing epidural scar  remains present around the left S1 nerve root.  2. New left L3-L4 foraminal and extraforaminal protrusion with  mild left L3-L4 stenosis, could potentially produce left L3  radiculopathy.  3. Unchanged L4-L5 central disc protrusion and concentric annular  tearing.  Chart reviewed--sees Dr. Welton Flakes.  She was rx'd Cymbalta to help with hot  flashes (having failed clonidine and other meds).  She admits to never starting this because she didn't want to be "in zombie mode" or be on any anti-depressant. Has been taking "i-cool" x 3-4 months with improvement of hot flashes.  Bone scan done for rib pain--normal.  Past Medical History  Diagnosis Date  . Colon polyp   . Obesity   . Uterine fibroid     s/p embolization  . Lumbar degenerative disc disease   . History of breast cancer     invasive ductal R breast; s/p lumpectomy, radiation and chemo  . Obesity   . Breast cancer, stage 1 11/07/2011   Past Surgical History  Procedure Date  . Breast lumpectomy 2010    right  . Tubal ligation 2010  . Colonoscopy 2010  . Power port   . Lumbar laminectomy/decompression microdiscectomy 2007, 2008    Dr. Gerlene Fee x 2  . Uterine fibroid embolization 2008 or 2009   History   Social History  . Marital Status: Married    Spouse Name: N/A    Number of Children: N/A  . Years of Education: N/A   Occupational History  . Not on file.   Social History Main Topics  . Smoking status: Never Smoker   . Smokeless tobacco: Never Used  . Alcohol Use: Yes     ON OCCASION  . Drug Use: No  . Sexually Active: Yes   Other Topics Concern  . Not on file   Social History Narrative  .  No narrative on file   Current Outpatient Prescriptions on File Prior to Visit  Medication Sig Dispense Refill  . Calcium Carbonate-Vitamin D (CALCIUM + D) 600-200 MG-UNIT TABS Take 2 tablets by mouth daily.        . cyclobenzaprine (FLEXERIL) 10 MG tablet Take 10 mg by mouth 3 (three) times daily as needed.        [name] esomeprazole (NEXIUM) 40 MG capsule Take 40 mg by mouth daily before breakfast.      . furosemide (LASIX) 40 MG tablet Take 40 mg by mouth daily.      . Multiple Vitamins-Minerals (MULTIVITAMIN WITH MINERALS) tablet Take 1 tablet by mouth daily.        . nabumetone (RELAFEN) 500 MG tablet Take 500 mg by mouth 2 (two) times daily.        [name] oxyCODONE  (OXYCONTIN) 20 MG 12 hr tablet Take 20 mg by mouth every 8 (eight) hours.      . potassium chloride SA (K-DUR,KLOR-CON) 20 MEQ tablet Take 20 mEq by mouth daily.        [name] DISCONTD: furosemide (LASIX) 40 MG tablet TAKE 1 TABLET (40 MG TOTAL) BY MOUTH DAILY.  30 tablet  0   Allergies  Allergen Reactions  . Adhesive (Tape) Hives  . Morphine Hives   ROS: Denies fevers, dizziness, chest pain, shortness of breath, nausea, vomiting, bowel changes (see HPI).  Denies skin lesions/rashes.  See HPI.  No urinary complaints, URI symptoms or other concerns except as per HPI  PHYSICAL EXAM: BP 138/100  Pulse 72  Ht 5\' 6"  (1.676 m)  Wt 181 lb (82.101 kg)  BMI 29.21 kg/m2 Pleasant female, in no distress Heart: regular rate and rhythm without murmur Lungs: clear bilaterally Back: no spine or CVA tenderness, no SI joint tenderness or muscle spasm Abdomen: soft, nontender, no organomegaly or mass.  Area of severe pain had been RLQ, somewhat laterally.  Completely nontender on exam, no mass Extremities: no edema.  FROM of hip, some "pulling" sensation with ROM.  Pain posterior thigh and slight in anterior thigh with hip flexion against resistance. Neuro: alert and oriented.  DTR's diminished bilaterally, symmetric.  Normal strength, sensation.  Some pain in R thigh with SLR.  Pain with hip flexor stretching (hip extension), and with side bending (stretching of abs)  ASSESSMENT/PLAN: 1. Hip pain, right  DG Hip Complete Right  2. RLQ abdominal pain  CBC with Differential, Basic metabolic panel  3. Leg pain  Basic metabolic panel    R abdominal pain--normal exam, nontender.  Only had discomfort with movements, not on examination.  Suspect muscular. Differential diagnosis includes constipation/GI etiology, ovarian cyst or GYN etiology.  Given pain into thigh and with movements, I suspect etiology is muscular  Check CBC do ensure not missing another etiology/infectious that would warrant further evaluation  of abdominal pain (ie CT scan).  Also check electrolytes given she takes diuretics--could severe pain have been related to muscle spasm?  Edema is well treated/stable.  If R groin pain recurs/persists, then recommend X-ray of R hip (order entered--she can go if getting worse).  Otherwise, recommend continued NSAIDs, ongoing use of heat and stretches.  Postmenopausal symptoms--improved on OTC i-cool. Chronic pain.  Stable  F/u if symptoms persist or worsen

## 2012-03-26 NOTE — Patient Instructions (Signed)
Do stretches as shown--best done after applying heat.  Continue with heat 3x/day. Go to [facility] if having ongoing pain at right groin area (order for x-ray is in computer).  We will contact you with your lab results.  If you WBC is elevated, we may need to investigate further.  If labs okay, treat as muscle strain with heat, stretches and relafen.

## 2012-03-27 ENCOUNTER — Encounter: Payer: Self-pay | Admitting: Family Medicine

## 2012-04-01 ENCOUNTER — Other Ambulatory Visit: Payer: Self-pay | Admitting: Family Medicine

## 2012-04-02 NOTE — Telephone Encounter (Signed)
I think this is your pt

## 2012-04-05 ENCOUNTER — Other Ambulatory Visit: Payer: Self-pay | Admitting: Medical

## 2012-04-17 DIAGNOSIS — M5412 Radiculopathy, cervical region: Secondary | ICD-10-CM | POA: Diagnosis not present

## 2012-04-22 ENCOUNTER — Telehealth: Payer: Self-pay | Admitting: Oncology

## 2012-04-22 NOTE — Telephone Encounter (Signed)
lmonvm adviisng the pt of her r/s oct 9th appt to 05/29/2012 due to the md's schedule

## 2012-05-07 ENCOUNTER — Ambulatory Visit: Payer: PRIVATE HEALTH INSURANCE | Admitting: Oncology

## 2012-05-07 ENCOUNTER — Other Ambulatory Visit: Payer: PRIVATE HEALTH INSURANCE | Admitting: Lab

## 2012-05-10 ENCOUNTER — Other Ambulatory Visit: Payer: Self-pay | Admitting: Medical

## 2012-05-29 ENCOUNTER — Telehealth: Payer: Self-pay | Admitting: Oncology

## 2012-05-29 ENCOUNTER — Other Ambulatory Visit (HOSPITAL_BASED_OUTPATIENT_CLINIC_OR_DEPARTMENT_OTHER): Payer: PRIVATE HEALTH INSURANCE | Admitting: Lab

## 2012-05-29 ENCOUNTER — Ambulatory Visit (HOSPITAL_BASED_OUTPATIENT_CLINIC_OR_DEPARTMENT_OTHER): Payer: PRIVATE HEALTH INSURANCE | Admitting: Adult Health

## 2012-05-29 ENCOUNTER — Encounter: Payer: Self-pay | Admitting: Adult Health

## 2012-05-29 VITALS — BP 150/105 | HR 93 | Temp 98.4°F | Resp 20 | Ht 66.0 in | Wt 181.5 lb

## 2012-05-29 DIAGNOSIS — Z171 Estrogen receptor negative status [ER-]: Secondary | ICD-10-CM

## 2012-05-29 DIAGNOSIS — C50419 Malignant neoplasm of upper-outer quadrant of unspecified female breast: Secondary | ICD-10-CM

## 2012-05-29 DIAGNOSIS — C50919 Malignant neoplasm of unspecified site of unspecified female breast: Secondary | ICD-10-CM

## 2012-05-29 DIAGNOSIS — R141 Gas pain: Secondary | ICD-10-CM | POA: Diagnosis not present

## 2012-05-29 DIAGNOSIS — R142 Eructation: Secondary | ICD-10-CM

## 2012-05-29 LAB — COMPREHENSIVE METABOLIC PANEL (CC13)
ALT: 17 U/L (ref 0–55)
AST: 20 U/L (ref 5–34)
Albumin: 4.2 g/dL (ref 3.5–5.0)
BUN: 9 mg/dL (ref 7.0–26.0)
CO2: 28 mEq/L (ref 22–29)
Calcium: 10 mg/dL (ref 8.4–10.4)
Chloride: 106 mEq/L (ref 98–107)
Creatinine: 0.8 mg/dL (ref 0.6–1.1)
Potassium: 3.9 mEq/L (ref 3.5–5.1)

## 2012-05-29 LAB — CBC WITH DIFFERENTIAL/PLATELET
BASO%: 0.6 % (ref 0.0–2.0)
Basophils Absolute: 0 10*3/uL (ref 0.0–0.1)
EOS%: 0.9 % (ref 0.0–7.0)
HCT: 40.4 % (ref 34.8–46.6)
HGB: 14 g/dL (ref 11.6–15.9)
MONO#: 0.3 10*3/uL (ref 0.1–0.9)
NEUT#: 3.3 10*3/uL (ref 1.5–6.5)
NEUT%: 54.2 % (ref 38.4–76.8)
RDW: 12.5 % (ref 11.2–14.5)
WBC: 6.1 10*3/uL (ref 3.9–10.3)
lymph#: 2.4 10*3/uL (ref 0.9–3.3)

## 2012-05-29 NOTE — Progress Notes (Signed)
OFFICE PROGRESS NOTE  CC  KNAPP,EVE A, MD [address] [place] [address] Dr. Maisie Fus Cornett Dr. 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  PRIOR THERAPY:  #1 patient is status post lumpectomy every 2004 80 ER negative PR negative HER-2/neu negative stage I invasive ductal carcinoma.  #2 patient then received adjuvant chemotherapy consisting of Adriamycin Cytoxan given dose dense x4 cycles followed by Taxotere which she completed in September 2010.  #3 patient then went on to receive adjuvant radiation therapy completing in November 2010.  CURRENT THERAPY:Observation.  INTERVAL HISTORY: Olivia [name] 46 y.o. female returns for Followup visit today. Overall she is doing well.  She does note that she has had abd distention and bloating over the last couple of months.  She also has neck and back pain from a previously herniated disk.  Additionally, her left rib is poking out more than it normally does.    MEDICAL HISTORY: Past Medical History  Diagnosis Date  . Colon polyp   . Obesity   . Uterine fibroid     s/p embolization  . Lumbar degenerative disc disease   . History of breast cancer     invasive ductal R breast; s/p lumpectomy, radiation and chemo  . Obesity   . Breast cancer, stage 1 11/07/2011    ALLERGIES:  is allergic to adhesive and morphine.  MEDICATIONS:  Current Outpatient Prescriptions  Medication Sig Dispense Refill  . Calcium Carbonate-Vitamin D (CALCIUM + D) 600-200 MG-UNIT TABS Take 2 tablets by mouth daily.        . cyclobenzaprine (FLEXERIL) 10 MG tablet Take 10 mg by mouth 3 (three) times daily as needed.        [name] esomeprazole (NEXIUM) 40 MG capsule Take 40 mg by mouth daily before breakfast.      . furosemide (LASIX) 40 MG tablet Take 40 mg by mouth daily.      . furosemide (LASIX) 40 MG tablet TAKE 1 TABLET (40 MG TOTAL) BY MOUTH  DAILY.  30 tablet  2  . gabapentin (NEURONTIN) 600 MG tablet Take 600 mg by mouth at bedtime.      . Multiple Vitamins-Minerals (MULTIVITAMIN WITH MINERALS) tablet Take 1 tablet by mouth daily.        . nabumetone (RELAFEN) 500 MG tablet Take 500 mg by mouth 2 (two) times daily.        [name] NEXIUM 40 MG capsule TAKE 1 CAPSULE BY MOUTH ONCE A DAY  30 capsule  0  . oxyCODONE (OXYCONTIN) 20 MG 12 hr tablet Take 20 mg by mouth every 8 (eight) hours.      . potassium chloride SA (K-DUR,KLOR-CON) 20 MEQ tablet Take 20 mEq by mouth daily.          SURGICAL HISTORY:  Past Surgical History  Procedure Date  . Breast lumpectomy 2010    right  . Tubal ligation 2010  . Colonoscopy 2010  . Power port   . Lumbar laminectomy/decompression microdiscectomy 2007, 2008    Dr. Gerlene Fee x 2  . Uterine fibroid embolization 2008 or 2009    REVIEW OF SYSTEMS:  General: fatigue (-), night sweats (-), fever (-), pain (+) Lymph: palpable nodes (-) HEENT: vision changes (-), mucositis (-), gum bleeding (-), epistaxis (-) Cardiovascular: chest pain (-), palpitations (-) Pulmonary: shortness of breath (-), dyspnea on exertion (-), cough (-), hemoptysis (-) GI:  Early satiety (-), melena (-),  dysphagia (-), nausea/vomiting (-), diarrhea (-) GU: dysuria (-), hematuria (-), incontinence (-) Musculoskeletal: joint swelling (-), joint pain (-), back pain (-) Neuro: weakness (-), numbness (-), headache (-), confusion (-) Skin: Rash (-), lesions (-), dryness (-) Psych: depression (-), suicidal/homicidal ideation (-), feeling of hopelessness (-)   Health Maintenance  Mammogram: 07/2010 Colonoscopy: 06/2009 Bone Density Scan: 10/2011 Pap Smear: 11/2011 Eye Exam: 01/2012 Vitamin D Level: previously normal Lipid Panel: unknown   PHYSICAL EXAMINATION:  BP 150/105  Pulse 93  Temp 98.4 F (36.9 C) (Oral)  Resp 20  Ht 5\' 6"  (1.676 m)  Wt 181 lb 8 oz (82.328 kg)  BMI 29.29 kg/m2 General: Patient is a well  appearing female in no acute distress HEENT: PERRLA, sclerae anicteric no conjunctival pallor, MMM Neck: supple, no palpable adenopathy Lungs: clear to auscultation bilaterally, no wheezes, rhonchi, or rales Cardiovascular: regular rate rhythm, S1, S2, no murmurs, rubs or gallops Abdomen: Soft, non-tender, non-distended, normoactive bowel sounds, no HSM Extremities: warm and well perfused, no clubbing, cyanosis, or edema Skin: No rashes or lesions Neuro: Non-focal Breast examination left breast does reveal a well-healed surgical scar there is tenderness in the rib cage anteriorly. Right breast reveals well-healed surgical scar no masses are palpated no nipple discharge. ECOG PERFORMANCE STATUS: 1 - Symptomatic but completely ambulatory    LABORATORY DATA: Lab Results  Component Value Date   WBC 6.1 05/29/2012   HGB 14.0 05/29/2012   HCT 40.4 05/29/2012   MCV 90.6 05/29/2012   PLT 248 05/29/2012      RADIOGRAPHIC STUDIES:  No results found.  ASSESSMENT: 46 year old female with  #1 stage I ER/PR negative HER-2/neu negative node negative invasive ductal carcinoma of the right breast status post lumpectomy and every 2010 followed by adjuvant chemotherapy which she completed in September 2010 and followed by radiation completing in November 2010. She is without any clinical evidence of recurrent disease.  #2 patient's mammograms are up-to-date.  #3 Abd/pain, distention, chest wall abnormality     PLAN:   #1 Pt will have a CT C/A/P to evaluate this distention/chest wall protrusion.    #2 We will see her back in 6 months for follow up.    All questions were answered. The patient knows to call the clinic with any problems, questions or concerns. We can certainly see the patient much sooner if necessary.  I spent 25 minutes counseling the patient face to face. The total time spent in the appointment was 30 minutes.    Cherie Ouch Lyn Hollingshead, NP Medical Oncology [facility] Phone: [phone]    05/29/2012, 3:13 PM

## 2012-05-29 NOTE — Telephone Encounter (Signed)
gve the pt her may 2014 appt calendar along with the ct scan appt with instructions.

## 2012-05-29 NOTE — Patient Instructions (Signed)
Doing well.  We have scheduled a CT scan of your chest/abdomen and pelvis to evaluate your bloated and distended feeling.  Please call us if you have any questions or concerns.   Otherwise we will see you back in 6 months.

## 2012-06-04 ENCOUNTER — Ambulatory Visit (HOSPITAL_COMMUNITY)
Admission: RE | Admit: 2012-06-04 | Discharge: 2012-06-04 | Payer: PRIVATE HEALTH INSURANCE | Source: Ambulatory Visit | Attending: Adult Health | Admitting: Adult Health

## 2012-06-05 ENCOUNTER — Ambulatory Visit (HOSPITAL_COMMUNITY)
Admission: RE | Admit: 2012-06-05 | Discharge: 2012-06-05 | Disposition: A | Discharge status: Home / Self Care | Payer: PRIVATE HEALTH INSURANCE | Source: Ambulatory Visit | Attending: Adult Health | Admitting: Adult Health

## 2012-06-05 DIAGNOSIS — R079 Chest pain, unspecified: Secondary | ICD-10-CM | POA: Insufficient documentation

## 2012-06-05 DIAGNOSIS — R109 Unspecified abdominal pain: Secondary | ICD-10-CM | POA: Insufficient documentation

## 2012-06-05 DIAGNOSIS — C50919 Malignant neoplasm of unspecified site of unspecified female breast: Secondary | ICD-10-CM | POA: Diagnosis not present

## 2012-06-05 MED ORDER — IOHEXOL 300 MG/ML  SOLN
100.0000 mL | Freq: Once | INTRAMUSCULAR | Status: AC | PRN
  Administered 2012-06-05: 100 mL via INTRAVENOUS

## 2012-06-10 ENCOUNTER — Telehealth: Payer: Self-pay | Admitting: *Deleted

## 2012-06-10 NOTE — Telephone Encounter (Signed)
Pt called with request for CT Scan results from 06/05/12. Will review with MD. Pt has f/u q 6months  CB# [phone]

## 2012-06-10 NOTE — Telephone Encounter (Signed)
No evidence of cancer mets

## 2012-06-11 ENCOUNTER — Telehealth: Payer: Self-pay | Admitting: *Deleted

## 2012-06-11 DIAGNOSIS — L819 Disorder of pigmentation, unspecified: Secondary | ICD-10-CM | POA: Diagnosis not present

## 2012-06-11 DIAGNOSIS — L658 Other specified nonscarring hair loss: Secondary | ICD-10-CM | POA: Diagnosis not present

## 2012-06-11 DIAGNOSIS — D239 Other benign neoplasm of skin, unspecified: Secondary | ICD-10-CM | POA: Diagnosis not present

## 2012-06-11 NOTE — Telephone Encounter (Signed)
Per MD, notified pt no evidence of cancer mets. Pt verbalized understanding.

## 2012-06-14 ENCOUNTER — Other Ambulatory Visit: Payer: Self-pay | Admitting: Medical

## 2012-07-12 ENCOUNTER — Other Ambulatory Visit: Payer: Self-pay | Admitting: Family Medicine

## 2012-07-14 NOTE — Telephone Encounter (Signed)
done

## 2012-07-14 NOTE — Telephone Encounter (Signed)
Is this okay to refill? 

## 2012-07-15 ENCOUNTER — Other Ambulatory Visit: Payer: Self-pay | Admitting: Medical

## 2012-07-16 ENCOUNTER — Ambulatory Visit (INDEPENDENT_AMBULATORY_CARE_PROVIDER_SITE_OTHER): Payer: PRIVATE HEALTH INSURANCE | Admitting: Family Medicine

## 2012-07-16 ENCOUNTER — Encounter: Payer: Self-pay | Admitting: Family Medicine

## 2012-07-16 VITALS — BP 130/80 | HR 72 | Temp 98.2°F | Ht 66.0 in | Wt 180.0 lb

## 2012-07-16 DIAGNOSIS — R350 Frequency of micturition: Secondary | ICD-10-CM | POA: Diagnosis not present

## 2012-07-16 DIAGNOSIS — N390 Urinary tract infection, site not specified: Secondary | ICD-10-CM | POA: Diagnosis not present

## 2012-07-16 LAB — POCT URINALYSIS DIPSTICK
Bilirubin, UA: NEGATIVE
Glucose, UA: NEGATIVE
Ketones, UA: NEGATIVE
Nitrite, UA: NEGATIVE

## 2012-07-16 MED ORDER — FLUCONAZOLE 150 MG PO TABS: 150.0000 mg | ORAL_TABLET | Freq: Once | ORAL | Status: DC

## 2012-07-16 MED ORDER — CIPROFLOXACIN HCL 250 MG PO TABS: 250.0000 mg | ORAL_TABLET | Freq: Two times a day (BID) | ORAL | Status: DC

## 2012-07-16 NOTE — Progress Notes (Signed)
Chief Complaint  Patient presents with  . Urinary Tract Infection    urinary urgency, frequency and pressure since yesterday.    HPI:  Patient started yesterday with lower abdominal pressure, urinary frequency, urgency.  Denies dysuria, fevers, nausea, vomiting, hematuria.  Hasn't had a UTI in a long while.  Denies flank pain, but has some chronic back pain issues, unchanged.  Past Medical History  Diagnosis Date  . Colon polyp   . Obesity   . Uterine fibroid     s/p embolization  . Lumbar degenerative disc disease   . History of breast cancer     invasive ductal R breast; s/p lumpectomy, radiation and chemo  . Obesity   . Breast cancer, stage 1 11/07/2011   History   Social History  . Marital Status: Married    Spouse Name: N/A    Number of Children: N/A  . Years of Education: N/A   Occupational History  . Not on file.   Social History Main Topics  . Smoking status: Never Smoker   . Smokeless tobacco: Never Used  . Alcohol Use: Yes     Comment: ON OCCASION  . Drug Use: No  . Sexually Active: Yes   Other Topics Concern  . Not on file   Social History Narrative  . No narrative on file   Current outpatient prescriptions:Calcium Carbonate-Vitamin D (CALCIUM + D) 600-200 MG-UNIT TABS, Take 2 tablets by mouth daily.  , Disp: , Rfl: ;  cyclobenzaprine (FLEXERIL) 10 MG tablet, Take 10 mg by mouth 3 (three) times daily as needed.  , Disp: , Rfl: ;  esomeprazole (NEXIUM) 40 MG capsule, Take 40 mg by mouth daily before breakfast., Disp: , Rfl:  furosemide (LASIX) 40 MG tablet, TAKE 1 TABLET (40 MG TOTAL) BY MOUTH DAILY., Disp: 30 tablet, Rfl: 5;  gabapentin (NEURONTIN) 600 MG tablet, Take 600 mg by mouth at bedtime., Disp: , Rfl: ;  Multiple Vitamins-Minerals (MULTIVITAMIN WITH MINERALS) tablet, Take 1 tablet by mouth daily.  , Disp: , Rfl: ;  nabumetone (RELAFEN) 500 MG tablet, Take 500 mg by mouth 2 (two) times daily.  , Disp: , Rfl:  NEXIUM 40 MG capsule, TAKE 1 CAPSULE BY MOUTH  ONCE A DAY, Disp: 30 capsule, Rfl: 0;  oxyCODONE (OXYCONTIN) 20 MG 12 hr tablet, Take 20 mg by mouth every 8 (eight) hours., Disp: , Rfl: ;  potassium chloride SA (K-DUR,KLOR-CON) 20 MEQ tablet, Take 20 mEq by mouth daily.  , Disp: , Rfl:   Allergies  Allergen Reactions  . Adhesive (Tape) Hives  . Morphine Hives   ROS:  Denies fevers, nausea, vomiting, URI symptoms, chest pain, shortness of breath. Edema controlled with lasix.  No skin rash, bleeding or other concerns.  PHYSICAL EXAM: BP 130/80  Pulse 72  Temp 98.2 F (36.8 C) (Oral)  Ht 5\' 6"  (1.676 m)  Wt 180 lb (81.647 kg)  BMI 29.05 kg/m2 Well developed, pleasant female in no distress Abdomen: Mild suprapubic tenderness. No rebound tenderness, guarding, mass or organomegaly. Back: No CVA tenderness  Urine dip: 2+ blood, 3+ leuks, 1+ protein  ASSESSMENT/PLAN:  1. Urinary frequency  POCT Urinalysis Dipstick  2. Urinary tract infection, site not specified  Urine culture, ciprofloxacin (CIPRO) 250 MG tablet   Reviewed causes of UTI's, proper hygiene, voiding after intercourse; discussed cranberry and how/when it could potentially be helpful.    rx cipro, will contact with culture results when available.  Due to h/o yeast infections with ABX, Diflucan  rx also sent to pharmacy, to be used prn  Reviewed signs/symptoms of kidney infection and to return immediately should these develop

## 2012-07-16 NOTE — Patient Instructions (Addendum)
Take antibiotic twice daily.  Expect symptoms to improve in the next 24-48 hours.  If you develop fevers, vomiting, kidney/flank pain, please call us.  Otherwise, we should have culture results in the next 3-4 days  Drink plenty of fluids

## 2012-07-17 DIAGNOSIS — M5412 Radiculopathy, cervical region: Secondary | ICD-10-CM | POA: Diagnosis not present

## 2012-07-19 LAB — URINE CULTURE: Colony Count: >=100000

## 2012-07-21 DIAGNOSIS — IMO0002 Reserved for concepts with insufficient information to code with codable children: Secondary | ICD-10-CM | POA: Diagnosis not present

## 2012-07-21 DIAGNOSIS — M5126 Other intervertebral disc displacement, lumbar region: Secondary | ICD-10-CM | POA: Diagnosis not present

## 2012-08-13 DIAGNOSIS — N950 Postmenopausal bleeding: Secondary | ICD-10-CM | POA: Diagnosis not present

## 2012-08-15 ENCOUNTER — Encounter (INDEPENDENT_AMBULATORY_CARE_PROVIDER_SITE_OTHER): Payer: Self-pay | Admitting: Surgery

## 2012-08-17 ENCOUNTER — Other Ambulatory Visit: Payer: Self-pay | Admitting: Medical

## 2012-08-19 ENCOUNTER — Other Ambulatory Visit: Payer: Self-pay | Admitting: Obstetrics and Gynecology

## 2012-08-19 DIAGNOSIS — Z853 Personal history of malignant neoplasm of breast: Secondary | ICD-10-CM

## 2012-09-02 ENCOUNTER — Other Ambulatory Visit: Payer: Self-pay | Admitting: Family Medicine

## 2012-09-02 NOTE — Telephone Encounter (Signed)
IS THIS OK I THINK SHE IS YOUR PT

## 2012-09-03 ENCOUNTER — Ambulatory Visit
Admission: RE | Admit: 2012-09-03 | Discharge: 2012-09-03 | Disposition: A | Discharge status: Home / Self Care | Payer: PRIVATE HEALTH INSURANCE | Source: Ambulatory Visit | Attending: Obstetrics and Gynecology | Admitting: Obstetrics and Gynecology

## 2012-09-03 DIAGNOSIS — R928 Other abnormal and inconclusive findings on diagnostic imaging of breast: Secondary | ICD-10-CM | POA: Diagnosis not present

## 2012-09-03 DIAGNOSIS — Z853 Personal history of malignant neoplasm of breast: Secondary | ICD-10-CM

## 2012-09-03 DIAGNOSIS — N6489 Other specified disorders of breast: Secondary | ICD-10-CM | POA: Diagnosis not present

## 2012-09-17 ENCOUNTER — Other Ambulatory Visit: Payer: Self-pay | Admitting: Medical

## 2012-10-01 DIAGNOSIS — M48061 Spinal stenosis, lumbar region without neurogenic claudication: Secondary | ICD-10-CM | POA: Diagnosis not present

## 2012-10-01 DIAGNOSIS — M5126 Other intervertebral disc displacement, lumbar region: Secondary | ICD-10-CM | POA: Diagnosis not present

## 2012-10-15 ENCOUNTER — Encounter: Payer: Self-pay | Admitting: Family Medicine

## 2012-10-15 ENCOUNTER — Ambulatory Visit (INDEPENDENT_AMBULATORY_CARE_PROVIDER_SITE_OTHER): Payer: PRIVATE HEALTH INSURANCE | Admitting: Family Medicine

## 2012-10-15 VITALS — BP 138/78 | HR 72 | Temp 98.2°F | Ht 66.0 in | Wt 174.0 lb

## 2012-10-15 DIAGNOSIS — J069 Acute upper respiratory infection, unspecified: Secondary | ICD-10-CM | POA: Diagnosis not present

## 2012-10-15 DIAGNOSIS — J029 Acute pharyngitis, unspecified: Secondary | ICD-10-CM | POA: Diagnosis not present

## 2012-10-15 DIAGNOSIS — M5020 Other cervical disc displacement, unspecified cervical region: Secondary | ICD-10-CM | POA: Diagnosis not present

## 2012-10-15 DIAGNOSIS — M5412 Radiculopathy, cervical region: Secondary | ICD-10-CM | POA: Diagnosis not present

## 2012-10-15 LAB — POCT RAPID STREP A (OFFICE): Rapid Strep A Screen: NEGATIVE

## 2012-10-15 NOTE — Patient Instructions (Addendum)
Look at the ingredients in your medications--I believe you are getting decongestants from multiple sources which can be dangerous. STOP the Advil cold and sinus, AND the alka selzer plus cold. CONTINUE the Mucinex-D twice daily. ADD claritin or zyrtec--to try and dry up and drainage down the back of the throat that may be contributing to sore throat. Use tylenol and/or ibuprofen or aleve (either ibuprofen OR aleve, and you can take these with tylenol)--use this as needed for headache and throat pain Consider trying chloraseptic spray and salt water gargles for throat pain  Call/return if you develop fevers, persistent discolored mucus or any other new or worsening symptoms

## 2012-10-15 NOTE — Progress Notes (Signed)
Chief Complaint  Patient presents with  . Sore Throat    started Friday night, woke up Saturday feeling terrible. HA started Sunday. Has been around sick granchild and would like to be checked for strep.   Started feeling bad on Friday night (5 days ago), and then 4 days ago she woke up with her "throat on fire".  Started with a headache 3 days ago, across her forehead, but today her head hurts all over.  3 days ago also started with nasal stuffiness.  Denies runny nose.  Initially was blowing out some thick yellow mucus, but this is now better--clear, and thinner.  She has now started coughing, phlegm is thick, yellowish.  Denies fevers, shortness of breath.  +sick contacts recently.  She used Mucinex-D which has helped some (allowing phlegm to come up better). Still has sore throat.  She also has used Financial trader Plus Cold nighttime (at night), and Advil Cold and Sinus (during the day). She is frustrated about her headache and not feeling well.  Getting injection in neck (epidural) today.  Past Medical History  Diagnosis Date  . Colon polyp   . Obesity   . Uterine fibroid     s/p embolization  . Lumbar degenerative disc disease   . History of breast cancer     invasive ductal R breast; s/p lumpectomy, radiation and chemo  . Obesity   . Breast cancer, stage 1 11/07/2011   Past Surgical History  Procedure Laterality Date  . Breast lumpectomy  2010    right  . Tubal ligation  2010  . Colonoscopy  2010  . Power port    . Lumbar laminectomy/decompression microdiscectomy  2007, 2008    Dr. Gerlene Fee x 2  . Uterine fibroid embolization  2008 or 2009   History   Social History  . Marital Status: Married    Spouse Name: N/A    Number of Children: N/A  . Years of Education: N/A   Occupational History  . Not on file.   Social History Main Topics  . Smoking status: Never Smoker   . Smokeless tobacco: Never Used  . Alcohol Use: Yes     Comment: ON OCCASION  . Drug Use: No  .  Sexually Active: Yes   Other Topics Concern  . Not on file   Social History Narrative  . No narrative on file   Current Outpatient Prescriptions on File Prior to Visit  Medication Sig Dispense Refill  . Calcium Carbonate-Vitamin D (CALCIUM + D) 600-200 MG-UNIT TABS Take 2 tablets by mouth daily.        . cyclobenzaprine (FLEXERIL) 10 MG tablet Take 10 mg by mouth 3 (three) times daily as needed.        [name] esomeprazole (NEXIUM) 40 MG capsule Take 40 mg by mouth daily before breakfast.      . furosemide (LASIX) 40 MG tablet TAKE 1 TABLET (40 MG TOTAL) BY MOUTH DAILY.  30 tablet  5  . gabapentin (NEURONTIN) 600 MG tablet Take 600 mg by mouth at bedtime.      [name] KLOR-CON M20 20 MEQ tablet TAKE 1 TABLET BY MOUTH TWICE DAILY  60 tablet  3  . Multiple Vitamins-Minerals (MULTIVITAMIN WITH MINERALS) tablet Take 1 tablet by mouth daily.        . nabumetone (RELAFEN) 500 MG tablet Take 500 mg by mouth 2 (two) times daily.        [name] oxyCODONE (OXYCONTIN) 20 MG 12 hr tablet Take  20 mg by mouth every 8 (eight) hours.       No current facility-administered medications on file prior to visit.   Allergies  Allergen Reactions  . Adhesive (Tape) Hives  . Morphine Hives   ROS:  Denies fevers, shortness of breath, chest pain, nausea, vomiting, diarrhea, rash.  +headaches, neck pain.  PHYSICAL EXAM: BP 138/78  Pulse 72  Temp(Src) 98.2 F (36.8 C) (Oral)  Ht 5\' 6"  (1.676 m)  Wt 174 lb (78.926 kg)  BMI 28.1 kg/m2 Well developed female, in no distress.  Mildly nasal sounding HEENT:  Conjunctiva noninjected. Nasal mucosa moderately edematous, not erythematous, no purulence (somewhat pale mucosa). Sinuses nontender.  OP clear without ulcers, lesions or erythema. Neck: no lymphadenopathy or mass Heart: regular rate and rhythm without murmur Lungs: clear bilaterally Skin: no rash  Rapid strep negative  ASSESSMENT/PLAN:  Sore throat - Plan: Rapid Strep A  Acute upper respiratory infections of  unspecified site  Look at the ingredients in your medications--I believe you are getting decongestants from multiple sources which can be dangerous. STOP the Advil cold and sinus, AND the alka selzer plus cold. CONTINUE the Mucinex-D twice daily. ADD claritin or zyrtec--to try and dry up and drainage down the back of the throat that may be contributing to sore throat. Use tylenol and/or ibuprofen or aleve (either ibuprofen OR aleve, and you can take these with tylenol)--use this as needed for headache and throat pain Consider trying chloraseptic spray and salt water gargles for throat pain  Call/return if you develop fevers, persistent discolored mucus or any other new or worsening symptoms  Pt advised to let doctor this afternoon know that she took Advil Cold and Sinus (as she was told not to take ibuprofen prior to her epidural injection). She had been offered Toradol injection (because pt was very upset and frustrated that she wasn't given anything to help her headache/sore throat), but that was when she informed me of the scheduled epidural injection later today, so offer was rescinded.

## 2012-11-24 DIAGNOSIS — M5020 Other cervical disc displacement, unspecified cervical region: Secondary | ICD-10-CM | POA: Diagnosis not present

## 2012-12-01 ENCOUNTER — Ambulatory Visit (INDEPENDENT_AMBULATORY_CARE_PROVIDER_SITE_OTHER): Payer: PRIVATE HEALTH INSURANCE | Admitting: Medical

## 2012-12-01 ENCOUNTER — Encounter: Payer: Self-pay | Admitting: Medical

## 2012-12-01 VITALS — BP 130/80 | HR 82 | Temp 98.2°F | Resp 16 | Wt 175.0 lb

## 2012-12-01 DIAGNOSIS — J329 Chronic sinusitis, unspecified: Secondary | ICD-10-CM | POA: Diagnosis not present

## 2012-12-01 DIAGNOSIS — H9209 Otalgia, unspecified ear: Secondary | ICD-10-CM | POA: Diagnosis not present

## 2012-12-01 DIAGNOSIS — H9201 Otalgia, right ear: Secondary | ICD-10-CM

## 2012-12-01 MED ORDER — AMOXICILLIN 875 MG PO TABS: 875.0000 mg | ORAL_TABLET | Freq: Two times a day (BID) | ORAL | Status: DC

## 2012-12-01 NOTE — Progress Notes (Signed)
Subjective:  Olivia [name] is a 47 y.o. female who presents for sore throat, head congestion, headache, nasal congestion, can't breath through nose, worse at night, right ear pain stuffy x 3 days, worsening.  She reports last several days with right facial pain and ear pain.  Has had some green nasal discharge, some blood tinged nasal discharge this morning.  Denies fever,sneezing, no NVD.  was seen in March here for viral URI.  Patient is a non-smoker.  Using Advil cold and sinus for symptoms.  Denies sick contacts.  No other aggravating or relieving factors.  No other c/o.  Past Medical History  Diagnosis Date  . Colon polyp   . Obesity   . Uterine fibroid     s/p embolization  . Lumbar degenerative disc disease   . History of breast cancer     invasive ductal R breast; s/p lumpectomy, radiation and chemo  . Obesity   . Breast cancer, stage 1 11/07/2011   ROS as in subjective   Objective: Filed Vitals:   12/01/12 1423  BP: 130/80  Pulse: 82  Temp: 98.2 F (36.8 C)  Resp: 16    General appearance: Alert, WD/WN, no distress                             Skin: warm, no rash                           Head: + right sinus tenderness,                            Eyes: conjunctiva normal, corneas clear, PERRLA                            Ears: retractedTMs, mild erythema bilat, external ear canals normal                          Nose: septum midline, turbinates swollen on right, with erythema, no discharge             Mouth/throat: MMM, tongue normal, mild pharyngeal erythema                           Neck: supple, no adenopathy, no thyromegaly, nontender                          lungs: CTA bilaterally, no wheezes, rales, or rhonchi      Assessment and Plan:   Encounter Diagnoses  Name Primary?  . Sinusitis Yes  . Otalgia of right ear     Prescription given for Amoxicillin.  Can use OTC Mucinex for congestion.  Tylenol or Ibuprofen OTC for fever and malaise.  Discussed  symptomatic relief, nasal saline. Once this clears up, if still ongoing sore throat, congestion, then begin Zyrtec at bedtime since the pollen could be a factor, although she doesn't have significant allergy symptoms.  Call or return if worse or not improving in 2-3 days.

## 2012-12-01 NOTE — Patient Instructions (Signed)
Begin antibiotic Amoxicillin, increase water intake, consider 3-5 days of Mucinex plain OTC.  Once you finish antibiotic, if you still feel headache, head congestion, nasal stuffiness, sore throat, then begin Zyrtec daily at bedtime for the next 3-4 weeks.

## 2012-12-03 ENCOUNTER — Other Ambulatory Visit (HOSPITAL_BASED_OUTPATIENT_CLINIC_OR_DEPARTMENT_OTHER): Payer: PRIVATE HEALTH INSURANCE | Admitting: Lab

## 2012-12-03 ENCOUNTER — Ambulatory Visit (HOSPITAL_BASED_OUTPATIENT_CLINIC_OR_DEPARTMENT_OTHER): Payer: PRIVATE HEALTH INSURANCE | Admitting: Adult Health

## 2012-12-03 ENCOUNTER — Encounter: Payer: Self-pay | Admitting: Adult Health

## 2012-12-03 ENCOUNTER — Telehealth: Payer: Self-pay | Admitting: *Deleted

## 2012-12-03 VITALS — BP 162/105 | HR 105 | Temp 98.7°F | Resp 20 | Ht 66.0 in | Wt 178.0 lb

## 2012-12-03 DIAGNOSIS — C50919 Malignant neoplasm of unspecified site of unspecified female breast: Secondary | ICD-10-CM

## 2012-12-03 DIAGNOSIS — C50911 Malignant neoplasm of unspecified site of right female breast: Secondary | ICD-10-CM

## 2012-12-03 LAB — COMPREHENSIVE METABOLIC PANEL (CC13)
ALT: 17 U/L (ref 0–55)
AST: 16 U/L (ref 5–34)
Alkaline Phosphatase: 92 U/L (ref 40–150)
BUN: 8.7 mg/dL (ref 7.0–26.0)
Chloride: 105 mEq/L (ref 98–107)
Creatinine: 0.8 mg/dL (ref 0.6–1.1)
Total Bilirubin: 0.32 mg/dL (ref 0.20–1.20)

## 2012-12-03 LAB — CBC WITH DIFFERENTIAL/PLATELET
BASO%: 0.6 % (ref 0.0–2.0)
EOS%: 1.3 % (ref 0.0–7.0)
HCT: 42.6 % (ref 34.8–46.6)
MCH: 31.3 pg (ref 25.1–34.0)
MCHC: 34.1 g/dL (ref 31.5–36.0)
MCV: 91.7 fL (ref 79.5–101.0)
MONO%: 7.3 % (ref 0.0–14.0)
NEUT%: 55.4 % (ref 38.4–76.8)
lymph#: 2.3 10*3/uL (ref 0.9–3.3)

## 2012-12-03 NOTE — Progress Notes (Signed)
OFFICE PROGRESS NOTE  CC  [name],Olivia A, MD [address] [place] [address] Dr. Maisie Fus Cornett Dr. 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  PRIOR THERAPY:  #1 patient is status post lumpectomy every 2004 80 ER negative PR negative HER-2/neu negative stage I invasive ductal carcinoma.  #2 patient then received adjuvant chemotherapy consisting of Adriamycin Cytoxan given dose dense x4 cycles followed by Taxotere which she completed in September 2010.  #3 patient then went on to receive adjuvant radiation therapy completing in November 2010.  CURRENT THERAPY:Observation.  INTERVAL HISTORY: Olivia [name] 47 y.o. female returns for Followup visit today. Overall she is doing well.  She does have a sinus infection and laryngitis, but is otherwise without questions/concerns.  We updated her health maintenance below.  She had her mammogram earlier this year and no abnormality was detected.   MEDICAL HISTORY: Past Medical History  Diagnosis Date  . Colon polyp   . Obesity   . Uterine fibroid     s/p embolization  . Lumbar degenerative disc disease   . History of breast cancer     invasive ductal R breast; s/p lumpectomy, radiation and chemo  . Obesity   . Breast cancer, stage 1 11/07/2011    ALLERGIES:  is allergic to adhesive and morphine.  MEDICATIONS:  Current Outpatient Prescriptions  Medication Sig Dispense Refill  . amoxicillin (AMOXIL) 875 MG tablet Take 1 tablet (875 mg total) by mouth 2 (two) times daily.  20 tablet  0  . Calcium Carbonate-Vitamin D (CALCIUM + D) 600-200 MG-UNIT TABS Take 2 tablets by mouth daily.        . cyclobenzaprine (FLEXERIL) 10 MG tablet Take 10 mg by mouth 3 (three) times daily as needed.        [name] esomeprazole (NEXIUM) 40 MG capsule Take 40 mg by mouth daily before breakfast.      . furosemide (LASIX) 40 MG tablet TAKE 1 TABLET  (40 MG TOTAL) BY MOUTH DAILY.  30 tablet  5  . gabapentin (NEURONTIN) 600 MG tablet Take 600 mg by mouth at bedtime.      [name] KLOR-CON M20 20 MEQ tablet TAKE 1 TABLET BY MOUTH TWICE DAILY  60 tablet  3  . Multiple Vitamins-Minerals (MULTIVITAMIN WITH MINERALS) tablet Take 1 tablet by mouth daily.        . nabumetone (RELAFEN) 500 MG tablet Take 500 mg by mouth 2 (two) times daily.        [name] oxyCODONE (OXYCONTIN) 20 MG 12 hr tablet Take 20 mg by mouth every 8 (eight) hours.       No current facility-administered medications for this visit.    SURGICAL HISTORY:  Past Surgical History  Procedure Laterality Date  . Breast lumpectomy  2010    right  . Tubal ligation  2010  . Colonoscopy  2010  . Power port    . Lumbar laminectomy/decompression microdiscectomy  2007, 2008    Dr. Gerlene Fee x 2  . Uterine fibroid embolization  2008 or 2009    REVIEW OF SYSTEMS:  General: fatigue (-), night sweats (-), fever (-), pain (-) Lymph: palpable nodes (-) HEENT: vision changes (-), mucositis (-), gum bleeding (-), epistaxis (-) Cardiovascular: chest pain (-), palpitations (-) Pulmonary: shortness of breath (-), dyspnea on exertion (-), cough (-), hemoptysis (-) GI:  Early satiety (-), melena (-), dysphagia (-), nausea/vomiting (-), diarrhea (-) GU: dysuria (-),  hematuria (-), incontinence (-) Musculoskeletal: joint swelling (-), joint pain (-), back pain (-) Neuro: weakness (-), numbness (-), headache (-), confusion (-) Skin: Rash (-), lesions (-), dryness (-) Psych: depression (-), suicidal/homicidal ideation (-), feeling of hopelessness (-)   Health Maintenance  Mammogram: 2/14 Colonoscopy: 06/2009 Bone Density Scan: 10/2011 Pap Smear: 11/2011 Eye Exam: 01/2012 Vitamin D Level: previously normal Lipid Panel: unknown   PHYSICAL EXAMINATION:  BP 162/105  Pulse 105  Temp(Src) 98.7 F (37.1 C) (Oral)  Resp 20  Ht 5\' 6"  (1.676 m)  Wt 178 lb (80.74 kg)  BMI 28.74 kg/m2 General: Patient  is a well appearing female in no acute distress HEENT: PERRLA, sclerae anicteric no conjunctival pallor, MMM Neck: supple, no palpable adenopathy Lungs: clear to auscultation bilaterally, no wheezes, rhonchi, or rales Cardiovascular: regular rate rhythm, S1, S2, no murmurs, rubs or gallops Abdomen: Soft, non-tender, non-distended, normoactive bowel sounds, no HSM Extremities: warm and well perfused, no clubbing, cyanosis, or edema Skin: No rashes or lesions Neuro: Non-focal Breast examination left breast does reveal a well-healed surgical scar there is tenderness in the rib cage anteriorly. Right breast reveals well-healed surgical scar no masses are palpated no nipple discharge. ECOG PERFORMANCE STATUS: 1 - Symptomatic but completely ambulatory    LABORATORY DATA: Lab Results  Component Value Date   WBC 6.6 12/03/2012   HGB 14.5 12/03/2012   HCT 42.6 12/03/2012   MCV 91.7 12/03/2012   PLT 288 12/03/2012      RADIOGRAPHIC STUDIES:  No results found.  ASSESSMENT: 47 year old female with  #1 stage I ER/PR negative HER-2/neu negative node negative invasive ductal carcinoma of the right breast status post lumpectomy and every 2010 followed by adjuvant chemotherapy which she completed in September 2010 and followed by radiation completing in November 2010. She is without any clinical evidence of recurrent disease.  #2 patient's mammograms are up-to-date.  #3 Abd/pain, distention, chest wall abnormality     PLAN:   #1 Doing well.  No sign of recurrence.  We discussed survivorship in detail.    #2 We will see her back in 6 months for follow up.    All questions were answered. The patient knows to call the clinic with any problems, questions or concerns. We can certainly see the patient much sooner if necessary.  I spent 25 minutes counseling the patient face to face. The total time spent in the appointment was 30 minutes.   Cherie Ouch Lyn Hollingshead, NP Medical Oncology [facility] Phone: [phone] 12/03/2012, 3:13 PM

## 2012-12-03 NOTE — Telephone Encounter (Signed)
appts made and printed...td 

## 2012-12-03 NOTE — Patient Instructions (Addendum)
Doing well.  No sign of recurrence.  We will see you back in 6 months.  Please call us if you have any questions or concerns.    

## 2012-12-10 DIAGNOSIS — M5020 Other cervical disc displacement, unspecified cervical region: Secondary | ICD-10-CM | POA: Diagnosis not present

## 2012-12-10 DIAGNOSIS — M5412 Radiculopathy, cervical region: Secondary | ICD-10-CM | POA: Diagnosis not present

## 2012-12-13 ENCOUNTER — Other Ambulatory Visit: Payer: Self-pay | Admitting: Medical

## 2012-12-15 ENCOUNTER — Encounter (INDEPENDENT_AMBULATORY_CARE_PROVIDER_SITE_OTHER): Payer: Self-pay | Admitting: Surgery

## 2012-12-15 ENCOUNTER — Ambulatory Visit (INDEPENDENT_AMBULATORY_CARE_PROVIDER_SITE_OTHER): Payer: PRIVATE HEALTH INSURANCE | Admitting: Surgery

## 2012-12-15 ENCOUNTER — Other Ambulatory Visit (INDEPENDENT_AMBULATORY_CARE_PROVIDER_SITE_OTHER): Payer: Self-pay

## 2012-12-15 ENCOUNTER — Other Ambulatory Visit (INDEPENDENT_AMBULATORY_CARE_PROVIDER_SITE_OTHER): Payer: Self-pay | Admitting: Surgery

## 2012-12-15 ENCOUNTER — Telehealth (INDEPENDENT_AMBULATORY_CARE_PROVIDER_SITE_OTHER): Payer: Self-pay | Admitting: General Surgery

## 2012-12-15 VITALS — BP 138/82 | HR 98 | Temp 97.9°F | Resp 18 | Ht 66.0 in | Wt 178.0 lb

## 2012-12-15 DIAGNOSIS — N6310 Unspecified lump in the right breast, unspecified quadrant: Secondary | ICD-10-CM

## 2012-12-15 DIAGNOSIS — N6001 Solitary cyst of right breast: Secondary | ICD-10-CM

## 2012-12-15 DIAGNOSIS — Z853 Personal history of malignant neoplasm of breast: Secondary | ICD-10-CM

## 2012-12-15 DIAGNOSIS — N6009 Solitary cyst of unspecified breast: Secondary | ICD-10-CM

## 2012-12-15 DIAGNOSIS — N4283 Cyst of prostate: Secondary | ICD-10-CM

## 2012-12-15 NOTE — Patient Instructions (Signed)
Refer to breast center for U/S right breast for cyst.  In ok,  Return 1 year.

## 2012-12-15 NOTE — Telephone Encounter (Signed)
Left message for patient to contact office  Appt for mammogram and u/s at [facility]  Is 12/26/12 at 145 pm

## 2012-12-15 NOTE — Progress Notes (Signed)
NAME: Olivia [name]       DOB: [birth year]           DATE: 12/15/2012       MRN: [id number]   Olivia [name] is a 47 y.o.Marland Kitchenfemale who presents for routine followup of her  stage 1 right breast cancerdiagnosed in 2012 and treated with breast conservation therapy ,  Radiation and chemotherapy. She has no problems or concerns on either side.  PFSH: She has had no significant changes since the last visit here.  ROS: There have been no significant changes since the last visit here  She does have some confusion since chemotherapy but this is improving.  EXAM: General: The patient is alert, oriented, generally healty appearing, NAD. Mood and affect are normal.  Breasts:  Scaring noted right breast at lumpectomy site right axilla normal.  Right breast at 6 o'clock feels full.  Hx of cyst at this site but feels more pronounced at this point. Old scar at this site.  Left side normal except for scar. Lymphatics: She has no axillary or supraclavicular adenopathy on either side.  Extremities: Full ROM of the surgical side with no lymphedema noted.  Data Reviewed: Clinical Data: Malignant lumpectomy of the right breast in  February, 2010 with subsequent chemotherapy and radiation therapy.  Prior history of benign excisional biopsy of the left breast in  2005. Annual reevaluation.  DIGITAL DIAGNOSTIC BILATERAL MAMMOGRAM WITH CAD  Comparison: Mammography 08/30/2011, 08/28/2010, dating back to  09/24/2007. Right breast ultrasound 08/26/2009.  Findings:  ACR Breast Density Category 2: There is a scattered fibroglandular  pattern.  CC and MLO views of both breasts, a spot tangential view of the  right breast at the lumpectomy site, and spot compression views of  the left breast in the [facility] projection were obtained. Post  lumpectomy scar and developing dystrophic calcifications in the  upper outer right breast. Circumscribed, partially obscured mass  in the posterior third of the right breast,  visualized on the CC  view, shown previously to represent a cyst at the 6 o'clock  position on the ultrasound; this has decreased in size since the  prior mammogram from 2011. Dystrophic calcifications of fat  necrosis in the retroareolar right breast. A focal density in the  central left breast was shown on the spot compression views to  represent overlapping fibroglandular tissue. No new suspicious  mass, nonsurgical architectural distortion or suspicious  calcifications in either breast.  Mammographic images were processed with CAD.  IMPRESSION:  No specific mammographic evidence of malignancy. Post lumpectomy  scarring and dystrophic calcifications in the upper outer right  breast.  RECOMMENDATION:  Bilateral diagnostic mammography in 1 year.  The patient was encouraged to perform monthly self breast  examination and communicate any changes with her primary physician.  The patient also questioned whether or not aspiration of the right  breast cyst was necessary; she was counseled that this could be  performed should be cyst become painful or increase in size.  I have discussed the findings and recommendations with the patient.  Results were also provided in writing at the conclusion of the  visit.  BI-RADS CATEGORY 2: Benign finding(s).  Original Report Authenticated By: Hulan Saas, M.D.   Impression: Fullness at right breast 6 o'clock.    Plan: Will continue to follow up on an annual basis here. Check u/s right breast since history  Of cyst at this site. It feels bigger on exam.  If ok,  Return 1 year.  May need to be aspirated.

## 2012-12-16 ENCOUNTER — Telehealth (INDEPENDENT_AMBULATORY_CARE_PROVIDER_SITE_OTHER): Payer: Self-pay | Admitting: General Surgery

## 2012-12-16 NOTE — Telephone Encounter (Signed)
Patient called she states she cannot keep appt on 12/26/12 for mammogram and u/s  I gave her number for breast center  She will call and  Make a new appt

## 2012-12-17 DIAGNOSIS — L658 Other specified nonscarring hair loss: Secondary | ICD-10-CM | POA: Diagnosis not present

## 2012-12-17 DIAGNOSIS — L089 Local infection of the skin and subcutaneous tissue, unspecified: Secondary | ICD-10-CM | POA: Diagnosis not present

## 2012-12-26 ENCOUNTER — Other Ambulatory Visit: Payer: PRIVATE HEALTH INSURANCE

## 2012-12-30 ENCOUNTER — Other Ambulatory Visit (INDEPENDENT_AMBULATORY_CARE_PROVIDER_SITE_OTHER): Payer: Self-pay | Admitting: Surgery

## 2012-12-30 ENCOUNTER — Other Ambulatory Visit: Payer: Self-pay | Admitting: Surgery

## 2012-12-30 ENCOUNTER — Ambulatory Visit
Admission: RE | Admit: 2012-12-30 | Discharge: 2012-12-30 | Disposition: A | Discharge status: Home / Self Care | Payer: PRIVATE HEALTH INSURANCE | Source: Ambulatory Visit | Attending: Surgery | Admitting: Surgery

## 2012-12-30 DIAGNOSIS — N6001 Solitary cyst of right breast: Secondary | ICD-10-CM

## 2012-12-30 DIAGNOSIS — N6310 Unspecified lump in the right breast, unspecified quadrant: Secondary | ICD-10-CM

## 2012-12-31 ENCOUNTER — Ambulatory Visit
Admission: RE | Admit: 2012-12-31 | Discharge: 2012-12-31 | Disposition: A | Discharge status: Home / Self Care | Payer: PRIVATE HEALTH INSURANCE | Source: Ambulatory Visit | Attending: Surgery | Admitting: Surgery

## 2012-12-31 ENCOUNTER — Other Ambulatory Visit: Payer: Self-pay | Admitting: Surgery

## 2012-12-31 ENCOUNTER — Other Ambulatory Visit: Payer: PRIVATE HEALTH INSURANCE

## 2012-12-31 DIAGNOSIS — N6310 Unspecified lump in the right breast, unspecified quadrant: Secondary | ICD-10-CM

## 2012-12-31 HISTORY — PX: BREAST BIOPSY: SHX20

## 2013-01-07 DIAGNOSIS — M5412 Radiculopathy, cervical region: Secondary | ICD-10-CM | POA: Diagnosis not present

## 2013-01-07 DIAGNOSIS — M5020 Other cervical disc displacement, unspecified cervical region: Secondary | ICD-10-CM | POA: Diagnosis not present

## 2013-01-15 ENCOUNTER — Telehealth: Payer: Self-pay | Admitting: *Deleted

## 2013-01-15 ENCOUNTER — Other Ambulatory Visit: Payer: Self-pay | Admitting: Family Medicine

## 2013-01-15 NOTE — Telephone Encounter (Signed)
Med check is fine

## 2013-01-15 NOTE — Telephone Encounter (Signed)
Called patient and left message letting her know that I called in her lasix but I need her to call and schedule med check.

## 2013-01-15 NOTE — Telephone Encounter (Signed)
This refill came in. I need to call her and get her scheduled for an OV. Would a med check be okay or would you prefer a CPE?

## 2013-02-26 DIAGNOSIS — M5412 Radiculopathy, cervical region: Secondary | ICD-10-CM | POA: Diagnosis not present

## 2013-02-26 DIAGNOSIS — M5126 Other intervertebral disc displacement, lumbar region: Secondary | ICD-10-CM | POA: Diagnosis not present

## 2013-02-26 DIAGNOSIS — M5020 Other cervical disc displacement, unspecified cervical region: Secondary | ICD-10-CM | POA: Diagnosis not present

## 2013-02-26 DIAGNOSIS — Z79899 Other long term (current) drug therapy: Secondary | ICD-10-CM | POA: Diagnosis not present

## 2013-03-05 DIAGNOSIS — L658 Other specified nonscarring hair loss: Secondary | ICD-10-CM | POA: Diagnosis not present

## 2013-03-05 DIAGNOSIS — L089 Local infection of the skin and subcutaneous tissue, unspecified: Secondary | ICD-10-CM | POA: Diagnosis not present

## 2013-03-17 ENCOUNTER — Other Ambulatory Visit: Payer: Self-pay | Admitting: Medical

## 2013-05-11 ENCOUNTER — Encounter: Payer: Self-pay | Admitting: Family Medicine

## 2013-05-11 ENCOUNTER — Ambulatory Visit (INDEPENDENT_AMBULATORY_CARE_PROVIDER_SITE_OTHER): Payer: PRIVATE HEALTH INSURANCE | Admitting: Family Medicine

## 2013-05-11 ENCOUNTER — Other Ambulatory Visit: Payer: Self-pay | Admitting: Medical

## 2013-05-11 VITALS — BP 160/100 | HR 72 | Temp 98.0°F | Ht 66.0 in | Wt 182.0 lb

## 2013-05-11 DIAGNOSIS — R609 Edema, unspecified: Secondary | ICD-10-CM | POA: Diagnosis not present

## 2013-05-11 DIAGNOSIS — N10 Acute tubulo-interstitial nephritis: Secondary | ICD-10-CM

## 2013-05-11 DIAGNOSIS — R109 Unspecified abdominal pain: Secondary | ICD-10-CM

## 2013-05-11 LAB — POCT URINALYSIS DIPSTICK
Blood, UA: NEGATIVE
Ketones, UA: NEGATIVE
Protein, UA: NEGATIVE
Spec Grav, UA: 1.015
pH, UA: 5

## 2013-05-11 MED ORDER — CIPROFLOXACIN HCL 500 MG PO TABS: 500.0000 mg | ORAL_TABLET | Freq: Two times a day (BID) | ORAL | Status: DC

## 2013-05-11 MED ORDER — KETOROLAC TROMETHAMINE 60 MG/2ML IM SOLN
60.0000 mg | Freq: Once | INTRAMUSCULAR | Status: AC
  Administered 2013-05-11: 60 mg via INTRAMUSCULAR

## 2013-05-11 MED ORDER — POTASSIUM CHLORIDE CRYS ER 20 MEQ PO TBCR: EXTENDED_RELEASE_TABLET | ORAL | Status: DC

## 2013-05-11 MED ORDER — FUROSEMIDE 40 MG PO TABS: ORAL_TABLET | ORAL | Status: DC

## 2013-05-11 NOTE — Patient Instructions (Addendum)
Try and follow low sodium diet to help prevent swelling. Your swelling was minimal today, having been out of the diuretic. Try using the diuretic just as needed (along with the potassium on the days you take the furosemide), rather than taking it every day, if not needed.  Drink plenty of fluids. Do not take the nabumetone (relafen) for at least 6 hours after the shot you got today.

## 2013-05-11 NOTE — Progress Notes (Signed)
Chief Complaint  Patient presents with  . Flank Pain    right sided flank pain x 1 week, patient states she is in a lot of pain. Also for the last 3-4 days she feels like maybe she swallowed  the wrong way and pulled something in her neck.   Pain in her right flank started suddenly 7-8 days ago. She woke up with the pain.  Pain comes and goes throughout the day, has had pain daily.  Can't determine any triggers.  This feels different than her prior/chronic back pain.  Denies any urinary urgency, frequency or odor, doesn't feel like a bladder infection.  She has a history of kidney stone in the past (recalls it was on the left), but this feels different. This is described as more of an ache, rather than a sharp pain like with prior kidney stone.  She denies any vaginal discharge.  Last night she had a "cutting pain" in her right lower abdomen, woke her up from sleep, along with the pain in her back.  She is complaining of dry mouth, and thinks that she may have pulled something in her right neck x 3-4 days; she thinks this may have started after trying hard to swallow (due to feeling cottony/dry in her mouth).  Hurts to touch, doesn't hurt to move the neck/head.  Doesn't hurt to swallow.  She has been on lasix ever since chemo, which caused her a lot of swelling.  Labs reviewed--had normal chem panel (and CBC) in May, thru oncologist. She has run out of lasix and potassium and is requesting refills.  BP's have been high related to her back pain.  Currently in 8/10 pain.  She took am dose of oxycontin, has not taken any relafen today.  Past Medical History  Diagnosis Date  . Colon polyp   . Obesity   . Uterine fibroid     s/p embolization  . Lumbar degenerative disc disease   . History of breast cancer 2010    invasive ductal R breast; s/p lumpectomy, radiation and chemo  . Obesity   . Breast cancer, stage 1    Past Surgical History  Procedure Laterality Date  . Breast lumpectomy  2010   right  . Tubal ligation  2010  . Colonoscopy  2010  . Power port    . Lumbar laminectomy/decompression microdiscectomy  2007, 2008    Dr. Gerlene Fee x 2  . Uterine fibroid embolization  2008 or 2009   History   Social History  . Marital Status: Married    Spouse Name: N/A    Number of Children: N/A  . Years of Education: N/A   Occupational History  . Not on file.   Social History Main Topics  . Smoking status: Never Smoker   . Smokeless tobacco: Never Used  . Alcohol Use: No  . Drug Use: No  . Sexual Activity: Yes   Other Topics Concern  . Not on file   Social History Narrative  . No narrative on file   Current outpatient prescriptions:Calcium Carbonate-Vitamin D (CALCIUM + D) 600-200 MG-UNIT TABS, Take 2 tablets by mouth daily.  , Disp: , Rfl: ;  cyclobenzaprine (FLEXERIL) 10 MG tablet, Take 10 mg by mouth 3 (three) times daily as needed.  , Disp: , Rfl: ;  esomeprazole (NEXIUM) 40 MG capsule, Take 40 mg by mouth daily before breakfast., Disp: , Rfl: ;  gabapentin (NEURONTIN) 600 MG tablet, Take 600 mg by mouth at bedtime., Disp: ,  Rfl:  Multiple Vitamins-Minerals (MULTIVITAMIN WITH MINERALS) tablet, Take 1 tablet by mouth daily.  , Disp: , Rfl: ;  nabumetone (RELAFEN) 500 MG tablet, Take 500 mg by mouth 2 (two) times daily.  , Disp: , Rfl: ;  oxyCODONE (OXYCONTIN) 20 MG 12 hr tablet, Take 20 mg by mouth every 8 (eight) hours., Disp: , Rfl: ;  ciprofloxacin (CIPRO) 500 MG tablet, Take 1 tablet (500 mg total) by mouth 2 (two) times daily., Disp: 20 tablet, Rfl: 0 furosemide (LASIX) 40 MG tablet, TAKE 1 TABLET (40 MG TOTAL) BY MOUTH DAILY., Disp: 30 tablet, Rfl: 5;  NEXIUM 40 MG capsule, TAKE 1 CAPSULE BY MOUTH ONCE A DAY, Disp: 30 capsule, Rfl: 2;  potassium chloride SA (KLOR-CON M20) 20 MEQ tablet, TAKE 1 TABLET BY MOUTH TWICE DAILY, Disp: 60 tablet, Rfl: 5  Allergies  Allergen Reactions  . Adhesive [Tape] Hives  . Morphine Hives   ROS:  Denies fevers, but has had some chills.   Denies nausea, vomiting, diarrhea or other bowel changes (chronic constipation from pain meds). No urinary complaints, vaginal discharge. No weakness, falls. Denies chest pain.  Denies URI symptoms or sore throat (just sore muscle in neck).  See HPI  PHYSICAL EXAM: BP 160/100  Pulse 72  Temp(Src) 98 F (36.7 C) (Oral)  Ht 5\' 6"  (1.676 m)  Wt 182 lb (82.555 kg)  BMI 29.39 kg/m2 Pleasant female, no significant distress HEENT:  PERRL, EOMI, conjunctiva and sclera are clear.  TM's and EAC's normal.  OP clear without erythema or lesions.  Sinuses nontender.   Neck: no lymphadenopathy, thyromegaly or mass.  Mild tenderness along R SCM in anterior neck Abdomen: soft, no mass. Mild tenderness in RUQ, no rebound or guarding. Negative Murphy sign Extremities:  No pitting edema. 2+ pulses Back: +R CVA tenderness Neuro: alert and oriented.  Normal gait, strength; cranial nerves intact  Urine dip:  2+ leuks, no blood   ASSESSMENT/PLAN:  Flank pain - right.  suspect infection based on urine dip, rather than stone, but she has h/o stones.  treat with cipro; stay well hydrated - Plan: POCT Urinalysis Dipstick, ketorolac (TORADOL) injection 60 mg  Edema - Plan: furosemide (LASIX) 40 MG tablet, potassium chloride SA (KLOR-CON M20) 20 MEQ tablet  Acute pyelonephritis - Plan: ciprofloxacin (CIPRO) 500 MG tablet, Urine culture, ketorolac (TORADOL) injection 60 mg  Acute onset of right flank pain, with some R sided abdominal pain as well.  Urine suggests infection, rather than stone. Treat for pyelo.  Drink plenty of fluids.  Hold off on restarting lasix for now, since not having any swelling.   If not improving in 2-3 days, may need imaging. Send urine for culture.  Treated with toradol today, may resume home NSAIDS after 6 hours. Monitor BP's elsewhere--likely higher than normal today due to pain.  Edema related to chemo--no evidence of swelling today, and has been out of lasix for about a week.  Likely can just use prn (along with the potassium), to minimize side effects.  Low sodium diet encouraged.

## 2013-05-12 ENCOUNTER — Encounter: Payer: Self-pay | Admitting: Family Medicine

## 2013-05-14 ENCOUNTER — Telehealth: Payer: Self-pay | Admitting: Internal Medicine

## 2013-05-14 MED ORDER — NITROFURANTOIN MONOHYD MACRO 100 MG PO CAPS: 100.0000 mg | ORAL_CAPSULE | Freq: Two times a day (BID) | ORAL | Status: DC

## 2013-05-14 NOTE — Telephone Encounter (Signed)
macrobid 100mg  sent to [facility] for pt

## 2013-05-30 DIAGNOSIS — I513 Intracardiac thrombosis, not elsewhere classified: Secondary | ICD-10-CM

## 2013-05-30 DIAGNOSIS — Z8679 Personal history of other diseases of the circulatory system: Secondary | ICD-10-CM

## 2013-05-30 HISTORY — DX: Intracardiac thrombosis, not elsewhere classified: I51.3

## 2013-05-30 HISTORY — DX: Personal history of other diseases of the circulatory system: Z86.79

## 2013-06-04 ENCOUNTER — Encounter: Payer: Self-pay | Admitting: Adult Health

## 2013-06-04 ENCOUNTER — Ambulatory Visit (HOSPITAL_BASED_OUTPATIENT_CLINIC_OR_DEPARTMENT_OTHER): Payer: PRIVATE HEALTH INSURANCE | Admitting: Adult Health

## 2013-06-04 ENCOUNTER — Telehealth: Payer: Self-pay | Admitting: Oncology

## 2013-06-04 ENCOUNTER — Other Ambulatory Visit (HOSPITAL_BASED_OUTPATIENT_CLINIC_OR_DEPARTMENT_OTHER): Payer: PRIVATE HEALTH INSURANCE | Admitting: Lab

## 2013-06-04 ENCOUNTER — Encounter: Payer: Self-pay | Admitting: Gastroenterology

## 2013-06-04 VITALS — BP 153/95 | HR 98 | Temp 98.5°F | Resp 20 | Ht 66.0 in | Wt 184.9 lb

## 2013-06-04 DIAGNOSIS — C50419 Malignant neoplasm of upper-outer quadrant of unspecified female breast: Secondary | ICD-10-CM

## 2013-06-04 DIAGNOSIS — R109 Unspecified abdominal pain: Secondary | ICD-10-CM

## 2013-06-04 DIAGNOSIS — Z171 Estrogen receptor negative status [ER-]: Secondary | ICD-10-CM | POA: Diagnosis not present

## 2013-06-04 DIAGNOSIS — C50911 Malignant neoplasm of unspecified site of right female breast: Secondary | ICD-10-CM

## 2013-06-04 DIAGNOSIS — R140 Abdominal distension (gaseous): Secondary | ICD-10-CM

## 2013-06-04 DIAGNOSIS — C50919 Malignant neoplasm of unspecified site of unspecified female breast: Secondary | ICD-10-CM | POA: Diagnosis not present

## 2013-06-04 LAB — COMPREHENSIVE METABOLIC PANEL (CC13)
ALT: 13 U/L (ref 0–55)
Albumin: 3.7 g/dL (ref 3.5–5.0)
Anion Gap: 11 mEq/L (ref 3–11)
BUN: 7.8 mg/dL (ref 7.0–26.0)
CO2: 28 mEq/L (ref 22–29)
Calcium: 9.7 mg/dL (ref 8.4–10.4)
Chloride: 106 mEq/L (ref 98–109)
Creatinine: 0.8 mg/dL (ref 0.6–1.1)
Potassium: 3.2 mEq/L — ABNORMAL LOW (ref 3.5–5.1)

## 2013-06-04 LAB — CBC WITH DIFFERENTIAL/PLATELET
Eosinophils Absolute: 0.1 10*3/uL (ref 0.0–0.5)
HCT: 40.2 % (ref 34.8–46.6)
LYMPH%: 44.5 % (ref 14.0–49.7)
MONO#: 0.3 10*3/uL (ref 0.1–0.9)
NEUT#: 2.7 10*3/uL (ref 1.5–6.5)
NEUT%: 48.1 % (ref 38.4–76.8)
Platelets: 249 10*3/uL (ref 145–400)
WBC: 5.7 10*3/uL (ref 3.9–10.3)
lymph#: 2.5 10*3/uL (ref 0.9–3.3)

## 2013-06-04 NOTE — Patient Instructions (Signed)
Doing well.  No sign of recurrence.  Please call us if you have any questions or concerns.

## 2013-06-04 NOTE — Telephone Encounter (Signed)
, °

## 2013-06-04 NOTE — Progress Notes (Addendum)
OFFICE PROGRESS NOTE  CC  KNAPP,EVE A, MD [address] [place] [address] Dr. Maisie Fus [address] Dr. 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  PRIOR THERAPY:  #1 patient is status post lumpectomy every 2004 80 ER negative PR negative HER-2/neu negative stage I invasive ductal carcinoma.  #2 patient then received adjuvant chemotherapy consisting of Adriamycin Cytoxan given dose dense x4 cycles followed by Taxotere which she completed in September 2010.  #3 patient then went on to receive adjuvant radiation therapy completing in November 2010.  CURRENT THERAPY:Observation.  INTERVAL HISTORY: Olivia [name] 47 y.o. female returns for Followup visit today. She is doing well.  She denies fevers, chills, unintentional weight loss, or new pain.  She does have night sweats and is peri-menopausal.  Her LMP was 10/16/2008.    MEDICAL HISTORY: Past Medical History  Diagnosis Date  . Colon polyp   . Obesity   . Uterine fibroid     s/p embolization  . Lumbar degenerative disc disease   . History of breast cancer 2010    invasive ductal R breast; s/p lumpectomy, radiation and chemo  . Obesity   . Breast cancer, stage 1     ALLERGIES:  is allergic to adhesive and morphine.  MEDICATIONS:  Current Outpatient Prescriptions  Medication Sig Dispense Refill  . Calcium Carbonate-Vitamin D (CALCIUM + D) 600-200 MG-UNIT TABS Take 2 tablets by mouth daily.        . cyclobenzaprine (FLEXERIL) 10 MG tablet Take 10 mg by mouth 3 (three) times daily as needed.        [name] esomeprazole (NEXIUM) 40 MG capsule Take 40 mg by mouth daily before breakfast.      . furosemide (LASIX) 40 MG tablet TAKE 1 TABLET (40 MG TOTAL) BY MOUTH DAILY.  30 tablet  5  . gabapentin (NEURONTIN) 600 MG tablet Take 600 mg by mouth at bedtime.      . Multiple Vitamins-Minerals (MULTIVITAMIN WITH MINERALS) tablet  Take 1 tablet by mouth daily.        . nabumetone (RELAFEN) 500 MG tablet Take 500 mg by mouth 2 (two) times daily.        [name] oxyCODONE (OXYCONTIN) 20 MG 12 hr tablet Take 20 mg by mouth every 8 (eight) hours.      . potassium chloride SA (KLOR-CON M20) 20 MEQ tablet TAKE 1 TABLET BY MOUTH TWICE DAILY  60 tablet  5   No current facility-administered medications for this visit.    SURGICAL HISTORY:  Past Surgical History  Procedure Laterality Date  . Breast lumpectomy  2010    right  . Tubal ligation  2010  . Colonoscopy  2010  . Power port    . Lumbar laminectomy/decompression microdiscectomy  2007, 2008    Dr. Gerlene Fee x 2  . Uterine fibroid embolization  2008 or 2009    REVIEW OF SYSTEMS:  A 10 point review of systems was conducted and is otherwise negative except for what is noted above.    Health Maintenance Mammogram: 2/14 Colonoscopy: 06/2009 Bone Density Scan: 10/2011 Pap Smear: 11/2011 Eye Exam: 01/2012 Vitamin D Level: previously normal Lipid Panel: unknown   PHYSICAL EXAMINATION:  BP 153/95  Pulse 98  Temp(Src) 98.5 F (36.9 C) (Oral)  Resp 20  Ht 5\' 6"  (1.676 m)  Wt 184 lb 14.4 oz (83.87 kg)  BMI 29.86 kg/m2 General: Patient is a well appearing  female in no acute distress HEENT: PERRLA, sclerae anicteric no conjunctival pallor, MMM Neck: supple, no palpable adenopathy Lungs: clear to auscultation bilaterally, no wheezes, rhonchi, or rales Cardiovascular: regular rate rhythm, S1, S2, no murmurs, rubs or gallops Abdomen: Soft, non-tender, non-distended, normoactive bowel sounds, no HSM Extremities: warm and well perfused, no clubbing, cyanosis, or edema Skin: No rashes or lesions Neuro: Non-focal Breast examination left breast does reveal a well-healed surgical scar there is tenderness in the rib cage anteriorly. Right breast reveals well-healed surgical scar no masses are palpated no nipple discharge. ECOG PERFORMANCE STATUS: 1 - Symptomatic but completely  ambulatory    LABORATORY DATA: Lab Results  Component Value Date   WBC 5.7 06/04/2013   HGB 13.2 06/04/2013   HCT 40.2 06/04/2013   MCV 91.1 06/04/2013   PLT 249 06/04/2013      RADIOGRAPHIC STUDIES:  No results found.  ASSESSMENT: 47 year old female with  #1 stage I ER/PR negative HER-2/neu negative node negative invasive ductal carcinoma of the right breast status post lumpectomy and every 2010 followed by adjuvant chemotherapy which she completed in September 2010 and followed by radiation completing in November 2010. She is without any clinical evidence of recurrent disease.  #2 patient's mammograms are up-to-date.  #3 Abd/pain, distention, chest wall abnormality  PLAN:   #1 Doing well.  No sign of recurrence.  We discussed survivorship, diet, exercise, and keeping up with her mammograms.    #2 We will see her back in 6 months for follow up.    All questions were answered. The patient knows to call the clinic with any problems, questions or concerns. We can certainly see the patient much sooner if necessary.  I spent 25 minutes counseling the patient face to face. The total time spent in the appointment was 30 minutes.  Illa Level, NP Medical Oncology [facility] [phone] 06/05/2013, 1:19 PM     ATTENDING'S ATTESTATION:  I personally reviewed patient's chart, examined patient myself, formulated the treatment plan as followed.    Overall patient is doing well she has minimal residual side effects from chemotherapy. She does have ongoing chronic pain. Mrs. not do to chemotherapy... musculoskeletal aches and pains that she's had in the past prior to her diagnosis of breast cancer. Clinically patient has no evidence of recurrent breast cancer. She looks remarkably well. She'll continue to be seen every 6 months in followup  Drue Second, MD Medical/Oncology [facility] [phone] (beeper) [phone] (Office)  06/05/2013,  5:18 PM

## 2013-06-05 LAB — VITAMIN D 25 HYDROXY (VIT D DEFICIENCY, FRACTURES): Vit D, 25-Hydroxy: 54 ng/mL (ref 30–89)

## 2013-06-08 ENCOUNTER — Other Ambulatory Visit: Payer: Self-pay | Admitting: *Deleted

## 2013-06-08 NOTE — Telephone Encounter (Signed)
Per NP, called pt LMOVM  to notify to take 2 tablets -Kdur daily x 7days. Pt should have refills on her current prescription, if not to call us.  Request pt call back to confirm message received.

## 2013-06-09 ENCOUNTER — Ambulatory Visit (INDEPENDENT_AMBULATORY_CARE_PROVIDER_SITE_OTHER): Payer: PRIVATE HEALTH INSURANCE | Admitting: Gastroenterology

## 2013-06-09 ENCOUNTER — Encounter: Payer: Self-pay | Admitting: Gastroenterology

## 2013-06-09 ENCOUNTER — Other Ambulatory Visit (INDEPENDENT_AMBULATORY_CARE_PROVIDER_SITE_OTHER): Payer: PRIVATE HEALTH INSURANCE

## 2013-06-09 VITALS — BP 142/96 | HR 104 | Ht 66.0 in | Wt 183.4 lb

## 2013-06-09 DIAGNOSIS — K5900 Constipation, unspecified: Secondary | ICD-10-CM | POA: Diagnosis not present

## 2013-06-09 DIAGNOSIS — R140 Abdominal distension (gaseous): Secondary | ICD-10-CM

## 2013-06-09 DIAGNOSIS — K6389 Other specified diseases of intestine: Secondary | ICD-10-CM | POA: Diagnosis not present

## 2013-06-09 DIAGNOSIS — K219 Gastro-esophageal reflux disease without esophagitis: Secondary | ICD-10-CM

## 2013-06-09 DIAGNOSIS — T4275XA Adverse effect of unspecified antiepileptic and sedative-hypnotic drugs, initial encounter: Secondary | ICD-10-CM

## 2013-06-09 DIAGNOSIS — K625 Hemorrhage of anus and rectum: Secondary | ICD-10-CM

## 2013-06-09 DIAGNOSIS — Z86010 Personal history of colonic polyps: Secondary | ICD-10-CM | POA: Diagnosis not present

## 2013-06-09 DIAGNOSIS — R141 Gas pain: Secondary | ICD-10-CM

## 2013-06-09 LAB — T3, FREE: T3, Free: 2.9 pg/mL (ref 2.3–4.2)

## 2013-06-09 MED ORDER — NA SULFATE-K SULFATE-MG SULF 17.5-3.13-1.6 GM/177ML PO SOLN: ORAL | Status: DC

## 2013-06-09 NOTE — Progress Notes (Signed)
History of Present Illness:  This is a somewhat complicated 47 year old African American female who has abdominal gas, bloating, belching, burping, chronic acid reflux, and chronic constipation probably related to oxycodone use which he takes because of chronic low back pain. She is status post lumpectomy and chemotherapy for breast cancer in 2010. At that time she had colonoscopy with Dr. Loreta Ave apparently had a polyp removed. She continues with almost no bowel movements without MiraLax use, gas, bloating, belching, burping, and occasional bright red blood with a hard stool. She has a very dry mouth and uses a large amount of diet gum which contains a lot of sorbitol. She denies lactose intolerance, anorexia, weight loss, dysphagia, or any history of hepatitis or pancreatitis, known gallbladder or liver disease. She is on Nexium 40 mg a day for acid reflux, and continues with some symptoms of regurgitation. She denies fever, chills, skin rashes, joint pains, or other systemic complaints. Review of her recent labs shows no specific abnormalities, but I do not see thyroid functions.  I have reviewed this patient's present history, medical and surgical past history, allergies and medications.     ROS:   All systems were reviewed and are negative unless otherwise stated in the HPI.    Physical Exam: Blood pressure 142/96, pulse 104 and weight 183 pounds with a BMI of 29.62. General well developed well nourished patient in no acute distress, appearing their stated age Eyes PERRLA, no icterus, fundoscopic exam per opthamologist Skin no lesions noted Neck supple, no adenopathy, no thyroid enlargement, no tenderness Chest clear to percussion and auscultation Heart no significant murmurs, gallops or rubs noted Abdomen no hepatosplenomegaly masses or tenderness, BS normal. There is no abdominal distention, organomegaly, masses, tenderness, or succussion splash. Rectal exam is deferred.  Extremities no acute  joint lesions, edema, phlebitis or evidence of cellulitis.... very diminished reflexes noted. Neurologic patient oriented x 3, cranial nerves intact, no focal neurologic deficits noted. Psychological mental status normal and normal affect.  Assessment and plan: Chronic constipation with gas and bloating probably related to oxycodone use in a patient with constipation predominant IBS, rule out thyroid dysfunction. Some of her gas and bloating also is related to large intake of nonabsorbable carbohydrates with chewing gum, and I have asked her to avoid these products in her diet. Because of a history of colon polyps and intermittent hematochezia, I've scheduled followup colonoscopy exam. She has chronic GERD in his PPI-dependent, and the time of colonoscopy were also do endoscopy. For now she will continue her PPI, and I have added Linzess 290 mcg/day to her regime with when necessary MiraLax use. She does not appear to have lactose intolerance. On exam there is no evidence of gastroparesis. Patient has tried probiotics in the past without symptomatic improvement. We'll request previous colonoscopy report from Dr. Loreta Ave. She is on narcotics for chronic back pain. The patient has had gynecologic evaluation within the last year that was unremarkable, gives no history of ovarian problems. She no longer menstruates since her chemotherapy. She is followed regularly by oncology.   Please copy her primary care physician, referring physician, and pertinent subspecialists.

## 2013-06-09 NOTE — Patient Instructions (Addendum)
You have been scheduled for an endoscopy and colonoscopy with propofol. Please follow the written instructions given to you at your visit today. Please pick up your prep at the pharmacy within the next 1-3 days. If you use inhalers (even only as needed), please bring them with you on the day of your procedure. Your physician has requested that you go to www.startemmi.com and enter the access code given to you at your visit today. This web site gives a general overview about your procedure. However, you should still follow specific instructions given to you by our office regarding your preparation for the procedure.  Your physician has requested that you go to the basement for the following lab work before leaving today: TSH T4 T3 Free  Information on Artificial Sweeteners given today for your review  We have given you samples of the following medication to take: Linzess, 290 mcg, please take one tablet by mouth once daily  If this works well for you please call back for a prescription  ________________________________________________________________________________________________

## 2013-06-10 ENCOUNTER — Encounter (HOSPITAL_COMMUNITY): Payer: Self-pay | Admitting: Emergency Medicine

## 2013-06-10 ENCOUNTER — Emergency Department (INDEPENDENT_AMBULATORY_CARE_PROVIDER_SITE_OTHER)
Admission: EM | Admit: 2013-06-10 | Discharge: 2013-06-10 | Disposition: A | Discharge status: Nursing Home (Includes ICF) | Payer: PRIVATE HEALTH INSURANCE | Source: Home / Self Care | Attending: Emergency Medicine | Admitting: Emergency Medicine

## 2013-06-10 ENCOUNTER — Emergency Department (INDEPENDENT_AMBULATORY_CARE_PROVIDER_SITE_OTHER): Payer: PRIVATE HEALTH INSURANCE

## 2013-06-10 ENCOUNTER — Observation Stay (HOSPITAL_COMMUNITY)
Admission: EM | Admit: 2013-06-10 | Discharge: 2013-06-11 | Disposition: A | Discharge status: Home / Self Care | Payer: PRIVATE HEALTH INSURANCE | Attending: Family Medicine | Admitting: Family Medicine

## 2013-06-10 DIAGNOSIS — Z853 Personal history of malignant neoplasm of breast: Secondary | ICD-10-CM | POA: Insufficient documentation

## 2013-06-10 DIAGNOSIS — M545 Low back pain, unspecified: Secondary | ICD-10-CM | POA: Insufficient documentation

## 2013-06-10 DIAGNOSIS — F411 Generalized anxiety disorder: Secondary | ICD-10-CM | POA: Insufficient documentation

## 2013-06-10 DIAGNOSIS — D259 Leiomyoma of uterus, unspecified: Secondary | ICD-10-CM | POA: Insufficient documentation

## 2013-06-10 DIAGNOSIS — E7800 Pure hypercholesterolemia, unspecified: Secondary | ICD-10-CM

## 2013-06-10 DIAGNOSIS — Z885 Allergy status to narcotic agent status: Secondary | ICD-10-CM | POA: Insufficient documentation

## 2013-06-10 DIAGNOSIS — I10 Essential (primary) hypertension: Secondary | ICD-10-CM | POA: Insufficient documentation

## 2013-06-10 DIAGNOSIS — R079 Chest pain, unspecified: Secondary | ICD-10-CM

## 2013-06-10 DIAGNOSIS — K219 Gastro-esophageal reflux disease without esophagitis: Secondary | ICD-10-CM | POA: Insufficient documentation

## 2013-06-10 DIAGNOSIS — D126 Benign neoplasm of colon, unspecified: Secondary | ICD-10-CM | POA: Insufficient documentation

## 2013-06-10 DIAGNOSIS — R0602 Shortness of breath: Secondary | ICD-10-CM | POA: Diagnosis not present

## 2013-06-10 DIAGNOSIS — C50919 Malignant neoplasm of unspecified site of unspecified female breast: Secondary | ICD-10-CM

## 2013-06-10 DIAGNOSIS — G8929 Other chronic pain: Secondary | ICD-10-CM | POA: Insufficient documentation

## 2013-06-10 DIAGNOSIS — R5381 Other malaise: Secondary | ICD-10-CM

## 2013-06-10 DIAGNOSIS — M51379 Other intervertebral disc degeneration, lumbosacral region without mention of lumbar back pain or lower extremity pain: Secondary | ICD-10-CM | POA: Insufficient documentation

## 2013-06-10 DIAGNOSIS — M5137 Other intervertebral disc degeneration, lumbosacral region: Secondary | ICD-10-CM | POA: Insufficient documentation

## 2013-06-10 DIAGNOSIS — E669 Obesity, unspecified: Secondary | ICD-10-CM | POA: Insufficient documentation

## 2013-06-10 LAB — POCT I-STAT, CHEM 8
Chloride: 103 mEq/L (ref 96–112)
Creatinine, Ser: 1 mg/dL (ref 0.50–1.10)
Glucose, Bld: 106 mg/dL — ABNORMAL HIGH (ref 70–99)
Hemoglobin: 14.6 g/dL (ref 12.0–15.0)
Potassium: 3.4 mEq/L — ABNORMAL LOW (ref 3.5–5.1)
Sodium: 141 mEq/L (ref 135–145)

## 2013-06-10 LAB — POCT URINALYSIS DIP (DEVICE)
Bilirubin Urine: NEGATIVE
Glucose, UA: NEGATIVE mg/dL
Hgb urine dipstick: NEGATIVE
Ketones, ur: NEGATIVE mg/dL
Nitrite: NEGATIVE
Specific Gravity, Urine: 1.01 (ref 1.005–1.030)
pH: 6 (ref 5.0–8.0)

## 2013-06-10 LAB — CBC WITH DIFFERENTIAL/PLATELET
Basophils Absolute: 0 10*3/uL (ref 0.0–0.1)
Basophils Relative: 0 % (ref 0–1)
Eosinophils Absolute: 0.1 10*3/uL (ref 0.0–0.7)
Eosinophils Relative: 1 % (ref 0–5)
HCT: 39.8 % (ref 36.0–46.0)
Hemoglobin: 13.9 g/dL (ref 12.0–15.0)
MCH: 31.2 pg (ref 26.0–34.0)
MCHC: 34.9 g/dL (ref 30.0–36.0)
Monocytes Absolute: 0.3 10*3/uL (ref 0.1–1.0)
Monocytes Relative: 5 % (ref 3–12)
Neutro Abs: 3.6 10*3/uL (ref 1.7–7.7)
RDW: 12 % (ref 11.5–15.5)

## 2013-06-10 MED ORDER — SODIUM CHLORIDE 0.9 % IV SOLN
INTRAVENOUS | Status: DC
  Administered 2013-06-11 (×2): via INTRAVENOUS

## 2013-06-10 MED ORDER — SODIUM CHLORIDE 0.9 % IV SOLN
INTRAVENOUS | Status: DC
  Administered 2013-06-10: 17:00:00 via INTRAVENOUS

## 2013-06-10 NOTE — ED Provider Notes (Signed)
Chief Complaint:   Chief Complaint  Patient presents with  . Weakness    History of Present Illness:   Olivia [name] is a 47 year old female who experienced onset of today a profound and generalized weakness, she felt she was about to pass out, and shortness of breath, dizziness, and lightheadedness. She felt nauseated but did not vomit. She felt cold and chilled. She has ongoing pain in her back with radiation to her right thigh and right leg with numbness and tingling. This is a chronic problem is no better or worse than usual. She's had some right, lateral chest pain which hurts when she lies down or palpates the area. She has some pain with deep inspiration. She also has chronic abdominal bloating, gas, and belching. This is been going on for months. She saw Dr. Jarold Motto for this yesterday. A week ago she was seen by her oncologist for a routine cancer followup. She was found to have a low potassium at 3.2. She was begun on a higher dose of potassium chloride. The patient has a history of breast cancer since 2010. She underwent a lumpectomy, chemotherapy, and radiation therapy. She's been followed at the oncology center since then has had no evidence of recurrence. She denies any fever, headache, nasal congestion, sore throat, cough, palpitations, abdominal pain, vomiting, diarrhea, melena, hematemesis, or urinary symptoms. She has a history of urinary tract infection several months ago.  Review of Systems:  Other than noted above, the patient denies any of the following symptoms. Systemic:  No fever, chills, sweats, myalgias, headache, or anorexia. Eye:  No redness, pain or drainage. ENT:  No earache, nasal congestion, rhinorrhea, sinus pressure, or sore throat. Lungs:  No cough, sputum production, wheezing, shortness of breath. No loud snoring, choking or gasping at night, or unrefreshing sleep. Cardiovascular:  No chest pain, palpitations, or syncope. GI:  No nausea, vomiting, abdominal pain  or diarrhea. GU:  No dysuria, frequency, or hematuria. Skin:  No rash or pruritis. Psych:  No history of depression or anxiety.  PMFSH:  Past medical history, family history, social history, meds, and allergies were reviewed. No past history of sleep apnea, depression or anxiety.  She's allergic to morphine and tape. Current meds include OxyContin, gabapentin, Relafen, and Nexium. She has a history of gastroesophageal reflux.  Physical Exam:   Vital signs:  BP 159/107  Pulse 168  Temp(Src) 99.1 F (37.3 C) (Oral)  Resp 19  SpO2 100% Filed Vitals:   06/10/13 1533 06/10/13 1539 Supine  06/10/13 1540 Sitting  06/10/13 1543 Standing   BP: 162/108 143/97 154/111 159/107  Pulse: 103 95 101 168  Temp: 99.1 F (37.3 C)     TempSrc: Oral     Resp: 19     SpO2: 100%      General:  Alert, in no distress. Eye:  PERRL, full EOMs.  Lids and conjunctivas were normal. ENT:  TMs and canals were normal, without erythema or inflammation.  Nasal mucosa was clear and uncongested, without drainage.  Mucous membranes were moist.  Pharynx was clear, without exudate or drainage.  There were no oral ulcerations or lesions. Neck:  Supple, no adenopathy, tenderness or mass. Thyroid was normal. Lungs:  No respiratory distress.  Lungs were clear to auscultation, without wheezes, rales or rhonchi.  Breath sounds were clear and equal bilaterally. Heart:  Regular rhythm, without gallops, murmers or rubs. Abdomen:  Soft, flat, and non-tender to palpation.  No hepatosplenomagaly or mass. Skin:  Clear, warm, and  dry, without rash or lesions. Psych:  Normal mood and affect.  Labs:   Results for orders placed during the hospital encounter of 06/10/13  CBC WITH DIFFERENTIAL      Result Value Range   WBC 6.3  4.0 - 10.5 K/uL   RBC 4.46  3.87 - 5.11 MIL/uL   Hemoglobin 13.9  12.0 - 15.0 g/dL   HCT 40.9  81.1 - 91.4 %   MCV 89.2  78.0 - 100.0 fL   MCH 31.2  26.0 - 34.0 pg   MCHC 34.9  30.0 - 36.0 g/dL   RDW  78.2  95.6 - 21.3 %   Platelets 259  150 - 400 K/uL   Neutrophils Relative % 58  43 - 77 %   Neutro Abs 3.6  1.7 - 7.7 K/uL   Lymphocytes Relative 36  12 - 46 %   Lymphs Abs 2.3  0.7 - 4.0 K/uL   Monocytes Relative 5  3 - 12 %   Monocytes Absolute 0.3  0.1 - 1.0 K/uL   Eosinophils Relative 1  0 - 5 %   Eosinophils Absolute 0.1  0.0 - 0.7 K/uL   Basophils Relative 0  0 - 1 %   Basophils Absolute 0.0  0.0 - 0.1 K/uL  POCT URINALYSIS DIP (DEVICE)      Result Value Range   Glucose, UA NEGATIVE  NEGATIVE mg/dL   Bilirubin Urine NEGATIVE  NEGATIVE   Ketones, ur NEGATIVE  NEGATIVE mg/dL   Specific Gravity, Urine 1.010  1.005 - 1.030   Hgb urine dipstick NEGATIVE  NEGATIVE   pH 6.0  5.0 - 8.0   Protein, ur NEGATIVE  NEGATIVE mg/dL   Urobilinogen, UA 0.2  0.0 - 1.0 mg/dL   Nitrite NEGATIVE  NEGATIVE   Leukocytes, UA NEGATIVE  NEGATIVE  POCT PREGNANCY, URINE      Result Value Range   Preg Test, Ur NEGATIVE  NEGATIVE  POCT I-STAT, CHEM 8      Result Value Range   Sodium 141  135 - 145 mEq/L   Potassium 3.4 (*) 3.5 - 5.1 mEq/L   Chloride 103  96 - 112 mEq/L   BUN 6  6 - 23 mg/dL   Creatinine, Ser 0.86  0.50 - 1.10 mg/dL   Glucose, Bld 578 (*) 70 - 99 mg/dL   Calcium, Ion 4.69  6.29 - 1.23 mmol/L   TCO2 26  0 - 100 mmol/L   Hemoglobin 14.6  12.0 - 15.0 g/dL   HCT 52.8  41.3 - 24.4 %     Radiology:  Dg Chest 2 View  06/10/2013   CLINICAL DATA:  Chest pain  EXAM: CHEST  2 VIEW  COMPARISON:  06/05/2012  FINDINGS: Cardiac shadow is within normal limits. The lungs are well aerated bilaterally. No focal infiltrate or sizable parenchymal nodule is noted. No acute bony abnormality is seen.  IMPRESSION: No acute abnormality noted.   Electronically Signed   By: Alcide Clever M.D.   On: 06/10/2013 17:01   EKG Results:  Date: 06/10/2013  Rate: 90  Rhythm: normal sinus rhythm  QRS Axis: normal  Intervals: normal  ST/T Wave abnormalities: normal  Conduction Disutrbances:none  Narrative  Interpretation: Normal sinus rhythm, minimal voltage criteria for LVH, maybe normal variant.  Old EKG Reviewed: none available  Course in Urgent Care Center:   She was started on IV normal saline, she received about 300 mL of this, and did not feel any better. When she  was sent for chest x-ray, the IV became nonfunctional and was only running at a very slow rate. Therefore was removed. Because of continued symptoms, she was sent to the emergency department for further evaluation.  Assessment:  The primary encounter diagnosis was Malaise and fatigue. A diagnosis of Chest pain was also pertinent to this visit.  Differential diagnosis includes hypovolemia, possibly secondary to furosemide use, hypokalemia, or pulmonary embolism. Her past history of breast cancer puts her in a high-risk category for pulmonary embolism.  Plan:  The patient was transferred to the ED via shuttle in stable condition.  Medical Decision Making:  47 year old female with history of breast cancer who presents with 1 day history of extreme, generalized weakness, presyncope, nausea, chills, and pleuritic right lateral chest pain.  She was significantly orthostatic on vital signs.  Her lab workup includes normal CXR, EKG, CBC, and UA.  Her i Stat 8 shows a K of 3.4.  One week ago she had a K of 3.2 and she was placed on supplemental KCL. She was given IV normal saline here, but feels no better.  My main concerns are dehydration or pulmonary embolism.  I think she needs a CT angiogram of chest and IV rehydration.       Reuben Likes, MD 06/10/13 2150

## 2013-06-10 NOTE — ED Notes (Signed)
Pt was seen at urgent care at 1300, pt possible have PE, pain in right abdomen, radiates in to right leg 9/10

## 2013-06-10 NOTE — ED Notes (Signed)
Pt is allergic to adhesive tape; removed tape and anchored IV w/paper tape.

## 2013-06-10 NOTE — ED Notes (Signed)
The pt has had abd upper outer pain since Monday with nausea.  The pain is more rt sided and radaited around.  She was sent here from [facility] for treatment.  They placed an iv lt a-c. .  Blood work and urine was done there with a chest xray.  She is having leg pain last night.  She has chronic back pain she is also c/o weakness.  lmp  none

## 2013-06-10 NOTE — ED Notes (Signed)
Pt c/o weakness onset this pm Sxs also include: nauseas, chills, SOB Had pain no right side/rib cage Denies: inj/trauma, f/v/d, urinary sxs, abd pain, cold sxs Alert w/no signs of acute distress.

## 2013-06-10 NOTE — ED Notes (Signed)
Pt inquiring about delay, and wanting to leave at this time. Delay and plan explained to patient at this time. Pt says she is just dehydrated and needs fluids. Pt alert, ambulatory without difficulty and NAD. Pt sitting with spouse in lobby, will update and continue to monitor

## 2013-06-10 NOTE — ED Notes (Signed)
Pt sent down by shuttle w/IV lock... Report given to first nurse, Desma Paganini???

## 2013-06-11 ENCOUNTER — Emergency Department (HOSPITAL_COMMUNITY): Payer: PRIVATE HEALTH INSURANCE

## 2013-06-11 ENCOUNTER — Encounter (HOSPITAL_COMMUNITY): Payer: Self-pay | Admitting: Radiology

## 2013-06-11 ENCOUNTER — Telehealth: Payer: Self-pay | Admitting: Gastroenterology

## 2013-06-11 DIAGNOSIS — R079 Chest pain, unspecified: Secondary | ICD-10-CM | POA: Diagnosis not present

## 2013-06-11 DIAGNOSIS — R0602 Shortness of breath: Secondary | ICD-10-CM | POA: Diagnosis not present

## 2013-06-11 DIAGNOSIS — E7800 Pure hypercholesterolemia, unspecified: Secondary | ICD-10-CM

## 2013-06-11 DIAGNOSIS — F411 Generalized anxiety disorder: Secondary | ICD-10-CM

## 2013-06-11 DIAGNOSIS — C50919 Malignant neoplasm of unspecified site of unspecified female breast: Secondary | ICD-10-CM

## 2013-06-11 DIAGNOSIS — K219 Gastro-esophageal reflux disease without esophagitis: Secondary | ICD-10-CM

## 2013-06-11 DIAGNOSIS — I517 Cardiomegaly: Secondary | ICD-10-CM

## 2013-06-11 LAB — COMPREHENSIVE METABOLIC PANEL
ALT: 16 U/L (ref 0–35)
BUN: 5 mg/dL — ABNORMAL LOW (ref 6–23)
Calcium: 9.1 mg/dL (ref 8.4–10.5)
Chloride: 109 mEq/L (ref 96–112)
Creatinine, Ser: 0.74 mg/dL (ref 0.50–1.10)
GFR calc Af Amer: >90 mL/min (ref >90)
Glucose, Bld: 94 mg/dL (ref 70–99)
Sodium: 141 mEq/L (ref 135–145)
Total Bilirubin: 0.3 mg/dL (ref 0.3–1.2)
Total Protein: 7.4 g/dL (ref 6.0–8.3)

## 2013-06-11 LAB — CBC WITH DIFFERENTIAL/PLATELET
Basophils Relative: 0 % (ref 0–1)
HCT: 40.1 % (ref 36.0–46.0)
Hemoglobin: 13.9 g/dL (ref 12.0–15.0)
Lymphocytes Relative: 48 % — ABNORMAL HIGH (ref 12–46)
Lymphs Abs: 2.4 10*3/uL (ref 0.7–4.0)
MCHC: 34.7 g/dL (ref 30.0–36.0)
Monocytes Absolute: 0.3 10*3/uL (ref 0.1–1.0)
Monocytes Relative: 6 % (ref 3–12)
Neutro Abs: 2.3 10*3/uL (ref 1.7–7.7)
Neutrophils Relative %: 44 % (ref 43–77)
RBC: 4.43 MIL/uL (ref 3.87–5.11)
RDW: 12.3 % (ref 11.5–15.5)
WBC: 5.1 10*3/uL (ref 4.0–10.5)

## 2013-06-11 LAB — TROPONIN I: Troponin I: <0.3 ng/mL (ref <0.30)

## 2013-06-11 LAB — POCT I-STAT TROPONIN I: Troponin i, poc: 0.01 ng/mL (ref 0.00–0.08)

## 2013-06-11 MED ORDER — NABUMETONE 500 MG PO TABS
500.0000 mg | ORAL_TABLET | Freq: Two times a day (BID) | ORAL | Status: DC
  Administered 2013-06-11: 500 mg via ORAL
  Filled 2013-06-11 (×2): qty 1

## 2013-06-11 MED ORDER — GABAPENTIN 600 MG PO TABS
600.0000 mg | ORAL_TABLET | Freq: Every day | ORAL | Status: DC
  Filled 2013-06-11: qty 1

## 2013-06-11 MED ORDER — OXYCODONE HCL ER 10 MG PO T12A
20.0000 mg | EXTENDED_RELEASE_TABLET | Freq: Two times a day (BID) | ORAL | Status: DC
  Filled 2013-06-11: qty 2

## 2013-06-11 MED ORDER — ASPIRIN 325 MG PO TBEC: 325.0000 mg | DELAYED_RELEASE_TABLET | Freq: Every day | ORAL | Status: DC

## 2013-06-11 MED ORDER — PANTOPRAZOLE SODIUM 40 MG PO TBEC
40.0000 mg | DELAYED_RELEASE_TABLET | Freq: Every day | ORAL | Status: DC
  Administered 2013-06-11: 40 mg via ORAL

## 2013-06-11 MED ORDER — OXYCODONE HCL ER 10 MG PO T12A
20.0000 mg | EXTENDED_RELEASE_TABLET | Freq: Three times a day (TID) | ORAL | Status: DC
  Administered 2013-06-11: 20 mg via ORAL
  Filled 2013-06-11: qty 2

## 2013-06-11 MED ORDER — FUROSEMIDE 40 MG PO TABS
40.0000 mg | ORAL_TABLET | Freq: Every day | ORAL | Status: DC
  Filled 2013-06-11: qty 1

## 2013-06-11 MED ORDER — OXYCODONE HCL ER 10 MG PO T12A
20.0000 mg | EXTENDED_RELEASE_TABLET | Freq: Two times a day (BID) | ORAL | Status: DC
  Administered 2013-06-11: 20 mg via ORAL

## 2013-06-11 MED ORDER — SODIUM CHLORIDE 0.9 % IV SOLN: INTRAVENOUS | Status: DC

## 2013-06-11 MED ORDER — ONDANSETRON HCL 4 MG/2ML IJ SOLN: 4.0000 mg | Freq: Four times a day (QID) | INTRAMUSCULAR | Status: DC | PRN

## 2013-06-11 MED ORDER — LISINOPRIL 10 MG PO TABS: 10.0000 mg | ORAL_TABLET | Freq: Every day | ORAL | Status: DC

## 2013-06-11 MED ORDER — ONDANSETRON HCL 4 MG/2ML IJ SOLN
4.0000 mg | Freq: Once | INTRAMUSCULAR | Status: AC
  Administered 2013-06-11: 4 mg via INTRAVENOUS
  Filled 2013-06-11: qty 2

## 2013-06-11 MED ORDER — ONDANSETRON HCL 4 MG PO TABS: 4.0000 mg | ORAL_TABLET | Freq: Four times a day (QID) | ORAL | Status: DC | PRN

## 2013-06-11 MED ORDER — ADULT MULTIVITAMIN W/MINERALS CH
1.0000 | ORAL_TABLET | Freq: Every day | ORAL | Status: DC
  Administered 2013-06-11: 1 via ORAL
  Filled 2013-06-11: qty 1

## 2013-06-11 MED ORDER — CALCIUM CARBONATE-VITAMIN D 500-200 MG-UNIT PO TABS
1.0000 | ORAL_TABLET | Freq: Two times a day (BID) | ORAL | Status: DC
  Administered 2013-06-11: 1 via ORAL
  Filled 2013-06-11 (×3): qty 1

## 2013-06-11 MED ORDER — IOHEXOL 350 MG/ML SOLN
100.0000 mL | Freq: Once | INTRAVENOUS | Status: AC | PRN
  Administered 2013-06-11: 100 mL via INTRAVENOUS

## 2013-06-11 MED ORDER — ACETAMINOPHEN 650 MG RE SUPP: 650.0000 mg | Freq: Four times a day (QID) | RECTAL | Status: DC | PRN

## 2013-06-11 MED ORDER — ACETAMINOPHEN 325 MG PO TABS
650.0000 mg | ORAL_TABLET | Freq: Four times a day (QID) | ORAL | Status: DC | PRN
  Administered 2013-06-11: 650 mg via ORAL
  Filled 2013-06-11: qty 2

## 2013-06-11 MED ORDER — OXYCODONE HCL ER 10 MG PO T12A: 20.0000 mg | EXTENDED_RELEASE_TABLET | Freq: Three times a day (TID) | ORAL | Status: DC

## 2013-06-11 MED ORDER — CYCLOBENZAPRINE HCL 10 MG PO TABS
10.0000 mg | ORAL_TABLET | Freq: Three times a day (TID) | ORAL | Status: DC | PRN
  Filled 2013-06-11: qty 1

## 2013-06-11 MED ORDER — POTASSIUM CHLORIDE CRYS ER 20 MEQ PO TBCR
20.0000 meq | EXTENDED_RELEASE_TABLET | Freq: Two times a day (BID) | ORAL | Status: DC
  Administered 2013-06-11: 20 meq via ORAL
  Filled 2013-06-11: qty 1

## 2013-06-11 MED ORDER — HYDRALAZINE HCL 20 MG/ML IJ SOLN: 10.0000 mg | INTRAMUSCULAR | Status: DC | PRN

## 2013-06-11 MED ORDER — ENOXAPARIN SODIUM 40 MG/0.4ML ~~LOC~~ SOLN
40.0000 mg | SUBCUTANEOUS | Status: DC
  Filled 2013-06-11: qty 0.4

## 2013-06-11 MED ORDER — FENTANYL CITRATE 0.05 MG/ML IJ SOLN
50.0000 ug | INTRAMUSCULAR | Status: DC | PRN
  Administered 2013-06-11: 50 ug via INTRAVENOUS
  Filled 2013-06-11: qty 2

## 2013-06-11 MED ORDER — FUROSEMIDE 40 MG PO TABS: 40.0000 mg | ORAL_TABLET | ORAL | Status: DC

## 2013-06-11 MED ORDER — ASPIRIN EC 325 MG PO TBEC
325.0000 mg | DELAYED_RELEASE_TABLET | Freq: Every day | ORAL | Status: DC
  Administered 2013-06-11: 325 mg via ORAL
  Filled 2013-06-11: qty 1

## 2013-06-11 MED ORDER — SODIUM CHLORIDE 0.9 % IJ SOLN
3.0000 mL | Freq: Two times a day (BID) | INTRAMUSCULAR | Status: DC
  Administered 2013-06-11: 3 mL via INTRAVENOUS

## 2013-06-11 NOTE — Progress Notes (Signed)
*  PRELIMINARY RESULTS* Echocardiogram 2D Echocardiogram has been performed.  Olivia [name] 06/11/2013, 4:07 PM

## 2013-06-11 NOTE — Progress Notes (Signed)
UR completed 

## 2013-06-11 NOTE — Telephone Encounter (Signed)
no

## 2013-06-11 NOTE — Progress Notes (Signed)
Pt was seen and examined.  Family updated at bedside (husband).  Orders reviewed.  Maryln Manuel, MD

## 2013-06-11 NOTE — ED Provider Notes (Signed)
CSN: [id number]     Arrival date & time 06/10/13  1830 History   First MD Initiated Contact with Patient 06/10/13 2330     Chief Complaint  Patient presents with  . Abdominal Pain   (Consider location/radiation/quality/duration/timing/severity/associated sxs/prior Treatment) HPI History provided by patient. Right-sided chest pain, upper chest, and dull in quality onset today. She also had a period of near-syncope and complains of generalized weakness since this afternoon. She has remote history of right-sided breast cancer status post chemotherapy, resection. She was evaluated at urgent care earlier tonight for the symptoms and was referred here for a PE study. She is currently pain-free. She denies any shortness of breath with the symptoms. No fever chills. No syncope. No calf pain or leg swelling. No history of DVT or PE. She does have some ongoing chronic back pain that radiates to her leg, but this is unchanged.   Past Medical History  Diagnosis Date  . Colon polyp   . Obesity   . Uterine fibroid     s/p embolization  . Lumbar degenerative disc disease   . History of breast cancer 2010    invasive ductal R breast; s/p lumpectomy, radiation and chemo  . Obesity   . Breast cancer, stage 1   . GERD (gastroesophageal reflux disease)    Past Surgical History  Procedure Laterality Date  . Breast lumpectomy  2010    right  . Tubal ligation  2010  . Colonoscopy  2010  . Power port    . Lumbar laminectomy/decompression microdiscectomy  2007, 2008    Dr. Gerlene Fee x 2  . Uterine fibroid embolization  2008 or 2009   Family History  Problem Relation Age of Onset  . Breast cancer Mother   . Cancer Maternal Grandmother     renal cancer   History  Substance Use Topics  . Smoking status: Never Smoker   . Smokeless tobacco: Never Used  . Alcohol Use: No   OB History   Grav Para Term Preterm Abortions TAB SAB Ect Mult Living                 Review of Systems  Constitutional:  Negative for fever and chills.  Respiratory: Negative for shortness of breath.   Cardiovascular: Positive for chest pain.  Gastrointestinal: Negative for abdominal pain.  Genitourinary: Negative for dysuria.  Musculoskeletal: Negative for back pain, neck pain and neck stiffness.  Skin: Negative for rash.  Neurological: Negative for seizures, facial asymmetry, speech difficulty, weakness, numbness and headaches.  All other systems reviewed and are negative.    Allergies  Adhesive and Morphine  Home Medications   Current Outpatient Rx  Name  Route  Sig  Dispense  Refill  . BIOTIN PO   Oral   Take 1 tablet by mouth daily.         . Calcium Carbonate-Vitamin D (CALCIUM + D) 600-200 MG-UNIT TABS   Oral   Take 2 tablets by mouth daily.           . cyclobenzaprine (FLEXERIL) 10 MG tablet   Oral   Take 10 mg by mouth 3 (three) times daily as needed.           [name] esomeprazole (NEXIUM) 40 MG capsule   Oral   Take 40 mg by mouth daily before breakfast.         . furosemide (LASIX) 40 MG tablet   Oral   Take 40 mg by mouth daily.         [name]  gabapentin (NEURONTIN) 600 MG tablet   Oral   Take 600 mg by mouth at bedtime.         . Multiple Vitamins-Minerals (MULTIVITAMIN WITH MINERALS) tablet   Oral   Take 1 tablet by mouth daily.           . nabumetone (RELAFEN) 500 MG tablet   Oral   Take 500 mg by mouth 2 (two) times daily.           [name] oxyCODONE (OXYCONTIN) 20 MG 12 hr tablet   Oral   Take 20 mg by mouth every 8 (eight) hours.         . potassium chloride SA (K-DUR,KLOR-CON) 20 MEQ tablet   Oral   Take 20 mEq by mouth 2 (two) times daily.          BP 148/99  Pulse 86  Temp(Src) 98.4 F (36.9 C) (Oral)  Resp 16  Wt 185 lb 4.8 oz (84.052 kg)  SpO2 100% Physical Exam  Constitutional: She is oriented to person, place, and time. She appears well-developed and well-nourished.  HENT:  Head: Normocephalic and atraumatic.  Eyes: EOM are normal.  Pupils are equal, round, and reactive to light.  Neck: Neck supple.  Cardiovascular: Normal rate, regular rhythm and intact distal pulses.   Pulmonary/Chest: Effort normal and breath sounds normal. No respiratory distress. She exhibits no tenderness.  Abdominal: Soft. She exhibits no distension. There is no tenderness.  Musculoskeletal: Normal range of motion.  No calf tenderness  Neurological: She is alert and oriented to person, place, and time.  Skin: Skin is warm and dry.    ED Course  Procedures (including critical care time) Results for orders placed during the hospital encounter of 06/10/13  CBC WITH DIFFERENTIAL      Result Value Range   WBC 6.3  4.0 - 10.5 K/uL   RBC 4.46  3.87 - 5.11 MIL/uL   Hemoglobin 13.9  12.0 - 15.0 g/dL   HCT 16.1  09.6 - 04.5 %   MCV 89.2  78.0 - 100.0 fL   MCH 31.2  26.0 - 34.0 pg   MCHC 34.9  30.0 - 36.0 g/dL   RDW 40.9  81.1 - 91.4 %   Platelets 259  150 - 400 K/uL   Neutrophils Relative % 58  43 - 77 %   Neutro Abs 3.6  1.7 - 7.7 K/uL   Lymphocytes Relative 36  12 - 46 %   Lymphs Abs 2.3  0.7 - 4.0 K/uL   Monocytes Relative 5  3 - 12 %   Monocytes Absolute 0.3  0.1 - 1.0 K/uL   Eosinophils Relative 1  0 - 5 %   Eosinophils Absolute 0.1  0.0 - 0.7 K/uL   Basophils Relative 0  0 - 1 %   Basophils Absolute 0.0  0.0 - 0.1 K/uL  POCT URINALYSIS DIP (DEVICE)      Result Value Range   Glucose, UA NEGATIVE  NEGATIVE mg/dL   Bilirubin Urine NEGATIVE  NEGATIVE   Ketones, ur NEGATIVE  NEGATIVE mg/dL   Specific Gravity, Urine 1.010  1.005 - 1.030   Hgb urine dipstick NEGATIVE  NEGATIVE   pH 6.0  5.0 - 8.0   Protein, ur NEGATIVE  NEGATIVE mg/dL   Urobilinogen, UA 0.2  0.0 - 1.0 mg/dL   Nitrite NEGATIVE  NEGATIVE   Leukocytes, UA NEGATIVE  NEGATIVE  POCT PREGNANCY, URINE      Result Value Range  Preg Test, Ur NEGATIVE  NEGATIVE  POCT I-STAT, CHEM 8      Result Value Range   Sodium 141  135 - 145 mEq/L   Potassium 3.4 (*) 3.5 - 5.1 mEq/L    Chloride 103  96 - 112 mEq/L   BUN 6  6 - 23 mg/dL   Creatinine, Ser 1.61  0.50 - 1.10 mg/dL   Glucose, Bld 096 (*) 70 - 99 mg/dL   Calcium, Ion 0.45  4.09 - 1.23 mmol/L   TCO2 26  0 - 100 mmol/L   Hemoglobin 14.6  12.0 - 15.0 g/dL   HCT 81.1  91.4 - 78.2 %   Dg Chest 2 View  06/10/2013   CLINICAL DATA:  Chest pain  EXAM: CHEST  2 VIEW  COMPARISON:  06/05/2012  FINDINGS: Cardiac shadow is within normal limits. The lungs are well aerated bilaterally. No focal infiltrate or sizable parenchymal nodule is noted. No acute bony abnormality is seen.  IMPRESSION: No acute abnormality noted.   Electronically Signed   By: Alcide Clever M.D.   On: 06/10/2013 17:01   Ct Angio Chest Pe W/cm &/or Wo Cm  06/11/2013   CLINICAL DATA:  Weakness. Shortness of breath. Chills. Previous lumpectomy for breast cancer.  EXAM: CT ANGIOGRAPHY CHEST WITH CONTRAST  TECHNIQUE: Multidetector CT imaging of the chest was performed using the standard protocol during bolus administration of intravenous contrast. Multiplanar CT image reconstructions including MIPs were obtained to evaluate the vascular anatomy.  CONTRAST:  OMNIPAQUE IOHEXOL 350 MG/ML SOLN  COMPARISON:  Chest radiographs obtained yesterday and chest CT dated 06/05/2012.  FINDINGS: Normally opacified pulmonary arteries with no pulmonary arterial filling defects seen. Clear lungs. No lung nodules or enlarged lymph nodes. 1.4 x 1.2 cm oval low density mass with peripheral coarse calcification in the subareolar region of the right breast, compatible with fat necrosis. There is also a mass in the posterior aspect of the inferior right breast, in the 6 o'clock position, measuring 2.7 x 2.1 cm in maximum dimensions on image number 57. There is a similar density at this location on 06/05/2012, measuring 2.4 x 2.0 cm at that time. This corresponds to the location of a larger postoperative fluid collection on 07/28/2009. Mild thoracic spine degenerative changes.  Review of  the MIP images confirms the above findings.  IMPRESSION: 1. No pulmonary emboli or acute abnormality. 2. Right breast postsurgical changes with fat necrosis and organized hematoma.   Electronically Signed   By: Gordan Payment M.D.   On: 06/11/2013 01:21      Date: 06/11/2013  Rate: 90  Rhythm: normal sinus rhythm  QRS Axis: normal  Intervals: normal  ST/T Wave abnormalities: nonspecific ST changes  Conduction Disutrbances:none  Narrative Interpretation:   Old EKG Reviewed: unchanged  IV fentanyl prn D/w Dr Toniann Fail - will admit 2:14 AM is pain free  MDM  Diagnosis: Chest pain  ECG, labs, PE study MED admit  Sunnie Nielsen, MD 06/11/13 9562

## 2013-06-11 NOTE — H&P (Signed)
[facility] History and Physical  Olivia [name] ZOX:[id number] DOB: 01/30/1966 DOA: 06/10/2013  Referring physician: ER physician. PCP: Lavonda Jumbo, MD  Specialists: Dr. Welton Flakes. Oncologist.  Chief Complaint: Chest pain.  HPI: Olivia [name] is a 47 y.o. female with history of breast cancer presently in remission presently followed by oncologist started experiencing right lower rib pain for last 2 days. Patient yesterday felt mildly dizzy and had gone to urgent care where patient was referred to the ER to rule out PE. CT angiogram was negative for PE. While in the ER patient started developing retrosternal chest pain which lasted for around 20 minutes and resolved. Patient is admitted to rule out ACS. Presently patient is chest pain-free. Denies any shortness of breath. Patient otherwise denies any nausea vomiting abdominal pain fever chills productive cough.   Review of Systems: As presented in the history of presenting illness, rest negative.  Past Medical History  Diagnosis Date  . Colon polyp   . Obesity   . Uterine fibroid     s/p embolization  . Lumbar degenerative disc disease   . History of breast cancer 2010    invasive ductal R breast; s/p lumpectomy, radiation and chemo  . Obesity   . Breast cancer, stage 1   . GERD (gastroesophageal reflux disease)    Past Surgical History  Procedure Laterality Date  . Breast lumpectomy  2010    right  . Tubal ligation  2010  . Colonoscopy  2010  . Power port    . Lumbar laminectomy/decompression microdiscectomy  2007, 2008    Dr. Gerlene Fee x 2  . Uterine fibroid embolization  2008 or 2009   Social History:  reports that she has never smoked. She has never used smokeless tobacco. She reports that she does not drink alcohol or use illicit drugs. Where does patient live home. Can patient participate in ADLs? Yes.  Allergies  Allergen Reactions  . Adhesive [Tape] Hives  . Morphine Hives    Family History:  Family History   Problem Relation Age of Onset  . Breast cancer Mother   . Cancer Maternal Grandmother     renal cancer      Prior to Admission medications   Medication Sig Start Date End Date Taking? Authorizing Provider  BIOTIN PO Take 1 tablet by mouth daily.   Yes Historical Provider, MD  Calcium Carbonate-Vitamin D (CALCIUM + D) 600-200 MG-UNIT TABS Take 2 tablets by mouth daily.     Yes Historical Provider, MD  cyclobenzaprine (FLEXERIL) 10 MG tablet Take 10 mg by mouth 3 (three) times daily as needed.     Yes Historical Provider, MD  esomeprazole (NEXIUM) 40 MG capsule Take 40 mg by mouth daily before breakfast.   Yes Historical Provider, MD  furosemide (LASIX) 40 MG tablet Take 40 mg by mouth daily.   Yes Historical Provider, MD  gabapentin (NEURONTIN) 600 MG tablet Take 600 mg by mouth at bedtime.   Yes Historical Provider, MD  Multiple Vitamins-Minerals (MULTIVITAMIN WITH MINERALS) tablet Take 1 tablet by mouth daily.     Yes Historical Provider, MD  nabumetone (RELAFEN) 500 MG tablet Take 500 mg by mouth 2 (two) times daily.     Yes Historical Provider, MD  oxyCODONE (OXYCONTIN) 20 MG 12 hr tablet Take 20 mg by mouth every 8 (eight) hours.   Yes Historical Provider, MD  potassium chloride SA (K-DUR,KLOR-CON) 20 MEQ tablet Take 20 mEq by mouth 2 (two) times daily.   Yes Historical  Provider, MD    Physical Exam: Filed Vitals:   06/11/13 0000 06/11/13 0215 06/11/13 0230 06/11/13 0321  BP: 148/99 153/98 159/98 163/105  Pulse: 86 84 78 119  Temp:    98.2 F (36.8 C)  TempSrc:    Oral  Resp:    18  Height:    5\' 6"  (1.676 m)  Weight:    83.462 kg (184 lb)  SpO2: 100% 100% 100% 100%     General:  Well-developed and nourished.  Eyes: Anicteric no pallor.  ENT: No discharge from ears eyes nose mouth.  Neck: No mass felt.  Cardiovascular: S1-S2 heard.  Respiratory: No rhonchi or crepitations.  Abdomen: Soft nontender bowel sounds present.  Skin: No erythema or rash specifically  around the breast where patient had surgery.  Musculoskeletal: No edema.  Psychiatric: Appears normal.  Neurologic: Alert awake oriented to time place and person. Moves all extremities.  Labs on Admission:  Basic Metabolic Panel:  Recent Labs Lab 06/04/13 1414 06/10/13 1625  NA 145 141  K 3.2* 3.4*  CL  --  103  CO2 28  --   GLUCOSE 85 106*  BUN 7.8 6  CREATININE 0.8 1.00  CALCIUM 9.7  --    Liver Function Tests:  Recent Labs Lab 06/04/13 1414  AST 19  ALT 13  ALKPHOS 77  BILITOT 0.49  PROT 7.6  ALBUMIN 3.7   No results found for this basename: LIPASE, AMYLASE,  in the last 168 hours No results found for this basename: AMMONIA,  in the last 168 hours CBC:  Recent Labs Lab 06/04/13 1414 06/10/13 1613 06/10/13 1625  WBC 5.7 6.3  --   NEUTROABS 2.7 3.6  --   HGB 13.2 13.9 14.6  HCT 40.2 39.8 43.0  MCV 91.1 89.2  --   PLT 249 259  --    Cardiac Enzymes: No results found for this basename: CKTOTAL, CKMB, CKMBINDEX, TROPONINI,  in the last 168 hours  BNP (last 3 results) No results found for this basename: PROBNP,  in the last 8760 hours CBG: No results found for this basename: GLUCAP,  in the last 168 hours  Radiological Exams on Admission: Dg Chest 2 View  06/10/2013   CLINICAL DATA:  Chest pain  EXAM: CHEST  2 VIEW  COMPARISON:  06/05/2012  FINDINGS: Cardiac shadow is within normal limits. The lungs are well aerated bilaterally. No focal infiltrate or sizable parenchymal nodule is noted. No acute bony abnormality is seen.  IMPRESSION: No acute abnormality noted.   Electronically Signed   By: Alcide Clever M.D.   On: 06/10/2013 17:01   Ct Angio Chest Pe W/cm &/or Wo Cm  06/11/2013   CLINICAL DATA:  Weakness. Shortness of breath. Chills. Previous lumpectomy for breast cancer.  EXAM: CT ANGIOGRAPHY CHEST WITH CONTRAST  TECHNIQUE: Multidetector CT imaging of the chest was performed using the standard protocol during bolus administration of intravenous  contrast. Multiplanar CT image reconstructions including MIPs were obtained to evaluate the vascular anatomy.  CONTRAST:  OMNIPAQUE IOHEXOL 350 MG/ML SOLN  COMPARISON:  Chest radiographs obtained yesterday and chest CT dated 06/05/2012.  FINDINGS: Normally opacified pulmonary arteries with no pulmonary arterial filling defects seen. Clear lungs. No lung nodules or enlarged lymph nodes. 1.4 x 1.2 cm oval low density mass with peripheral coarse calcification in the subareolar region of the right breast, compatible with fat necrosis. There is also a mass in the posterior aspect of the inferior right breast, in the  6 o'clock position, measuring 2.7 x 2.1 cm in maximum dimensions on image number 57. There is a similar density at this location on 06/05/2012, measuring 2.4 x 2.0 cm at that time. This corresponds to the location of a larger postoperative fluid collection on 07/28/2009. Mild thoracic spine degenerative changes.  Review of the MIP images confirms the above findings.  IMPRESSION: 1. No pulmonary emboli or acute abnormality. 2. Right breast postsurgical changes with fat necrosis and organized hematoma.   Electronically Signed   By: Gordan Payment M.D.   On: 06/11/2013 01:21    EKG: Independently reviewed. Normal sinus rhythm.  Assessment/Plan Principal Problem:   Chest pain Active Problems:   Breast cancer, stage 1   1. Chest pain - cycle cardiac markers. Check 2-D echo. 2. Elevated blood pressure - for now I have placed patient on when necessary IV hydralazine. Closely follow blood pressure trends. 3. History of breast cancer - patient follows with Dr. Welton Flakes. I also advised patient to consult Dr. Welton Flakes with regarding her right lower rib pain for which he may require bone scan. Patient and patient's husband has agreed to do so. Patient's CAT scan chest and to rule out PE to show organized hematoma and fat necrosis around the surgical site of the breast for which I have discussed the on call  surgeon and and the signing echo and the surgeon had expected from previous surgery and has advised to follow with patient's surgeon as outpatient. Presently the surgical site does not show any acute changes. 4. Chronic low back pain - continue present medications.    Code Status: Full code.  Family Communication: Patient's husband at the bedside.  Disposition Plan: Admit for observation.    Norbert Malkin N. [facility] Pager [phone].  If 7PM-7AM, please contact night-coverage [email or web address] Password [facility] 06/11/2013, 6:43 AM

## 2013-06-11 NOTE — Discharge Summary (Signed)
Physician Discharge Summary  Olivia [name]:[id number] DOB: [birth year] DOA: 06/10/2013  PCP: Lavonda Jumbo, MD  Admit date: 06/10/2013 Discharge date: 06/11/2013  Recommendations for Outpatient Follow-up:  Follow up results of Echocardiogram Return if symptoms recur, worsen or new problems develop. See your general surgeon to evaluate breast hematoma See your Gastroenterologist to reschedule EGD and colonoscopy study See your oncologist to evaluate rib pain further  Discharge Diagnoses:  Principal Problem:   Chest pain Active Problems:   Breast cancer, stage 1   GERD (gastroesophageal reflux disease)   Generalized anxiety disorder  Discharge Condition: stable   Diet recommendation: heart healthy  Filed Weights   06/10/13 1835 06/11/13 0321  Weight: 185 lb 4.8 oz (84.052 kg) 184 lb (83.462 kg)    History of present illness:  BRIDGITTE [name] is a 47 y.o. female with history of breast cancer presently in remission presently followed by oncologist started experiencing right lower rib pain for last 2 days. Patient yesterday felt mildly dizzy and had gone to urgent care where patient was referred to the ER to rule out PE. CT angiogram was negative for PE. While in the ER patient started developing retrosternal chest pain which lasted for around 20 minutes and resolved. Patient is admitted to rule out ACS. Presently patient is chest pain-free. Denies any shortness of breath. Patient otherwise denies any nausea vomiting abdominal pain fever chills productive cough.   Hospital Course:  1. Chest pain - cycle cardiac markers. 2D Echo pending at time of discharge. Pt has significant GI symptoms and advised to reschedule her EGD and colonoscopy ASAP with Dr. Jarold Motto (GI).   2. Elevated blood pressure - Hypertension - Home on lisinopril 10 mg daily.   3. History of breast cancer - patient follows with Dr. Welton Flakes. I also advised patient to consult Dr. Welton Flakes with regarding her right lower rib  pain for which he may require bone scan. Patient and patient's husband has agreed to do so. Patient's CAT scan chest ruled out PE and showed organized hematoma and fat necrosis around the surgical site of the breast for which I have discussed the on call surgeon and and the signing echo and the surgeon had expected from previous surgery and has advised to follow with patient's surgeon as outpatient. Presently the surgical site does not show any acute changes. 4. Chronic low back pain - continue present home medications.  Procedures: ECHO pending at discharge  Discharge Exam: Pt reports no further chest pain discomfort.  Tolerating diet well.  No distress Filed Vitals:   06/11/13 1350  BP: 134/84  Pulse: 73  Temp: 98.3 F (36.8 C)  Resp: 18    General: awake, alert, no distress Cardiovascular: normal s1, s2 sounds Respiratory: BBS clear to auscultation  Discharge Instructions  Discharge Orders   Future Appointments Provider Department Dept Phone   12/03/2013 2:45 PM Mauri [facility] [phone]   12/03/2013 3:15 PM Illa Level, NP [facility] [phone]   Future Orders Complete By Expires   Diet - low sodium heart healthy  As directed    Discharge instructions  As directed    Comments:     Return if symptoms recur, worsen or new problems develop. See your general surgeon to evaluate breast hematoma See your Gastroenterologist to reschedule EGD and colonoscopy study See your oncologist to evaluate rib pain further   Discontinue IV  As directed    Increase activity slowly  As directed        Medication List         aspirin 325 MG EC tablet  Take 1 tablet (325 mg total) by mouth daily.     BIOTIN PO  Take 1 tablet by mouth daily.     Calcium Carbonate-Vitamin D 600-200 MG-UNIT Tabs  Take 2 tablets by mouth daily.     cyclobenzaprine 10 MG tablet  Commonly known as:  FLEXERIL  Take 10 mg by  mouth 3 (three) times daily as needed.     esomeprazole 40 MG capsule  Commonly known as:  NEXIUM  Take 40 mg by mouth daily before breakfast.     furosemide 40 MG tablet  Commonly known as:  LASIX  Take 1 tablet (40 mg total) by mouth every other day.     gabapentin 600 MG tablet  Commonly known as:  NEURONTIN  Take 600 mg by mouth at bedtime.     lisinopril 10 MG tablet  Commonly known as:  PRINIVIL,ZESTRIL  Take 1 tablet (10 mg total) by mouth daily.     multivitamin with minerals tablet  Take 1 tablet by mouth daily.     nabumetone 500 MG tablet  Commonly known as:  RELAFEN  Take 500 mg by mouth 2 (two) times daily.     oxyCODONE 20 MG 12 hr tablet  Commonly known as:  OXYCONTIN  Take 20 mg by mouth every 8 (eight) hours.     potassium chloride SA 20 MEQ tablet  Commonly known as:  K-DUR,KLOR-CON  Take 20 mEq by mouth 2 (two) times daily.       Allergies  Allergen Reactions  . Adhesive [Tape] Hives  . Morphine Hives       Follow-up Information   Follow up with KNAPP,EVE A, MD. Schedule an appointment as soon as possible for a visit in 1 week.   Specialty:  Family Medicine   Contact information:   [address] [place] [address] [phone]       Follow up with Sheryn Bison, MD. Schedule an appointment as soon as possible for a visit in 1 week.   Specialty:  Gastroenterology   Contact information:   [address] [place] [address] [phone]       Follow up with Drue Second, MD. Schedule an appointment as soon as possible for a visit in 2 weeks.   Specialty:  Oncology   Contact information:   [address] [place] [address] [phone]       The results of significant diagnostics from this hospitalization (including imaging, microbiology, ancillary and laboratory) are listed below for reference.    Significant Diagnostic Studies: Dg Chest 2 View  06/10/2013   CLINICAL DATA:  Chest pain  EXAM: CHEST  2  VIEW  COMPARISON:  06/05/2012  FINDINGS: Cardiac shadow is within normal limits. The lungs are well aerated bilaterally. No focal infiltrate or sizable parenchymal nodule is noted. No acute bony abnormality is seen.  IMPRESSION: No acute abnormality noted.   Electronically Signed   By: Alcide Clever M.D.   On: 06/10/2013 17:01   Ct Angio Chest Pe W/cm &/or Wo Cm  06/11/2013   CLINICAL DATA:  Weakness. Shortness of breath. Chills. Previous lumpectomy for breast cancer.  EXAM: CT ANGIOGRAPHY CHEST WITH CONTRAST  TECHNIQUE: Multidetector CT imaging of the chest was performed using the standard protocol during bolus administration of intravenous contrast. Multiplanar CT image reconstructions including MIPs were obtained  to evaluate the vascular anatomy.  CONTRAST:  OMNIPAQUE IOHEXOL 350 MG/ML SOLN  COMPARISON:  Chest radiographs obtained yesterday and chest CT dated 06/05/2012.  FINDINGS: Normally opacified pulmonary arteries with no pulmonary arterial filling defects seen. Clear lungs. No lung nodules or enlarged lymph nodes. 1.4 x 1.2 cm oval low density mass with peripheral coarse calcification in the subareolar region of the right breast, compatible with fat necrosis. There is also a mass in the posterior aspect of the inferior right breast, in the 6 o'clock position, measuring 2.7 x 2.1 cm in maximum dimensions on image number 57. There is a similar density at this location on 06/05/2012, measuring 2.4 x 2.0 cm at that time. This corresponds to the location of a larger postoperative fluid collection on 07/28/2009. Mild thoracic spine degenerative changes.  Review of the MIP images confirms the above findings.  IMPRESSION: 1. No pulmonary emboli or acute abnormality. 2. Right breast postsurgical changes with fat necrosis and organized hematoma.   Electronically Signed   By: Gordan Payment M.D.   On: 06/11/2013 01:21    Microbiology: No results found for this or any previous visit (from the past 240  hour(s)).   Labs: Basic Metabolic Panel:  Recent Labs Lab 06/10/13 1625 06/11/13 0805  NA 141 141  K 3.4* 3.5  CL 103 109  CO2  --  24  GLUCOSE 106* 94  BUN 6 5*  CREATININE 1.00 0.74  CALCIUM  --  9.1   Liver Function Tests:  Recent Labs Lab 06/11/13 0805  AST 25  ALT 16  ALKPHOS 71  BILITOT 0.3  PROT 7.4  ALBUMIN 3.6   No results found for this basename: LIPASE, AMYLASE,  in the last 168 hours No results found for this basename: AMMONIA,  in the last 168 hours CBC:  Recent Labs Lab 06/10/13 1613 06/10/13 1625 06/11/13 0805  WBC 6.3  --  5.1  NEUTROABS 3.6  --  2.3  HGB 13.9 14.6 13.9  HCT 39.8 43.0 40.1  MCV 89.2  --  90.5  PLT 259  --  231   Cardiac Enzymes:  Recent Labs Lab 06/11/13 0805 06/11/13 1400  TROPONINI <0.30 <0.30   BNP: BNP (last 3 results) No results found for this basename: PROBNP,  in the last 8760 hours CBG: No results found for this basename: GLUCAP,  in the last 168 hours  Signed:  Trust Leh  [facility] 06/11/2013, 5:10 PM

## 2013-06-12 ENCOUNTER — Encounter: Payer: PRIVATE HEALTH INSURANCE | Admitting: Gastroenterology

## 2013-06-12 NOTE — Telephone Encounter (Signed)
Pt to ER for chest pain. Per discharge instructions, she is to r/s her ECL.

## 2013-06-17 ENCOUNTER — Ambulatory Visit (INDEPENDENT_AMBULATORY_CARE_PROVIDER_SITE_OTHER): Payer: PRIVATE HEALTH INSURANCE | Admitting: Family Medicine

## 2013-06-17 ENCOUNTER — Encounter: Payer: Self-pay | Admitting: Family Medicine

## 2013-06-17 VITALS — BP 150/108 | HR 88 | Temp 98.4°F | Ht 66.0 in | Wt 183.0 lb

## 2013-06-17 DIAGNOSIS — R030 Elevated blood-pressure reading, without diagnosis of hypertension: Secondary | ICD-10-CM

## 2013-06-17 DIAGNOSIS — R071 Chest pain on breathing: Secondary | ICD-10-CM | POA: Diagnosis not present

## 2013-06-17 DIAGNOSIS — I10 Essential (primary) hypertension: Secondary | ICD-10-CM

## 2013-06-17 DIAGNOSIS — R9389 Abnormal findings on diagnostic imaging of other specified body structures: Secondary | ICD-10-CM

## 2013-06-17 DIAGNOSIS — R931 Abnormal findings on diagnostic imaging of heart and coronary circulation: Secondary | ICD-10-CM | POA: Insufficient documentation

## 2013-06-17 DIAGNOSIS — IMO0001 Reserved for inherently not codable concepts without codable children: Secondary | ICD-10-CM

## 2013-06-17 DIAGNOSIS — R0789 Other chest pain: Secondary | ICD-10-CM

## 2013-06-17 DIAGNOSIS — J069 Acute upper respiratory infection, unspecified: Secondary | ICD-10-CM

## 2013-06-17 NOTE — Progress Notes (Signed)
Chief Complaint  Patient presents with  . Follow-up    went to Urgent Care for SOB, chest pain, and dehydration. Sent over to ER at [facility] and then admitted.   . Facial Pain    having sinus pain/pressure and sinus headache, also some facial pain and tenderness for several days.    Patient presents for hospital follow-up.  She was hospitalized from 11/12 to 06/11/13.  Patient presented to urgent care with complaint of right lower rib pain for last 2 days. She also had dizziness; she was referred to the ER to rule out PE. CT angiogram was negative for PE. While in the ER patient started developing retrosternal chest pain which lasted for around 20 minutes and resolved. Patient was admitted and ruled out for ACS. She had echocardiogram done prior to discharge (didn't get results). She was discharged on lisinopril for high blood pressure.  She was given a dose in the hospital, and thinks that caused side effects (headache, chest pain); she was never told why she was being put on that medication, seemed frustrated re: lack of communication regarding this medication. She never filled the prescription. She is here to follow up today on studies done in hospital, follow up on her blood pressure, and also has complaints of facial pain/headaches.  She was to f/u with her oncologist regarding hematoma, and is to reschedule her EGD and colonoscopy that had to be rescheduled with her GI due to her hospitalization and dehydration.  She is complaining of pain at her left temple, and that her cheek, in front of her left ear is very sensitive to the touch.  Face is no longer sensitive, but still tender at her left temple.  She thinks this feels like her sinus pain.  She did have some pain behind her left eye for the last two days, but improved today. Currently only having L temporal headache.  Her nose feels dry--not having runny nose, PND, sore throat or cough.  She has been taking zyrtec, which helps with the "dryness" of  her nose, but not resolving the headache.  Disk in neck causes headaches, but this feels like sinuses, not related to her neck.  She has chronic pain in neck and back which can contribute to her BP, with her pain being 8/10 during visit.   Denies fevers now. She did have fever, chills, nausea when she originally went to the ER.  Hospital Course:  1. Chest pain - cycle cardiac markers. 2D Echo pending at time of discharge. Pt has significant GI symptoms and advised to reschedule her EGD and colonoscopy ASAP with Dr. Jarold Motto (GI).  2. Elevated blood pressure - Hypertension - Home on lisinopril 10 mg daily.  3. History of breast cancer - patient follows with Dr. Welton Flakes. I also advised patient to consult Dr. Welton Flakes with regarding her right lower rib pain for which he may require bone scan. Patient and patient's husband has agreed to do so. Patient's CAT scan chest ruled out PE and showed organized hematoma and fat necrosis around the surgical site of the breast for which I have discussed the on call surgeon and and the signing echo and the surgeon had expected from previous surgery and has advised to follow with patient's surgeon as outpatient. Presently the surgical site does not show any acute changes. 4. Chronic low back pain - continue present home medications.  Past Medical History  Diagnosis Date  . Colon polyp   . Obesity   . Uterine fibroid  s/p embolization  . Lumbar degenerative disc disease   . History of breast cancer 2010    invasive ductal R breast; s/p lumpectomy, radiation and chemo  . Obesity   . Breast cancer, stage 1   . GERD (gastroesophageal reflux disease)    Past Surgical History  Procedure Laterality Date  . Breast lumpectomy  2010    right  . Tubal ligation  2010  . Colonoscopy  2010  . Power port    . Lumbar laminectomy/decompression microdiscectomy  2007, 2008    Dr. Gerlene Fee x 2  . Uterine fibroid embolization  2008 or 2009   History   Social History  .  Marital Status: Married    Spouse Name: N/A    Number of Children: N/A  . Years of Education: N/A   Occupational History  . Not on file.   Social History Main Topics  . Smoking status: Never Smoker   . Smokeless tobacco: Never Used  . Alcohol Use: No  . Drug Use: No  . Sexual Activity: Yes   Other Topics Concern  . Not on file   Social History Narrative  . No narrative on file   Current outpatient prescriptions:BIOTIN PO, Take 1 tablet by mouth daily., Disp: , Rfl: ;  Calcium Carbonate-Vitamin D (CALCIUM + D) 600-200 MG-UNIT TABS, Take 2 tablets by mouth daily.  , Disp: , Rfl: ;  cetirizine (ZYRTEC) 10 MG tablet, Take 10 mg by mouth daily., Disp: , Rfl: ;  cyclobenzaprine (FLEXERIL) 10 MG tablet, Take 10 mg by mouth 3 (three) times daily as needed.  , Disp: , Rfl:  esomeprazole (NEXIUM) 40 MG capsule, Take 40 mg by mouth daily before breakfast., Disp: , Rfl: ;  gabapentin (NEURONTIN) 600 MG tablet, Take 600 mg by mouth at bedtime., Disp: , Rfl: ;  Multiple Vitamins-Minerals (MULTIVITAMIN WITH MINERALS) tablet, Take 1 tablet by mouth daily.  , Disp: , Rfl: ;  nabumetone (RELAFEN) 500 MG tablet, Take 500 mg by mouth 2 (two) times daily.  , Disp: , Rfl:  oxyCODONE (OXYCONTIN) 20 MG 12 hr tablet, Take 20 mg by mouth every 8 (eight) hours., Disp: , Rfl: ;  potassium chloride SA (K-DUR,KLOR-CON) 20 MEQ tablet, Take 20 mEq by mouth 2 (two) times daily., Disp: , Rfl: ;  aspirin EC 325 MG EC tablet, Take 1 tablet (325 mg total) by mouth daily., Disp: 30 tablet, Rfl: 0;  furosemide (LASIX) 40 MG tablet, Take 1 tablet (40 mg total) by mouth every other day., Disp: 30 tablet, Rfl:   Allergies  Allergen Reactions  . Adhesive [Tape] Hives  . Morphine Hives   ROS:  Denies fevers, chills, nausea, vomiting, diarrhea, abdominal pain.  No further dizziness.  Slightly lightheaded earlier today.  Denies chest pain.  Still has some mild discomfort in R lower chest.  No bleeding/bruising.  She has rapid heart  rate since her chemo  PHYSICAL EXAM: BP 150/100  Pulse 88  Temp(Src) 98.4 F (36.9 C) (Oral)  Ht 5\' 6"  (1.676 m)  Wt 183 lb (83.008 kg)  BMI 29.55 kg/m2 150/108 on LA by MD Pleasant female, intermittently appears frustrated, and at one point on verge of tears.  She is in no acute distress HEENT:  PERRL, EOMI, conjunctiva clear. TM's and EAC's normal.  Temporalis muscles and temporal arteries are nontender.  Nasal mucosa is edematous with thick white mucus on L>R. No erythema.  Sinuses nontender (but area of discomfort had been lateral L maxillary sinus). OP  clear Neck: no lymphadenopathy, thyromegaly or mass Heart: regular rate and rhythm, rate around 90.   Lungs: clear bilaterally Chest:  Chest wall mildly tender R lower chest Abdomen: soft, nontender, no organomegaly or mass Extremities: no edema Skin: no rash Psych: mildly anxious, no depression.normal hygiene, grooming, eye contact and speech Neuro: alert and oriented.  Normal gait, cranial nerves  Echo results:  Study Conclusions: - Left ventricle: The cavity size was normal. Wall thickness was increased in a pattern of mild LVH. The estimated ejection fraction was 50%. Diffuse hypokinesis. Features are consistent with a pseudonormal left ventricular filling pattern, with concomitant abnormal relaxation and increased filling pressure (grade 2 diastolic dysfunction). - Aortic valve: There was no stenosis. - Mitral valve: Mildly calcified annulus. Mildly calcified leaflets . - Left atrium: The atrium was mildly dilated. - Right ventricle: The cavity size was normal. Systolic function was normal. - Right atrium: 1.3 x 1.3 cm mass in right atrium. - Pulmonary arteries: No complete TR doppler jet so unable to estimate PA systolic pressure. - Inferior vena cava: The vessel was normal in size; the respirophasic diameter changes were in the normal range (= 50%); findings are consistent with normal central  venous pressure. Impressions:  - Normal LV size with mild LV hypertrophy. There was mild global hypokinesis with EF 50%. Normal RV size and systolic function. 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 This appears to be more than just invagination of the wall. Suggest TEE to evaluate.  ASSESSMENT/PLAN:  Essential hypertension, benign - declines starting med, prefers cardiology eval ; BP likely affected by level of pain.  LVH and diastolic dysfunction on echo--encouraged treatment of HTN  Elevated blood pressure - Plan: Ambulatory referral to Cardiology  Abnormal echocardiogram - possible atrial mass.  refer for TEE and cardiac consult.  previously saw Dr. Tenny Craw  Chest wall pain  Acute upper respiratory infections of unspecified site - supportive management reviewed.  avoid decongestants.  s/sx of bacterial infection reviewed  REFER FOR TEE Cardiology referral--pt prefers to await cardiology eval to start BP meds.  r chest pain--hematoma Reassured not likely gall bladder

## 2013-06-17 NOTE — Patient Instructions (Signed)
We are referring you for transesophageal echocardiogram, and to the cardiologist. You will need to have your blood pressure treated.  Use mucinex and sinus rinses to help with sinus pain. Call for antibiotics if mucus turns darker yellow-green or you develop fever.

## 2013-06-19 ENCOUNTER — Encounter: Payer: PRIVATE HEALTH INSURANCE | Admitting: Gastroenterology

## 2013-06-23 ENCOUNTER — Telehealth: Payer: Self-pay | Admitting: Family Medicine

## 2013-06-23 NOTE — Telephone Encounter (Signed)
Advise pt--she was given 10mg  lisinopril from hospital, which is a very low dose.  If she is nervous/anxious about taking same tablet, she can cut dose in 1/2, but not likely to be adequate dose to get BP to goal, but fine to start out (can start out at 1/2 tablet, and if tolerating and BP still high, increase to full tab).

## 2013-06-24 ENCOUNTER — Telehealth: Payer: Self-pay | Admitting: *Deleted

## 2013-06-24 DIAGNOSIS — I10 Essential (primary) hypertension: Secondary | ICD-10-CM

## 2013-06-24 MED ORDER — LISINOPRIL 10 MG PO TABS: 10.0000 mg | ORAL_TABLET | Freq: Every day | ORAL | Status: DC

## 2013-06-24 NOTE — Telephone Encounter (Signed)
Spoke with patient and she will start out taking 1/2 tablet on the lisinopril and increase to full tablet if she does not have side effects and her bp's are still abnormal. She does not know where actual rx is from the hospital and would like to know if we can rx for her to [facility] in [place]. Please advise, thanks.

## 2013-06-24 NOTE — Telephone Encounter (Signed)
Advise pt that same rx she was given was sent to her local pharmacy (directions don't state to start at 1/2, but given her concern about possible side effect, it is fine for her to start that way, it just won't say that on the bottle).  Looks like her appt with Dr. Tenny Craw is scheduled for 12/1

## 2013-06-24 NOTE — Addendum Note (Signed)
Addended byJoselyn Arrow on: 06/24/2013 01:10 PM   Modules accepted: Orders

## 2013-06-29 ENCOUNTER — Encounter: Payer: Self-pay | Admitting: Internal Medicine

## 2013-06-29 ENCOUNTER — Ambulatory Visit (INDEPENDENT_AMBULATORY_CARE_PROVIDER_SITE_OTHER): Payer: PRIVATE HEALTH INSURANCE | Admitting: Internal Medicine

## 2013-06-29 ENCOUNTER — Ambulatory Visit (INDEPENDENT_AMBULATORY_CARE_PROVIDER_SITE_OTHER): Payer: Self-pay | Admitting: Surgery

## 2013-06-29 VITALS — BP 154/110 | HR 87 | Ht 66.0 in | Wt 184.0 lb

## 2013-06-29 DIAGNOSIS — I10 Essential (primary) hypertension: Secondary | ICD-10-CM

## 2013-06-29 HISTORY — PX: OTHER SURGICAL HISTORY: SHX169

## 2013-06-29 LAB — BASIC METABOLIC PANEL
BUN: 11 mg/dL (ref 6–23)
CO2: 30 mEq/L (ref 19–32)
Calcium: 9.5 mg/dL (ref 8.4–10.5)
Creatinine, Ser: 0.8 mg/dL (ref 0.4–1.2)
GFR: 97.53 mL/min (ref >60.00)
Glucose, Bld: 82 mg/dL (ref 70–99)
Potassium: 3.4 mEq/L — ABNORMAL LOW (ref 3.5–5.1)
Sodium: 139 mEq/L (ref 135–145)

## 2013-06-29 NOTE — Progress Notes (Addendum)
HPI Patient is a 47 yo who is referred for abnormal echo   There patient was admitted in November for CP   R/O for MI  CP was atypical.  Echo done that showed question of R atrial mass. She is referred for evaluation. The patient has a history of HTN. Since d/c she has had some CP  Not associated with activity  Allergies  Allergen Reactions  . Adhesive [Tape] Hives  . Morphine Hives    Current Outpatient Prescriptions  Medication Sig Dispense Refill  . BIOTIN PO Take 1 tablet by mouth daily.      . Calcium Carbonate-Vitamin D (CALCIUM + D) 600-200 MG-UNIT TABS Take 2 tablets by mouth daily.        . cetirizine (ZYRTEC) 10 MG tablet Take 10 mg by mouth daily.      . cyclobenzaprine (FLEXERIL) 10 MG tablet Take 10 mg by mouth 3 (three) times daily as needed.        [name] esomeprazole (NEXIUM) 40 MG capsule Take 40 mg by mouth daily before breakfast.      . furosemide (LASIX) 40 MG tablet Take 1 tablet (40 mg total) by mouth every other day.  30 tablet    . gabapentin (NEURONTIN) 600 MG tablet Take 600 mg by mouth at bedtime.      . Multiple Vitamins-Minerals (MULTIVITAMIN WITH MINERALS) tablet Take 1 tablet by mouth daily.        . nabumetone (RELAFEN) 500 MG tablet Take 500 mg by mouth 2 (two) times daily.        [name] oxyCODONE (OXYCONTIN) 20 MG 12 hr tablet Take 20 mg by mouth 3 (three) times daily.       . potassium chloride SA (K-DUR,KLOR-CON) 20 MEQ tablet Take 20 mEq by mouth every other day.       . lisinopril (PRINIVIL,ZESTRIL) 10 MG tablet Take 1 tablet (10 mg total) by mouth daily.  30 tablet  0   No current facility-administered medications for this visit.    Past Medical History  Diagnosis Date  . Colon polyp   . Obesity   . Uterine fibroid     s/p embolization  . Lumbar degenerative disc disease   . History of breast cancer 2010    invasive ductal R breast; s/p lumpectomy, radiation and chemo  . Obesity   . Breast cancer, stage 1   . GERD (gastroesophageal reflux  disease)     Past Surgical History  Procedure Laterality Date  . Breast lumpectomy  2010    right  . Tubal ligation  2010  . Colonoscopy  2010  . Power port    . Lumbar laminectomy/decompression microdiscectomy  2007, 2008    Dr. Gerlene Fee x 2  . Uterine fibroid embolization  2008 or 2009    Family History  Problem Relation Age of Onset  . Breast cancer Mother   . Cancer Maternal Grandmother     renal cancer    History   Social History  . Marital Status: Married    Spouse Name: N/A    Number of Children: N/A  . Years of Education: N/A   Occupational History  . Not on file.   Social History Main Topics  . Smoking status: Never Smoker   . Smokeless tobacco: Never Used  . Alcohol Use: No  . Drug Use: No  . Sexual Activity: Yes   Other Topics Concern  . Not on file   Social History Narrative  . No  narrative on file    Review of Systems:  All systems reviewed.  They are negative to the above problem except as previously stated.  Vital Signs: BP 154/110  Pulse 87  Ht 5\' 6"  (1.676 m)  Wt 184 lb (83.462 kg)  BMI 29.71 kg/m2  SpO2 95%  Physical Exam Patient is in NAD HEENT:  Normocephalic, atraumatic. EOMI, PERRLA.  Neck: JVP is normal.  No bruits.  Lungs: clear to auscultation. No rales no wheezes.  Heart: Regular rate and rhythm. Normal S1, S2. No S3.   No significant murmurs. PMI not displaced.  Abdomen:  Supple, nontender. Normal bowel sounds. No masses. No hepatomegaly.  Extremities:   Good distal pulses throughout. No lower extremity edema.  Musculoskeletal :moving all extremities.  Neuro:   alert and oriented x3.  CN II-XII grossly intact.   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  HTN  Would restart lisinoprl  Check BMET   3.  CP  Atypical   4.  Hx of breast CA

## 2013-06-29 NOTE — Patient Instructions (Addendum)
Your physician recommends that you schedule a follow-up appointment in: 1 MONTH WITH DR. Tenny Craw( PER DR. 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 Please refer to the Current Medication list given to you today.

## 2013-07-10 ENCOUNTER — Other Ambulatory Visit: Payer: Self-pay | Admitting: *Deleted

## 2013-07-10 ENCOUNTER — Encounter (INDEPENDENT_AMBULATORY_CARE_PROVIDER_SITE_OTHER): Payer: Self-pay | Admitting: Surgery

## 2013-07-10 ENCOUNTER — Ambulatory Visit (INDEPENDENT_AMBULATORY_CARE_PROVIDER_SITE_OTHER): Payer: PRIVATE HEALTH INSURANCE | Admitting: Surgery

## 2013-07-10 ENCOUNTER — Telehealth: Payer: Self-pay | Admitting: Internal Medicine

## 2013-07-10 VITALS — BP 128/90 | HR 72 | Temp 97.0°F | Resp 14 | Ht 66.0 in | Wt 183.8 lb

## 2013-07-10 DIAGNOSIS — E876 Hypokalemia: Secondary | ICD-10-CM

## 2013-07-10 DIAGNOSIS — R1011 Right upper quadrant pain: Secondary | ICD-10-CM

## 2013-07-10 NOTE — Telephone Encounter (Signed)
New message  Patient is lisinipril for a week for her BP. She is still having headaches and dizziness. She would like a different medication. Please call and advise.

## 2013-07-10 NOTE — Progress Notes (Signed)
NAME: Olivia [name]       DOB: 24-Jul-1966           DATE: 07/10/2013       MRN: [id number]   Olivia [name] is a 47 y.o.Marland Kitchenfemale who presents for routine followup of her  stage 1 right breast cancerdiagnosed in 2012 and treated with breast conservation therapy ,  Radiation and chemotherapy. She has no problems or concerns on either side. Seen in ED for pain below right breast and CT angio done to exclude PE.  Hematoma noted in right breast.  Pain sharp in nature with nausea last month.  No factors make it better or worse.   Has sore breast.  No trauma fever chills or redness.  Pt points to right costal margin as area of pain.   PFSH: She has had no significant changes since the last visit here.  ROS: There have been no significant changes since the last visit here  She does have some confusion since chemotherapy but this is improving.  EXAM: General: The patient is alert, oriented, generally healty appearing, NAD. Mood and affect are normal.  Breasts:  Scaring noted right breast at lumpectomy site right axilla normal.  Right breast at 6 o'clock feels full.  Lower breast lumpectomy site is full.  Hx of cyst at this site but feels more pronounced at this point. Old scar at this site.  Left side normal except for scar. Lymphatics: She has no axillary or supraclavicular adenopathy on either side.  Extremities: Full ROM of the surgical side with no lymphedema noted.  Data Reviewed: CLINICAL DATA: Weakness. Shortness of breath. Chills. Previous  lumpectomy for breast cancer.  EXAM:  CT ANGIOGRAPHY CHEST WITH CONTRAST  TECHNIQUE:  Multidetector CT imaging of the chest was performed using the  standard protocol during bolus administration of intravenous  contrast. Multiplanar CT image reconstructions including MIPs were  obtained to evaluate the vascular anatomy.  CONTRAST: OMNIPAQUE IOHEXOL 350 MG/ML SOLN  COMPARISON: Chest radiographs obtained yesterday and chest CT dated  06/05/2012.   FINDINGS:  Normally opacified pulmonary arteries with no pulmonary arterial  filling defects seen. Clear lungs. No lung nodules or enlarged lymph  nodes. 1.4 x 1.2 cm oval low density mass with peripheral coarse  calcification in the subareolar region of the right breast,  compatible with fat necrosis. There is also a mass in the posterior  aspect of the inferior right breast, in the 6 o'clock position,  measuring 2.7 x 2.1 cm in maximum dimensions on image number 57.  There is a similar density at this location on 06/05/2012, measuring  2.4 x 2.0 cm at that time. This corresponds to the location of a  larger postoperative fluid collection on 07/28/2009. Mild thoracic  spine degenerative changes.  Review of the MIP images confirms the above findings.  IMPRESSION:  1. No pulmonary emboli or acute abnormality.  2. Right breast postsurgical changes with fat necrosis and organized  Hematoma. Clinical Data: Status post ultrasound guided core biopsy of right  breast mass.  DIGITAL DIAGNOSTIC RIGHT MAMMOGRAM  Comparison: Previous exams.  Findings: Films are performed following ultrasound guided biopsy  of mass in the 6:12 location of the right breast. The T shaped  clip is identified in the lower central portion of the right  breast, just anterior to an air-filled seroma cavity.  IMPRESSION:  Tissue marker clip in expected location following biopsy.         Clinical Data: Palpable lump right breast six o'clock  DIGITAL DIAGNOSTIC RIGHT MAMMOGRAM WITH CAD AND RIGHT BREAST  ULTRASOUND:  Comparison: September 03, 2012, August 30, 2011, Nov 29, 2009  Findings:  ACR Breast Density Category scattered fibroglandular tissue  CC and MLO views of the right breast, exaggerated right CC view,  spot tangential view of the right breast are submitted. Stable  postsurgical changes are identified in the upper outer right  breast. There is a mass in the right breast six o'clock position  that is  smaller but denser compared to prior mammogram of Nov 29, 2009.  Mammographic images were processed with CAD.  Ultrasound is performed, showing mild irregular marginated mild  heterogeneous hypoechoic lesion measuring 2.2 x 1.8 x 2.4 cm at the  right breast six o'clock 2 cm from nipple palpable area correlating  to the mammographic mass.  IMPRESSION:  Suspicious findings.  RECOMMENDATION:  Ultrasound guided core biopsy right breast six o'clock mass.  I have discussed the findings and recommendations with the patient.  Results were also provided in writing at the conclusion of the  visit. If applicable, a reminder letter will be sent to the  patient regarding the next appointment.  BI-RADS CATEGORY 4: Suspicious abnormality - biopsy shou Breast, right, needle core biopsy, 6 o'clock - BENIGN BREAST TISSUE WITH FIBROSIS AND INFLAMMATION, SEE COMMENT. - NEGATIVE FOR ATYPIA OR MALIGNANCY. Microscopic Comment The history of primary mammary carcinoma is noted ([id number]).    Impression: History of right breast cancer History of seroma Right upper quadrant abdominal pain   Plan: Recommend U/S to exclude gallstones.  Breast changes are expected and had core biopsy this summer that was benign.  She has a seroma which  Is small and chronic and can be followed.  Return in 6 months or sooner depending on U/S.

## 2013-07-13 ENCOUNTER — Telehealth (INDEPENDENT_AMBULATORY_CARE_PROVIDER_SITE_OTHER): Payer: Self-pay | Admitting: *Deleted

## 2013-07-13 NOTE — Telephone Encounter (Signed)
I spoke with pt and informed her of the appt for her [place] at [id number] on 12/17 with an arrival time of 7:45am.  Pt stated this time would not work for her so I provided their phone number for pt to call and reschedule.  Pt is agreeable with this plan.

## 2013-07-14 NOTE — Telephone Encounter (Addendum)
Spoke with this patient and she has advised me that Dr.Ross was going to set her up for a cardiac MRI on 12/19 but did not hear back on this. Advised that I will send her note about this and we will get back to her ASAP. I noticed a message from 12/12 about headaches and dizziness with no return call. Discussed this with the patient and she states that she is now feeling better without any headaches or dizziness. Tolerating Lisinopril 10mg  every day. Patient states that BP last checked on 12/15 was 128/86. Will forward note to Dr.Ross.

## 2013-07-14 NOTE — Telephone Encounter (Signed)
Follow up    Pt calling wanting to know when she is to have her MRI??    Please give pt a call back.

## 2013-07-14 NOTE — Telephone Encounter (Signed)
Left message for call back.

## 2013-07-15 ENCOUNTER — Other Ambulatory Visit: Payer: PRIVATE HEALTH INSURANCE

## 2013-07-15 ENCOUNTER — Other Ambulatory Visit: Payer: Self-pay | Admitting: *Deleted

## 2013-07-15 DIAGNOSIS — I5189 Other ill-defined heart diseases: Secondary | ICD-10-CM

## 2013-07-15 NOTE — Telephone Encounter (Signed)
Order placed for MRI and appt scheduled.

## 2013-07-16 ENCOUNTER — Ambulatory Visit (HOSPITAL_COMMUNITY): Admission: RE | Admit: 2013-07-16 | Payer: PRIVATE HEALTH INSURANCE | Source: Ambulatory Visit

## 2013-07-16 ENCOUNTER — Ambulatory Visit
Admission: RE | Admit: 2013-07-16 | Discharge: 2013-07-16 | Disposition: A | Discharge status: Home / Self Care | Payer: PRIVATE HEALTH INSURANCE | Source: Ambulatory Visit | Attending: Surgery | Admitting: Surgery

## 2013-07-16 DIAGNOSIS — R1011 Right upper quadrant pain: Secondary | ICD-10-CM

## 2013-07-17 ENCOUNTER — Ambulatory Visit (HOSPITAL_COMMUNITY)
Admission: RE | Admit: 2013-07-17 | Discharge: 2013-07-17 | Disposition: A | Discharge status: Home / Self Care | Payer: PRIVATE HEALTH INSURANCE | Source: Ambulatory Visit | Attending: Internal Medicine | Admitting: Internal Medicine

## 2013-07-17 ENCOUNTER — Telehealth (INDEPENDENT_AMBULATORY_CARE_PROVIDER_SITE_OTHER): Payer: Self-pay

## 2013-07-17 DIAGNOSIS — I5189 Other ill-defined heart diseases: Secondary | ICD-10-CM | POA: Insufficient documentation

## 2013-07-17 MED ORDER — GADOBENATE DIMEGLUMINE 529 MG/ML IV SOLN
28.0000 mL | Freq: Once | INTRAVENOUS | Status: AC | PRN
  Administered 2013-07-17: 28 mL via INTRAVENOUS

## 2013-07-17 NOTE — Telephone Encounter (Signed)
LMOM> [place] was negative. No gallstones

## 2013-07-17 NOTE — Telephone Encounter (Signed)
Message copied by Brennan Bailey on Fri Jul 17, 2013 11:41 AM ------      Message from: Harriette Bouillon A      Created: Thu Jul 16, 2013 10:29 PM       Normal U/S ------

## 2013-07-17 NOTE — Telephone Encounter (Signed)
Patient called back at this time and was given below message.

## 2013-08-10 ENCOUNTER — Ambulatory Visit (INDEPENDENT_AMBULATORY_CARE_PROVIDER_SITE_OTHER): Payer: PRIVATE HEALTH INSURANCE | Admitting: Internal Medicine

## 2013-08-10 ENCOUNTER — Encounter: Payer: Self-pay | Admitting: Internal Medicine

## 2013-08-10 ENCOUNTER — Other Ambulatory Visit: Payer: Self-pay | Admitting: Medical

## 2013-08-10 VITALS — BP 142/102 | HR 78 | Ht 66.0 in | Wt 184.0 lb

## 2013-08-10 DIAGNOSIS — I10 Essential (primary) hypertension: Secondary | ICD-10-CM | POA: Diagnosis not present

## 2013-08-10 DIAGNOSIS — R079 Chest pain, unspecified: Secondary | ICD-10-CM

## 2013-08-10 DIAGNOSIS — C50919 Malignant neoplasm of unspecified site of unspecified female breast: Secondary | ICD-10-CM | POA: Diagnosis not present

## 2013-08-10 DIAGNOSIS — R0602 Shortness of breath: Secondary | ICD-10-CM

## 2013-08-10 DIAGNOSIS — Z79899 Other long term (current) drug therapy: Secondary | ICD-10-CM

## 2013-08-10 LAB — CBC WITH DIFFERENTIAL/PLATELET
Basophils Absolute: 0 10*3/uL (ref 0.0–0.1)
Basophils Relative: 0 % (ref 0–1)
Eosinophils Absolute: 0.1 10*3/uL (ref 0.0–0.7)
Eosinophils Relative: 1 % (ref 0–5)
HCT: 39.8 % (ref 36.0–46.0)
Hemoglobin: 13.9 g/dL (ref 12.0–15.0)
LYMPHS ABS: 2.4 10*3/uL (ref 0.7–4.0)
Lymphocytes Relative: 42 % (ref 12–46)
MCH: 31.2 pg (ref 26.0–34.0)
MCHC: 34.9 g/dL (ref 30.0–36.0)
MCV: 89.4 fL (ref 78.0–100.0)
Monocytes Absolute: 0.3 10*3/uL (ref 0.1–1.0)
Monocytes Relative: 5 % (ref 3–12)
NEUTROS PCT: 52 % (ref 43–77)
Neutro Abs: 2.9 10*3/uL (ref 1.7–7.7)
Platelets: 292 10*3/uL (ref 150–400)
RBC: 4.45 MIL/uL (ref 3.87–5.11)
RDW: 13.4 % (ref 11.5–15.5)
WBC: 5.6 10*3/uL (ref 4.0–10.5)

## 2013-08-10 MED ORDER — ASPIRIN EC 81 MG PO TBEC: 81.0000 mg | DELAYED_RELEASE_TABLET | Freq: Every day | ORAL | Status: DC

## 2013-08-10 MED ORDER — DILTIAZEM HCL ER COATED BEADS 180 MG PO CP24: 180.0000 mg | ORAL_CAPSULE | Freq: Every day | ORAL | Status: DC

## 2013-08-10 NOTE — Progress Notes (Signed)
HPI Patient is a 48 yo who I saw back in early December for evaluation of abnormal echo.  The patient also had uncontrolled HTN  I added amlodipine to her regimen Since seen she has had a cardiac MRI  This showed the RA mass along the lateral wall  (1.9 x 1.6 mm, calcified)  COnsistent with thrombus  LVEF 54%  The patient continues to complain of SOB with activity.  Feels it has been going on for a long time.    Allergies  Allergen Reactions  . Adhesive [Tape] Hives  . Morphine Hives    Current Outpatient Prescriptions  Medication Sig Dispense Refill  . BIOTIN PO Take 1 tablet by mouth daily.      . Calcium Carbonate-Vitamin D (CALCIUM + D) 600-200 MG-UNIT TABS Take 2 tablets by mouth daily.        . cetirizine (ZYRTEC) 10 MG tablet Take 10 mg by mouth daily.      . cyclobenzaprine (FLEXERIL) 10 MG tablet Take 10 mg by mouth 3 (three) times daily as needed.        [name] esomeprazole (NEXIUM) 40 MG capsule Take 40 mg by mouth daily before breakfast.      . furosemide (LASIX) 40 MG tablet Take 1 tablet (40 mg total) by mouth every other day.  30 tablet    . gabapentin (NEURONTIN) 600 MG tablet Take 600 mg by mouth at bedtime.      [name] lisinopril (PRINIVIL,ZESTRIL) 10 MG tablet Take 1/2 tablet daily      . Multiple Vitamins-Minerals (MULTIVITAMIN WITH MINERALS) tablet Take 1 tablet by mouth daily.        . nabumetone (RELAFEN) 500 MG tablet Take 500 mg by mouth 2 (two) times daily.        [name] OVER THE COUNTER MEDICATION       . oxyCODONE (OXYCONTIN) 20 MG 12 hr tablet Take 20 mg by mouth 3 (three) times daily.       . potassium chloride SA (K-DUR,KLOR-CON) 20 MEQ tablet Take 20 mEq by mouth every other day.        No current facility-administered medications for this visit.    Past Medical History  Diagnosis Date  . Colon polyp   . Obesity   . Uterine fibroid     s/p embolization  . Lumbar degenerative disc disease   . History of breast cancer 2010    invasive ductal R breast; s/p  lumpectomy, radiation and chemo  . Obesity   . Breast cancer, stage 1   . GERD (gastroesophageal reflux disease)     Past Surgical History  Procedure Laterality Date  . Breast lumpectomy  2010    right  . Tubal ligation  2010  . Colonoscopy  2010  . Power port    . Lumbar laminectomy/decompression microdiscectomy  2007, 2008    Dr. Hal Neer x 2  . Uterine fibroid embolization  2008 or 2009    Family History  Problem Relation Age of Onset  . Breast cancer Mother   . Cancer Maternal Grandmother     renal cancer    History   Social History  . Marital Status: Married    Spouse Name: N/A    Number of Children: N/A  . Years of Education: N/A   Occupational History  . Not on file.   Social History Main Topics  . Smoking status: Never Smoker   . Smokeless tobacco: Never Used  . Alcohol Use: No  .  Drug Use: No  . Sexual Activity: Yes   Other Topics Concern  . Not on file   Social History Narrative  . No narrative on file    Review of Systems:  All systems reviewed.  They are negative to the above problem except as previously stated.  Vital Signs: BP 142/102  Pulse 78  Ht 5\' 6"  (1.676 m)  Wt 184 lb (83.462 kg)  BMI 29.71 kg/m2  SpO2 98%  Physical Exam Patient is in NAD HEENT:  Normocephalic, atraumatic. EOMI, PERRLA.  Neck: JVP is normal.  No bruits.  Lungs: clear to auscultation. No rales no wheezes.  Heart: Regular rate and rhythm. Normal S1, S2. No S3.   No significant murmurs. PMI not displaced.  Abdomen:  Supple, nontender. Normal bowel sounds. No masses. No hepatomegaly.  Extremities:   Good distal pulses throughout. No lower extremity edema.  Musculoskeletal :moving all extremities.  Neuro:   alert and oriented x3.  CN II-XII grossly intact.   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  This was before her chemotherapy in 2010 (when she had several ports.   I am  not sure what lead to formation so early  No other evid for hypercoagulability. But, I would recomm hypercoagulable panel  Will review with oncology as well  Aspirin for now.    2.  HTN  Patient on lisinopril  BP is still high  After reviewing echo (with evid of moderate diastolic dysfunction) I would recomm adding Diltiazem 180 mg to regimen  Keep on lisinopril for now.    3.    Hx of breast CA

## 2013-08-10 NOTE — Patient Instructions (Addendum)
Please start Diltaizem CD 180 mg a day. Continue all other medications as listed.  Please have blood work today.  Follow up will be determined by test results.

## 2013-08-11 LAB — LUPUS ANTICOAGULANT PANEL
DRVVT: 28.1 s (ref <42.9)
LUPUS ANTICOAGULANT: NOT DETECTED
PTT Lupus Anticoagulant: 30.8 secs (ref 28.0–43.0)

## 2013-08-11 LAB — BASIC METABOLIC PANEL
BUN: 11 mg/dL (ref 6–23)
CHLORIDE: 98 meq/L (ref 96–112)
CO2: 28 mEq/L (ref 19–32)
CREATININE: 0.73 mg/dL (ref 0.50–1.10)
Calcium: 9.5 mg/dL (ref 8.4–10.5)
Glucose, Bld: 76 mg/dL (ref 70–99)
Potassium: 4.1 mEq/L (ref 3.5–5.3)
SODIUM: 136 meq/L (ref 135–145)

## 2013-08-11 LAB — ANTITHROMBIN III: ANTITHROMB III FUNC: 117 % (ref 76–126)

## 2013-08-11 LAB — PROTEIN C ACTIVITY: Protein C Activity: 200 % — ABNORMAL HIGH (ref 75–133)

## 2013-08-11 LAB — PROTEIN S ACTIVITY: Protein S Activity: 122 % (ref 69–129)

## 2013-08-12 LAB — PROTEIN ELECTROPHORESIS, SERUM, WITH REFLEX
ALBUMIN ELP: 56.2 % (ref 55.8–66.1)
Alpha-1-Globulin: 7.3 % — ABNORMAL HIGH (ref 2.9–4.9)
Alpha-2-Globulin: 10.4 % (ref 7.1–11.8)
BETA 2: 4.9 % (ref 3.2–6.5)
Beta Globulin: 5.6 % (ref 4.7–7.2)
Gamma Globulin: 15.6 % (ref 11.1–18.8)
Total Protein, Serum Electrophoresis: 7.6 g/dL (ref 6.0–8.3)

## 2013-08-12 LAB — PROTEIN C, TOTAL: Protein C, Total: 119 % (ref 72–160)

## 2013-08-12 LAB — FACTOR 5 LEIDEN

## 2013-08-12 LAB — PROTEIN S, TOTAL: Protein S Total: 117 % (ref 60–150)

## 2013-08-13 LAB — VON WILLEBRAND PANEL
COAGULATION FACTOR VIII: 138 % (ref 73–140)
RISTOCETIN CO-FACTOR, PLASMA: 85 % (ref 42–200)
Von Willebrand Antigen, Plasma: 152 % (ref 50–217)

## 2013-08-18 ENCOUNTER — Ambulatory Visit (INDEPENDENT_AMBULATORY_CARE_PROVIDER_SITE_OTHER): Payer: PRIVATE HEALTH INSURANCE | Admitting: Medical

## 2013-08-18 ENCOUNTER — Encounter: Payer: Self-pay | Admitting: Medical

## 2013-08-18 VITALS — BP 140/90 | HR 68 | Temp 97.9°F | Resp 16 | Wt 183.0 lb

## 2013-08-18 DIAGNOSIS — R300 Dysuria: Secondary | ICD-10-CM | POA: Diagnosis not present

## 2013-08-18 DIAGNOSIS — IMO0001 Reserved for inherently not codable concepts without codable children: Secondary | ICD-10-CM

## 2013-08-18 DIAGNOSIS — R350 Frequency of micturition: Secondary | ICD-10-CM | POA: Diagnosis not present

## 2013-08-18 LAB — POCT URINALYSIS DIPSTICK
Bilirubin, UA: NEGATIVE
Glucose, UA: NEGATIVE
Ketones, UA: NEGATIVE
Leukocytes, UA: NEGATIVE
Nitrite, UA: NEGATIVE
RBC UA: NEGATIVE
Spec Grav, UA: 1.01
UROBILINOGEN UA: NEGATIVE
pH, UA: 5

## 2013-08-18 NOTE — Progress Notes (Signed)
Subjective:  Olivia [name] is a 48 y.o. female who complains of possible urinary tract infection.  She has had symptoms for 1 week.  Symptoms include back pain and pelvic pressure, urinary urgency and frequency.  Patient denies fever and vaginal discharge.  Last UTI was 04/2013.  Using nothing for current symptoms.  Patient does have a history of pyelonephritis, 04/2013.  When she was seen back in the fall, she was referred to urology for recurrent symptoms.   She saw the urologist, Dr. Sharlett Iles for consult but has not yet went back for the testing that was advised.  She does use a feminine spray, drinks plenty of water, but no other changes at would put her at risk for urinary tract infection.   No concern for STD, no vaginal symptoms. No other aggravating or relieving factors.  No other c/o.  Past Medical History  Diagnosis Date  . Colon polyp   . Obesity   . Uterine fibroid     s/p embolization  . Lumbar degenerative disc disease   . History of breast cancer 2010    invasive ductal R breast; s/p lumpectomy, radiation and chemo  . Obesity   . Breast cancer, stage 1   . GERD (gastroesophageal reflux disease)     ROS as in subjective  Reviewed allergies, medications, past medical, surgical, and social history.    Objective: Filed Vitals:   08/18/13 1202  BP: 140/90  Pulse: 68  Temp: 97.9 F (36.6 C)  Resp: 16    General appearance: alert, no distress, WD/WN, female Abdomen: +bs, soft, non tender, non distended, no masses, no hepatomegaly, no splenomegaly, no bruits Back: no CVA tenderness GU: deferred     Assessment: Encounter Diagnoses  Name Primary?  . Dysuria Yes  . Frequency      Plan: Discussed symptom.  Urine culture sent.  At this point the urine is not strongly suggestive of urinary tract infection.  We will send urine culture today, advise she follow up with urologist for the additional testing that was planned.  If symptoms worsen the next 48 hours then call  back. Continue good hydration, avoid any scented feminine sprays or new body washes, avoid bubble baths, urinate after intercourse.

## 2013-08-20 ENCOUNTER — Other Ambulatory Visit: Payer: Self-pay | Admitting: *Deleted

## 2013-08-20 ENCOUNTER — Other Ambulatory Visit: Payer: Self-pay | Admitting: Medical

## 2013-08-20 MED ORDER — LISINOPRIL 5 MG PO TABS
5.0000 mg | ORAL_TABLET | Freq: Every day | ORAL | Status: DC
Start: 2013-08-20 — End: 2013-10-14

## 2013-08-20 MED ORDER — CIPROFLOXACIN HCL 500 MG PO TABS: 500.0000 mg | ORAL_TABLET | Freq: Two times a day (BID) | ORAL | Status: DC

## 2013-08-20 NOTE — Telephone Encounter (Signed)
Patient requests a 5mg  tablet instead of having to cut a 10mg  tab in half.

## 2013-08-22 LAB — URINE CULTURE: Colony Count: >=100000

## 2013-08-28 ENCOUNTER — Other Ambulatory Visit: Payer: Self-pay | Admitting: Medical

## 2013-08-28 ENCOUNTER — Telehealth: Payer: Self-pay | Admitting: *Deleted

## 2013-08-28 MED ORDER — RIVAROXABAN 20 MG PO TABS: 20.0000 mg | ORAL_TABLET | Freq: Every day | ORAL | Status: DC

## 2013-08-28 NOTE — Telephone Encounter (Signed)
Per dr Harrington Challenger, Left msg on VM Blood work done eariler this month is negative (Pt not hypercoagulable)  I would recomm starting Xarelto 1x per day Stop aspirin.  Clot appears more prominently now than shadows seen in echo from 2006  Would recomm repeat echo in 6 months to see if smaller. May go away.   Spoke with pt, script sent to the pharm. Patient voiced understanding of med changes.

## 2013-09-21 ENCOUNTER — Telehealth: Payer: Self-pay | Admitting: Internal Medicine

## 2013-09-21 DIAGNOSIS — Z79899 Other long term (current) drug therapy: Secondary | ICD-10-CM | POA: Diagnosis not present

## 2013-09-21 DIAGNOSIS — M5126 Other intervertebral disc displacement, lumbar region: Secondary | ICD-10-CM | POA: Diagnosis not present

## 2013-09-21 DIAGNOSIS — M5412 Radiculopathy, cervical region: Secondary | ICD-10-CM | POA: Diagnosis not present

## 2013-09-21 DIAGNOSIS — M5020 Other cervical disc displacement, unspecified cervical region: Secondary | ICD-10-CM | POA: Diagnosis not present

## 2013-09-21 NOTE — Telephone Encounter (Signed)
Returned call to patient no answer.LMTC. 

## 2013-09-21 NOTE — Telephone Encounter (Signed)
New message    Patient calling C/O side effect from medication - xarelto . Legs are dry , crack , more like elephant skill .

## 2013-09-22 NOTE — Telephone Encounter (Signed)
Pt states she started Xarelto last week for the first time. About 3 days later she developed dry cracked rough skin on both legs. She did not have a rash or problems on any other areas of her skin. She has been off of it since Friday and her symptoms are resolving. She is not willing to try it again and would like another medication instead. I will forward to Dr Harrington Challenger.

## 2013-09-23 NOTE — Telephone Encounter (Signed)
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

## 2013-09-25 MED ORDER — APIXABAN 5 MG PO TABS: 5.0000 mg | ORAL_TABLET | Freq: Two times a day (BID) | ORAL | Status: DC

## 2013-09-25 NOTE — Telephone Encounter (Signed)
Follow Up   Pt calling to follow Up on a new RX from Xarelto// Please call back to discuss

## 2013-09-25 NOTE — Telephone Encounter (Signed)
Spoke with pt, aware discussed with dr Harrington Challenger, pt will change to eliquis 5 mg bid. Pt will call with any problems.

## 2013-10-01 ENCOUNTER — Other Ambulatory Visit: Payer: Self-pay | Admitting: Obstetrics and Gynecology

## 2013-10-01 DIAGNOSIS — N6310 Unspecified lump in the right breast, unspecified quadrant: Secondary | ICD-10-CM

## 2013-10-01 DIAGNOSIS — Z853 Personal history of malignant neoplasm of breast: Secondary | ICD-10-CM

## 2013-10-01 DIAGNOSIS — Z9889 Other specified postprocedural states: Secondary | ICD-10-CM

## 2013-10-08 DIAGNOSIS — M6281 Muscle weakness (generalized): Secondary | ICD-10-CM | POA: Diagnosis not present

## 2013-10-08 DIAGNOSIS — M5126 Other intervertebral disc displacement, lumbar region: Secondary | ICD-10-CM | POA: Diagnosis not present

## 2013-10-08 DIAGNOSIS — M545 Low back pain, unspecified: Secondary | ICD-10-CM | POA: Diagnosis not present

## 2013-10-13 DIAGNOSIS — M545 Low back pain, unspecified: Secondary | ICD-10-CM | POA: Diagnosis not present

## 2013-10-13 DIAGNOSIS — M5126 Other intervertebral disc displacement, lumbar region: Secondary | ICD-10-CM | POA: Diagnosis not present

## 2013-10-13 DIAGNOSIS — M6281 Muscle weakness (generalized): Secondary | ICD-10-CM | POA: Diagnosis not present

## 2013-10-14 ENCOUNTER — Encounter: Payer: Self-pay | Admitting: Cardiology

## 2013-10-14 ENCOUNTER — Ambulatory Visit (INDEPENDENT_AMBULATORY_CARE_PROVIDER_SITE_OTHER): Payer: PRIVATE HEALTH INSURANCE | Admitting: Cardiology

## 2013-10-14 VITALS — BP 138/98 | HR 87 | Ht 66.0 in | Wt 190.2 lb

## 2013-10-14 DIAGNOSIS — E7800 Pure hypercholesterolemia, unspecified: Secondary | ICD-10-CM

## 2013-10-14 DIAGNOSIS — I10 Essential (primary) hypertension: Secondary | ICD-10-CM

## 2013-10-14 DIAGNOSIS — R002 Palpitations: Secondary | ICD-10-CM

## 2013-10-14 DIAGNOSIS — I513 Intracardiac thrombosis, not elsewhere classified: Secondary | ICD-10-CM

## 2013-10-14 DIAGNOSIS — R0602 Shortness of breath: Secondary | ICD-10-CM | POA: Diagnosis not present

## 2013-10-14 DIAGNOSIS — I5189 Other ill-defined heart diseases: Secondary | ICD-10-CM

## 2013-10-14 MED ORDER — LISINOPRIL 5 MG PO TABS: 5.0000 mg | ORAL_TABLET | Freq: Two times a day (BID) | ORAL | Status: DC

## 2013-10-14 NOTE — Patient Instructions (Addendum)
Please increase Lisinopril to 5 mg twice a day ,if after a month  You feel okay with this dose you may take both at the same time (change to 10 mg if needed)  As your discussion with Dr Ellyn Hack, your heart does not relax easily , so when your heart rate increase you become short of breath.  Your physician discussed the importance of regular exercise and recommended that you start or continue a regular exercise program for good health. Increase gradually increase walking 10 min increments.  Your physician wants you to follow-up in 3 months  Dr Ellyn Hack. You will receive a reminder letter in the mail two months in advance. If you don't receive a letter, please call our office to schedule the follow-up appointment.

## 2013-10-15 DIAGNOSIS — M6281 Muscle weakness (generalized): Secondary | ICD-10-CM | POA: Diagnosis not present

## 2013-10-15 DIAGNOSIS — M5126 Other intervertebral disc displacement, lumbar region: Secondary | ICD-10-CM | POA: Diagnosis not present

## 2013-10-15 DIAGNOSIS — M545 Low back pain, unspecified: Secondary | ICD-10-CM | POA: Diagnosis not present

## 2013-10-16 ENCOUNTER — Encounter: Payer: Self-pay | Admitting: Cardiology

## 2013-10-16 DIAGNOSIS — R002 Palpitations: Secondary | ICD-10-CM | POA: Insufficient documentation

## 2013-10-16 DIAGNOSIS — R0602 Shortness of breath: Secondary | ICD-10-CM | POA: Insufficient documentation

## 2013-10-16 DIAGNOSIS — I513 Intracardiac thrombosis, not elsewhere classified: Secondary | ICD-10-CM | POA: Insufficient documentation

## 2013-10-16 NOTE — Assessment & Plan Note (Signed)
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  We talked about vagal maneuvers. They're somewhat improved with diltiazem, we may need to increase that dose and followup.

## 2013-10-16 NOTE — Assessment & Plan Note (Signed)
Probably multifactorial including some complement of deconditioning. Also probably has to do with the grade 2 diastolic function has been present since 2006. This would explain why she is short of breath with activity palpitations as well. Mainstay of treatment is afterload reduction/blood pressure control as well as exercise regimen. I discussed a brief beginnings an exercise regimen with her. We discussed the importance of dietary modification as well.[name]

## 2013-10-16 NOTE — Progress Notes (Signed)
PATIENT: Olivia [name] MRN: [id number]  DOB: 1965/10/31   DOV:10/16/2013 PCP: [name], MD  Clinic Note: Chief Complaint  Patient presents with  . New Evaluation    2 nd opioion card. ,episode in NOV 2014 - HEART RACING ,AND admitted to hospital - saw  CARDIOLOGIST   , no chest pain , no sob, edema  RGT breast cancer in 2010  had chemo    HPI: Olivia [name] is a 48 y.o.  female with a PMH below who presents today for a second opinion for heart palpitations and right atrial thrombus. She started seeing Dr. Harrington Challenger back in December to followup an abnormal echocardiogram which showed a right atrial mass along the lateral wall. It seemed somewhat calcified and fixed. Her EF was essentially normal of 78% with diastolic dysfunction. She also has a history of pseudomonal dysfunction from many years ago back in 2001 she was getting chemotherapy for breast cancer. She has been on Eliquis for the right atrial thrombus.   Interval History:  she continues to have intermittent episodes of tachycardia palpitations or heart will go fast for several minutes a time this is associated with discomfort in her chest and dyspnea. She does get some exertional dyspnea as well. No real exertional chest discomfort however. The tachycardia palpitations did not make her feel lightheaded or dizzy. No syncope or near-syncope. No TIA or amaurosis fugax symptoms. She also denies any PND, orthopnea but has intermittent edema that is mild.   No TIA or RCA symptoms. No melena, hematochezia or hematuria  Past Medical History  Diagnosis Date  . Colon polyp   . Obesity   . Uterine fibroid     s/p embolization  . Lumbar degenerative disc disease   . History of breast cancer 2010    invasive ductal R breast; s/p lumpectomy, radiation and chemo  . Obesity   . Breast cancer, stage 1   . GERD (gastroesophageal reflux disease)     Prior Cardiac Evaluation and Past Surgical History: Past Surgical History  Procedure Laterality  Date  . Breast lumpectomy  2010    right  . Tubal ligation  2010  . Colonoscopy  2010  . Power port    . Lumbar laminectomy/decompression microdiscectomy  2007, 2008    Dr. Hal Neer x 2  . Uterine fibroid embolization  2008 or 2009  . Transthoracic echocardiogram   November 2014    Mild LVH, EF of roughly 50%. Pseudo-normal LV filling (grade 3 diastolic soft. Mild LA dilation. 1.3 x 1.3 cm mass in right atrium.  . Cardiac mri  December 2014    1.9 x 1.6 cm mass in the lateral RA cavity. -- Most suggestive of thrombus--  calcified anndd  laammiinnaated suggestive of chronic.; EF from 50-4% with no scar. Trivial effusion    Allergies  Allergen Reactions  . Adhesive [Tape] Hives  . Morphine Hives    Current Outpatient Prescriptions  Medication Sig Dispense Refill  . apixaban (ELIQUIS) 5 MG TABS tablet Take 1 tablet (5 mg total) by mouth 2 (two) times daily.  60 tablet  6  . BIOTIN PO Take 1 tablet by mouth daily.      . Calcium Carbonate-Vitamin D (CALCIUM + D) 600-200 MG-UNIT TABS Take 2 tablets by mouth daily.        . cetirizine (ZYRTEC) 10 MG tablet Take 10 mg by mouth daily.      . cyclobenzaprine (FLEXERIL) 10 MG tablet Take 10 mg by mouth 3 (three)  times daily as needed.        . diltiazem (CARDIZEM CD) 180 MG 24 hr capsule Take 1 capsule (180 mg total) by mouth daily.  30 capsule  11  . furosemide (LASIX) 40 MG tablet Take 1 tablet (40 mg total) by mouth every other day.  30 tablet    . gabapentin (NEURONTIN) 600 MG tablet Take 600 mg by mouth at bedtime.      . Multiple Vitamins-Minerals (MULTIVITAMIN WITH MINERALS) tablet Take 1 tablet by mouth daily.        . nabumetone (RELAFEN) 500 MG tablet Take 500 mg by mouth 2 (two) times daily.        [name] NEXIUM 40 MG capsule TAKE 1 CAPSULE BY MOUTH ONCE A DAY  30 capsule  2  . OVER THE COUNTER MEDICATION       . potassium chloride SA (K-DUR,KLOR-CON) 20 MEQ tablet Take 20 mEq by mouth every other day.       . traMADol (ULTRAM) 50 MG  tablet Take 50 mg by mouth. Take 1 to 2 tablet every  6 hours  prn      . lisinopril (PRINIVIL,ZESTRIL) 5 MG tablet Take 1 tablet (5 mg total) by mouth 2 (two) times daily.  60 tablet  3   No current facility-administered medications for this visit.    History   Social History Narrative   She is married. Never smoked.   ROS: A comprehensive Review of Systems - Negative except Symptoms noted above and mild muscular skeletal exam pains  PHYSICAL EXAM BP 138/98  Pulse 87  Ht 5\' 6"  (1.676 m)  Wt 190 lb 3.2 oz (86.274 kg)  BMI 30.71 kg/m2 General appearance: alert, cooperative, appears stated age, no distress, mildly obese and Healthy-appearing. Answered questions appropriately. HEENT: [place]/AT, EOMI, MMM, anicteric sclera Neck: no adenopathy, no carotid bruit, no JVD and supple, symmetrical, trachea midline Lungs: clear to auscultation bilaterally, normal percussion bilaterally and Nonlabored; good air movement Heart: RRR, normal S1 and S2 with a soft S4 gallop. There is a soft systolic murmur heard right along the precordium. Nondisplaced PMI  Abdomen: soft, non-tender; bowel sounds normal; no masses,  no organomegaly Extremities: No clubbing or cyanosis. Trace edema Pulses: 2+ and symmetric Neurologic: Alert and oriented X 3, normal strength and tone. Normal symmetric reflexes. Normal coordination and gait   Adult ECG Report  Rate:  87 ;  Rhythm: normal sinus rhythm  Narrative Interpretation:  normal sinus rhythm with nonspecific ST-T changes. Prolonged QT as listed that is not correct   Recent Labs:  no new labs   ASSESSMENT / PLAN: No problem-specific assessment & plan notes found for this encounter.   Orders Placed This Encounter  Procedures  . EKG 12-Lead    Order Specific Question:  Where should this test be performed    Answer:  OTHER   Followup:  3 months   DAVID W. Ellyn Hack, M.D., M.S. Interventional Cardiology [facility]

## 2013-10-16 NOTE — Assessment & Plan Note (Signed)
Monitored by PCP. Not on current regimen.

## 2013-10-16 NOTE — Assessment & Plan Note (Signed)
Blood pressure is little higher now but PDA with elevated diastolic pressure. Increase lisinopril 10 mg daily.

## 2013-10-17 ENCOUNTER — Encounter: Payer: Self-pay | Admitting: Cardiology

## 2013-10-17 NOTE — Assessment & Plan Note (Signed)
My understanding is that she is on Eliquis because of this. She did not have a positive hypercoagulable workup. The most likely cause of this thrombus was a long standing indwelling catheter for chemotherapy during her cancer treatments and 2010. He has a chronic appearance, and is unlikely to have any major consequence. She is not exactly sure why she was placed on Eliquis, all other review the chart to find this out.

## 2013-10-20 DIAGNOSIS — M545 Low back pain, unspecified: Secondary | ICD-10-CM | POA: Diagnosis not present

## 2013-10-20 DIAGNOSIS — M6281 Muscle weakness (generalized): Secondary | ICD-10-CM | POA: Diagnosis not present

## 2013-10-20 DIAGNOSIS — M5126 Other intervertebral disc displacement, lumbar region: Secondary | ICD-10-CM | POA: Diagnosis not present

## 2013-10-21 ENCOUNTER — Ambulatory Visit
Admission: RE | Admit: 2013-10-21 | Discharge: 2013-10-21 | Disposition: A | Discharge status: Home / Self Care | Payer: PRIVATE HEALTH INSURANCE | Source: Ambulatory Visit | Attending: Obstetrics and Gynecology | Admitting: Obstetrics and Gynecology

## 2013-10-21 DIAGNOSIS — Z853 Personal history of malignant neoplasm of breast: Secondary | ICD-10-CM | POA: Diagnosis not present

## 2013-10-21 DIAGNOSIS — Z9889 Other specified postprocedural states: Secondary | ICD-10-CM

## 2013-10-21 DIAGNOSIS — N6310 Unspecified lump in the right breast, unspecified quadrant: Secondary | ICD-10-CM

## 2013-10-21 DIAGNOSIS — R922 Inconclusive mammogram: Secondary | ICD-10-CM | POA: Diagnosis not present

## 2013-10-22 DIAGNOSIS — M6281 Muscle weakness (generalized): Secondary | ICD-10-CM | POA: Diagnosis not present

## 2013-10-22 DIAGNOSIS — M545 Low back pain, unspecified: Secondary | ICD-10-CM | POA: Diagnosis not present

## 2013-10-22 DIAGNOSIS — M5126 Other intervertebral disc displacement, lumbar region: Secondary | ICD-10-CM | POA: Diagnosis not present

## 2013-10-27 DIAGNOSIS — M6281 Muscle weakness (generalized): Secondary | ICD-10-CM | POA: Diagnosis not present

## 2013-10-27 DIAGNOSIS — M545 Low back pain, unspecified: Secondary | ICD-10-CM | POA: Diagnosis not present

## 2013-10-27 DIAGNOSIS — M5126 Other intervertebral disc displacement, lumbar region: Secondary | ICD-10-CM | POA: Diagnosis not present

## 2013-10-29 DIAGNOSIS — M5126 Other intervertebral disc displacement, lumbar region: Secondary | ICD-10-CM | POA: Diagnosis not present

## 2013-10-29 DIAGNOSIS — M545 Low back pain, unspecified: Secondary | ICD-10-CM | POA: Diagnosis not present

## 2013-10-29 DIAGNOSIS — M6281 Muscle weakness (generalized): Secondary | ICD-10-CM | POA: Diagnosis not present

## 2013-11-03 DIAGNOSIS — M545 Low back pain, unspecified: Secondary | ICD-10-CM | POA: Diagnosis not present

## 2013-11-03 DIAGNOSIS — M5126 Other intervertebral disc displacement, lumbar region: Secondary | ICD-10-CM | POA: Diagnosis not present

## 2013-11-03 DIAGNOSIS — M6281 Muscle weakness (generalized): Secondary | ICD-10-CM | POA: Diagnosis not present

## 2013-11-05 DIAGNOSIS — M545 Low back pain, unspecified: Secondary | ICD-10-CM | POA: Diagnosis not present

## 2013-11-05 DIAGNOSIS — M5126 Other intervertebral disc displacement, lumbar region: Secondary | ICD-10-CM | POA: Diagnosis not present

## 2013-11-05 DIAGNOSIS — M6281 Muscle weakness (generalized): Secondary | ICD-10-CM | POA: Diagnosis not present

## 2013-12-02 ENCOUNTER — Other Ambulatory Visit: Payer: Self-pay | Admitting: *Deleted

## 2013-12-02 DIAGNOSIS — C50919 Malignant neoplasm of unspecified site of unspecified female breast: Secondary | ICD-10-CM

## 2013-12-03 ENCOUNTER — Encounter: Payer: Self-pay | Admitting: Adult Health

## 2013-12-03 ENCOUNTER — Ambulatory Visit (HOSPITAL_BASED_OUTPATIENT_CLINIC_OR_DEPARTMENT_OTHER): Payer: PRIVATE HEALTH INSURANCE | Admitting: Adult Health

## 2013-12-03 ENCOUNTER — Other Ambulatory Visit (HOSPITAL_BASED_OUTPATIENT_CLINIC_OR_DEPARTMENT_OTHER): Payer: PRIVATE HEALTH INSURANCE

## 2013-12-03 VITALS — BP 120/83 | HR 87 | Temp 98.9°F | Resp 18 | Ht 66.0 in | Wt 186.8 lb

## 2013-12-03 DIAGNOSIS — Z853 Personal history of malignant neoplasm of breast: Secondary | ICD-10-CM

## 2013-12-03 DIAGNOSIS — C50919 Malignant neoplasm of unspecified site of unspecified female breast: Secondary | ICD-10-CM

## 2013-12-03 LAB — CBC WITH DIFFERENTIAL/PLATELET
BASO%: 0.4 % (ref 0.0–2.0)
Basophils Absolute: 0 10*3/uL (ref 0.0–0.1)
EOS ABS: 0.1 10*3/uL (ref 0.0–0.5)
EOS%: 1 % (ref 0.0–7.0)
HCT: 39.8 % (ref 34.8–46.6)
HGB: 13.3 g/dL (ref 11.6–15.9)
LYMPH#: 2.5 10*3/uL (ref 0.9–3.3)
LYMPH%: 37.2 % (ref 14.0–49.7)
MCH: 30.9 pg (ref 25.1–34.0)
MCHC: 33.5 g/dL (ref 31.5–36.0)
MCV: 92.3 fL (ref 79.5–101.0)
MONO#: 0.5 10*3/uL (ref 0.1–0.9)
MONO%: 6.9 % (ref 0.0–14.0)
NEUT%: 54.5 % (ref 38.4–76.8)
NEUTROS ABS: 3.7 10*3/uL (ref 1.5–6.5)
Platelets: 287 10*3/uL (ref 145–400)
RBC: 4.31 10*6/uL (ref 3.70–5.45)
RDW: 12.6 % (ref 11.2–14.5)
WBC: 6.8 10*3/uL (ref 3.9–10.3)

## 2013-12-03 LAB — COMPREHENSIVE METABOLIC PANEL (CC13)
ALBUMIN: 3.9 g/dL (ref 3.5–5.0)
ALK PHOS: 85 U/L (ref 40–150)
ALT: 13 U/L (ref 0–55)
AST: 19 U/L (ref 5–34)
Anion Gap: 9 mEq/L (ref 3–11)
BUN: 11.1 mg/dL (ref 7.0–26.0)
CHLORIDE: 105 meq/L (ref 98–109)
CO2: 27 mEq/L (ref 22–29)
Calcium: 9.6 mg/dL (ref 8.4–10.4)
Creatinine: 0.9 mg/dL (ref 0.6–1.1)
Glucose: 98 mg/dl (ref 70–140)
Potassium: 4.2 mEq/L (ref 3.5–5.1)
Sodium: 141 mEq/L (ref 136–145)
Total Bilirubin: 0.35 mg/dL (ref 0.20–1.20)
Total Protein: 7.7 g/dL (ref 6.4–8.3)

## 2013-12-03 NOTE — Progress Notes (Signed)
[place]  [phone] [phone]     ID: Mammie Russian OB: [birth year]  MR#: [id number]  PJK#:[id number]  PCP: [name], MD GYN:  Dr. Garwin Brothers SU: Dr. Erroll Luna OTHER MD: Dr. Wyline Mood oncology  CHIEF COMPLAINT:  48 year old [place] woman with right sided T2 N0, stage IIA, grade III, invasive ductal carcinoma, ER negative, PR negative, Ki-67 77%, HER-2/neu negative.  BREAST CANCER HISTORY:  #1 The patient noted a new palpable mass in her right breast and underwent a diagnostic mammogram in January, 2010.  Biopsy was consistent with invasive ductal carcinoma.  A MRI of the breasts revealed a 2.4 cm right breast mass and a satellite lesion.  At 6 o'clock however, a round oval mass was seen on MRI and a biopsy determined it was not malignant.  Patient underwent a right breast lumpectomy on 09/17/2008 and a 2.3 and 1.7 cm grade III invasive ductal carcinoma was removed.  One sentinel node was negative.  The tumor was ER negative, PR negative, Ki-67 of 77%, HER-2/neu negative.    CURRENT THERAPY: observation  INTERVAL HISTORY:  Patient is a 48 year old woman here for follow up of her h/o breast cancer.  She is doing well today.  She does have some lower back and neck pain from a herniated disk.  She does get some hot flashes occasionally.  Otherwise, she denies fevers, chills, night sweats, hot flashes, or any other concerns.  We reviewed her health maintenance below.    REVIEW OF SYSTEMS:  A 10 point review of systems was conducted and is otherwise negative except for what is noted above.     PAST MEDICAL HISTORY: Past Medical History  Diagnosis Date  . Colon polyp   . Obesity   . Uterine fibroid     s/p embolization  . Lumbar degenerative disc disease   . History of breast cancer 2010    invasive ductal R breast; s/p lumpectomy, radiation and chemo  . Obesity   . Breast cancer, stage 1   . GERD (gastroesophageal reflux  disease)     PAST SURGICAL HISTORY: Past Surgical History  Procedure Laterality Date  . Breast lumpectomy  2010    right  . Tubal ligation  2010  . Colonoscopy  2010  . Power port    . Lumbar laminectomy/decompression microdiscectomy  2007, 2008    Dr. Hal Neer x 2  . Uterine fibroid embolization  2008 or 2009  . Transthoracic echocardiogram   November 2014    Mild LVH, EF of roughly 50%. Pseudo-normal LV filling (grade 3 diastolic soft. Mild LA dilation. 1.3 x 1.3 cm mass in right atrium.  . Cardiac mri  December 2014    1.9 x 1.6 cm mass in the lateral RA cavity. -- Most suggestive of thrombus--  calcified anndd  laammiinnaated suggestive of chronic.; EF from 50-4% with no scar. Trivial effusion    FAMILY HISTORY Family History  Problem Relation Age of Onset  . Breast cancer Mother   . Cancer Maternal Grandmother     renal cancer    GYNECOLOGIC HISTORY:  Menarche at age 45, G84 P1, No history of estrogen replacement therapy, no history of abnormal pap smears, or sexually transmitted infections  SOCIAL HISTORY:  The patient is married to her husband Addysyn Fern for 10 years.  Lives with him in a two story house.  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    ADVANCED DIRECTIVES: Not in place.     HEALTH MAINTENANCE: History  Substance Use Topics  . Smoking status: Never Smoker   . Smokeless tobacco: Never Used  . Alcohol Use: No    Mammogram: 10/21/2013 Colonoscopy: 06/2009  Bone Density Scan: 10/2011  Pap Smear: 11/2011, scheduled in June, 2015 Eye Exam: 01/2013 Vitamin D Level: previously normal  Lipid Panel: unknown   Allergies  Allergen Reactions  . Adhesive [Tape] Hives  . Morphine Hives    Current Outpatient Prescriptions  Medication Sig Dispense Refill  . apixaban (ELIQUIS) 5 MG TABS tablet Take 1 tablet (5 mg total) by mouth 2 (two) times daily.  60 tablet  6  . ascorbic acid (VITAMIN C) 250  MG tablet Take 500 mg by mouth daily.      [name] BIOTIN PO Take 1 tablet by mouth daily.      . Calcium Carbonate-Vitamin D (CALCIUM + D) 600-200 MG-UNIT TABS Take 2 tablets by mouth daily.        . cetirizine (ZYRTEC) 10 MG tablet Take 10 mg by mouth daily.      [name] diltiazem (CARDIZEM CD) 180 MG 24 hr capsule Take 1 capsule (180 mg total) by mouth daily.  30 capsule  11  . furosemide (LASIX) 40 MG tablet Take 1 tablet (40 mg total) by mouth every other day.  30 tablet    . gabapentin (NEURONTIN) 600 MG tablet Take 600 mg by mouth at bedtime.      [name] lisinopril (PRINIVIL,ZESTRIL) 5 MG tablet Take 1 tablet (5 mg total) by mouth 2 (two) times daily.  60 tablet  3  . Multiple Vitamins-Minerals (MULTIVITAMIN WITH MINERALS) tablet Take 1 tablet by mouth daily.        . nabumetone (RELAFEN) 500 MG tablet Take 500 mg by mouth 2 (two) times daily.        [name] NEXIUM 40 MG capsule TAKE 1 CAPSULE BY MOUTH ONCE A DAY  30 capsule  2  . OVER THE COUNTER MEDICATION       . potassium chloride SA (K-DUR,KLOR-CON) 20 MEQ tablet Take 20 mEq by mouth every other day.       . tapentadol (NUCYNTA) 50 MG TABS tablet Take 50 mg by mouth every 6 (six) hours as needed.      [name] UNABLE TO FIND 1 tablet. i Cool supplement  Take 1 tablet daily for hot flashes      . vitamin E 400 UNIT capsule Take 400 Units by mouth daily.       No current facility-administered medications for this visit.    OBJECTIVE: Filed Vitals:   12/03/13 1526  BP: 120/83  Pulse: 87  Temp: 98.9 F (37.2 C)  Resp: 18     Body mass index is 30.16 kg/(m^2).     GENERAL: Patient is a well appearing female in no acute distress HEENT:  Sclerae anicteric.  Oropharynx clear and moist. No ulcerations or evidence of oropharyngeal candidiasis. Neck is supple.  NODES:  No cervical, supraclavicular, or axillary lymphadenopathy palpated.  BREAST EXAM:  Right breast without nodules, masses, left breast without nodules or masses, benign bilateral breast exam.    LUNGS:  Clear to auscultation bilaterally.  No wheezes or rhonchi. HEART:  Regular rate and rhythm. No murmur appreciated. ABDOMEN:  Soft, nontender.  Positive, normoactive bowel sounds. No organomegaly palpated. MSK:  No focal spinal tenderness to palpation. Full range of motion bilaterally in the upper extremities. EXTREMITIES:  No peripheral edema.   SKIN:  Clear with no obvious rashes or skin changes. No nail dyscrasia. NEURO:  Nonfocal. Well oriented.  Appropriate affect. ECOG FS:1 - Symptomatic but completely ambulatory      LAB RESULTS:  CMP     Component Value Date/Time   NA 141 12/03/2013 1515   NA 136 08/10/2013 1415   NA 136 04/25/2010 1053   K 4.2 12/03/2013 1515   K 4.1 08/10/2013 1415   K 3.8 04/25/2010 1053   CL 98 08/10/2013 1415   CL 105 12/03/2012 1437   CL 99 04/25/2010 1053   CO2 27 12/03/2013 1515   CO2 28 08/10/2013 1415   CO2 28 04/25/2010 1053   GLUCOSE 98 12/03/2013 1515   GLUCOSE 76 08/10/2013 1415   GLUCOSE 100* 12/03/2012 1437   GLUCOSE 100 04/25/2010 1053   BUN 11.1 12/03/2013 1515   BUN 11 08/10/2013 1415   BUN 9 04/25/2010 1053   CREATININE 0.9 12/03/2013 1515   CREATININE 0.73 08/10/2013 1415   CREATININE 0.8 06/29/2013 1201   CALCIUM 9.6 12/03/2013 1515   CALCIUM 9.5 08/10/2013 1415   CALCIUM 9.7 04/25/2010 1053   PROT 7.7 12/03/2013 1515   PROT 7.4 06/11/2013 0805   PROT 7.9 04/25/2010 1053   ALBUMIN 3.9 12/03/2013 1515   ALBUMIN 3.6 06/11/2013 0805   AST 19 12/03/2013 1515   AST 25 06/11/2013 0805   AST 28 04/25/2010 1053   ALT 13 12/03/2013 1515   ALT 16 06/11/2013 0805   ALT 20 04/25/2010 1053   ALKPHOS 85 12/03/2013 1515   ALKPHOS 71 06/11/2013 0805   ALKPHOS 115* 04/25/2010 1053   BILITOT 0.35 12/03/2013 1515   BILITOT 0.3 06/11/2013 0805   BILITOT 0.60 04/25/2010 1053   GFRNONAA >90 06/11/2013 0805   GFRAA >90 06/11/2013 0805    I No results found for this basename: SPEP,  UPEP,   kappa and lambda light chains    Lab Results  Component Value Date   WBC 6.8  12/03/2013   NEUTROABS 3.7 12/03/2013   HGB 13.3 12/03/2013   HCT 39.8 12/03/2013   MCV 92.3 12/03/2013   PLT 287 12/03/2013      Chemistry      Component Value Date/Time   NA 141 12/03/2013 1515   NA 136 08/10/2013 1415   NA 136 04/25/2010 1053   K 4.2 12/03/2013 1515   K 4.1 08/10/2013 1415   K 3.8 04/25/2010 1053   CL 98 08/10/2013 1415   CL 105 12/03/2012 1437   CL 99 04/25/2010 1053   CO2 27 12/03/2013 1515   CO2 28 08/10/2013 1415   CO2 28 04/25/2010 1053   BUN 11.1 12/03/2013 1515   BUN 11 08/10/2013 1415   BUN 9 04/25/2010 1053   CREATININE 0.9 12/03/2013 1515   CREATININE 0.73 08/10/2013 1415   CREATININE 0.8 06/29/2013 1201      Component Value Date/Time   CALCIUM 9.6 12/03/2013 1515   CALCIUM 9.5 08/10/2013 1415   CALCIUM 9.7 04/25/2010 1053   ALKPHOS 85 12/03/2013 1515   ALKPHOS 71 06/11/2013 0805   ALKPHOS 115* 04/25/2010 1053   AST 19 12/03/2013 1515   AST 25 06/11/2013 0805   AST 28 04/25/2010 1053   ALT 13 12/03/2013 1515   ALT 16 06/11/2013 0805   ALT 20 04/25/2010 1053   BILITOT 0.35 12/03/2013 1515   BILITOT 0.3 06/11/2013 0805   BILITOT 0.60 04/25/2010 1053       Lab  Results  Component Value Date   LABCA2 28 05/10/2011    No components found with this basename: [id number]    No results found for this basename: INR,  in the last 168 hours  Urinalysis    Component Value Date/Time   COLORURINE YELLOW 04/30/2009 [place] 04/30/2009 1731   LABSPEC 1.010 06/10/2013 1619   LABSPEC 1.005 03/15/2009 1017   PHURINE 6.0 06/10/2013 1619   GLUCOSEU NEGATIVE 06/10/2013 1619   HGBUR NEGATIVE 06/10/2013 1619   BILIRUBINUR n 08/18/2013 1202   BILIRUBINUR NEGATIVE 06/10/2013 1619   KETONESUR NEGATIVE 06/10/2013 1619   PROTEINUR trace 08/18/2013 1202   PROTEINUR NEGATIVE 06/10/2013 1619   UROBILINOGEN negative 08/18/2013 1202   UROBILINOGEN 0.2 06/10/2013 1619   NITRITE n 08/18/2013 1202   NITRITE NEGATIVE 06/10/2013 1619   LEUKOCYTESUR Negative 08/18/2013 1202    STUDIES: No  results found.  ASSESSMENT: 48 y.o.  [place], [place] woman with right sided T2 N0, stage IIA, grade III, invasive ductal carcinoma, ER negative, PR negative, Ki-67 77%, HER-2/neu negative.  1. Patient underwent a right breast lumpectomy on 09/17/2008 and a 2.3 and 1.7 cm grade III invasive ductal carcinoma was removed.  One sentinel node was negative.  The tumor was ER negative, PR negative, Ki-67 of 77%, HER-2/neu negative.    2. . Patient then received adjuvant chemotherapy consisting of Adriamycin Cytoxan given dose dense x4 cycles followed by Taxotere (at her best recollection) which she completed in September 2010.   3. Patient underwent adjuvant radiation therapy that completed in November 2010.  PLAN: Patient is doing very well today.  We reviewed her history and health maintenance above.  She has no sign of breast cancer recurrence.  In discussion of her social history and health maintenance she is on disability due to her back problems that are not related to her breast cancer.  Today I recommended healthy diet, exercise, self breast exams.  The patient is doing well and would like to return in 6 months for labs and evaluation.   She knows to call us in the interim for any questions or concerns.  We can certainly see her sooner if needed.  I spent 25 minutes counseling the patient face to face.  The total time spent in the appointment was 30 minutes.  Minette Headland, [place] [phone] 12/06/2013 5:45 AM

## 2013-12-07 ENCOUNTER — Telehealth: Payer: Self-pay | Admitting: *Deleted

## 2013-12-07 NOTE — Telephone Encounter (Signed)
Called pt to ask where she would like [place] to schedule her bone density . No answer, but left a detailed message for pt to call me back @ [phone]. Message to be forwarded to Charlestine Massed, NP

## 2013-12-08 ENCOUNTER — Other Ambulatory Visit: Payer: Self-pay | Admitting: Adult Health

## 2013-12-08 DIAGNOSIS — E2839 Other primary ovarian failure: Secondary | ICD-10-CM

## 2013-12-14 ENCOUNTER — Telehealth: Payer: Self-pay | Admitting: Oncology

## 2013-12-14 NOTE — Telephone Encounter (Signed)
lmonvm for pt re appt for bone density 6/4 and f/u 11/11. schedule mailed.

## 2013-12-31 ENCOUNTER — Other Ambulatory Visit: Payer: PRIVATE HEALTH INSURANCE

## 2014-01-05 ENCOUNTER — Ambulatory Visit
Admission: RE | Admit: 2014-01-05 | Discharge: 2014-01-05 | Disposition: A | Discharge status: Home / Self Care | Payer: PRIVATE HEALTH INSURANCE | Source: Ambulatory Visit | Attending: Adult Health | Admitting: Adult Health

## 2014-01-05 DIAGNOSIS — E2839 Other primary ovarian failure: Secondary | ICD-10-CM

## 2014-01-06 ENCOUNTER — Telehealth: Payer: Self-pay | Admitting: *Deleted

## 2014-01-06 NOTE — Telephone Encounter (Signed)
Called pt to inform her of bone density results. Communicated with pt that results were normal. Pt will see [place] in office on 06/09/14. Message to be forwarded to Charlestine Massed, NP.

## 2014-01-06 NOTE — Telephone Encounter (Signed)
Message copied by Harmon Pier on Wed Jan 06, 2014  4:57 PM ------      Message from: Minette Headland      Created: Wed Jan 06, 2014 12:36 PM       Please call patient.  Her bone density is normal.        ----- Message -----         From: Rad Results In Interface         Sent: 01/05/2014   6:02 PM           To: Minette Headland, NP                   ------

## 2014-01-11 ENCOUNTER — Telehealth: Payer: Self-pay | Admitting: Internal Medicine

## 2014-01-11 NOTE — Telephone Encounter (Signed)
Erlene Quan will call pt back about July appoint with Dr. Harrington Challenger. Horton Chin RN

## 2014-01-11 NOTE — Telephone Encounter (Signed)
New Message:  Pt is c/o being "really exhausted and tired"... Pt is requesting to be worked in.. Pt does not want to wait for Dr. Alan Ripper next appt in Aug or the PA's in July.. Pt is requesting a call back from the nurse.

## 2014-01-11 NOTE — Telephone Encounter (Signed)
New Message:  Pt is requesting to be worked in sooner to see Dr. Harrington Challenger.

## 2014-01-11 NOTE — Telephone Encounter (Signed)
Pt calling b/c she would like to know:  1.) Does Dr. Harrington Challenger want pt to have another CT to evaluate Thrombus?  2.) Would like Dr. Harrington Challenger to switch her Diltiazem to something else-- b/c she has read that it can weaken the heart muscle & she consulted with a specialist at [facility] in [place] that told her she should have her medication switched.  I will forward these request to Dr. Harrington Challenger for review Horton Chin RN

## 2014-01-12 NOTE — Telephone Encounter (Signed)
Sched f/u cardiac MR for thrombus Diltiazem should not make heart weaker but can switch her to  Amlodipine 2.5 which she was on in past Make sure she has f/u

## 2014-01-13 ENCOUNTER — Encounter: Payer: Self-pay | Admitting: Internal Medicine

## 2014-01-13 ENCOUNTER — Telehealth: Payer: Self-pay

## 2014-01-13 NOTE — Telephone Encounter (Signed)
Rcvd by mail bone scan dtd 01/05/14 from [place].  Original to scan.  Copy to [place].

## 2014-01-13 NOTE — Telephone Encounter (Signed)
Follow Up ° ° ° ° °Pt calling returning call from earlier. Please call back. °

## 2014-01-13 NOTE — Telephone Encounter (Signed)
Left message for pt to call.

## 2014-01-15 ENCOUNTER — Other Ambulatory Visit: Payer: Self-pay | Admitting: Obstetrics and Gynecology

## 2014-01-15 DIAGNOSIS — N644 Mastodynia: Secondary | ICD-10-CM

## 2014-01-18 ENCOUNTER — Other Ambulatory Visit: Payer: Self-pay | Admitting: Obstetrics and Gynecology

## 2014-01-18 DIAGNOSIS — N644 Mastodynia: Secondary | ICD-10-CM

## 2014-01-25 ENCOUNTER — Ambulatory Visit
Admission: RE | Admit: 2014-01-25 | Discharge: 2014-01-25 | Disposition: A | Discharge status: Home / Self Care | Payer: PRIVATE HEALTH INSURANCE | Source: Ambulatory Visit | Attending: Obstetrics and Gynecology | Admitting: Obstetrics and Gynecology

## 2014-01-25 DIAGNOSIS — N644 Mastodynia: Secondary | ICD-10-CM

## 2014-02-08 ENCOUNTER — Ambulatory Visit: Payer: PRIVATE HEALTH INSURANCE | Admitting: Internal Medicine

## 2014-02-19 ENCOUNTER — Encounter: Payer: Self-pay | Admitting: Medical

## 2014-02-19 ENCOUNTER — Ambulatory Visit (INDEPENDENT_AMBULATORY_CARE_PROVIDER_SITE_OTHER): Payer: PRIVATE HEALTH INSURANCE | Admitting: Medical

## 2014-02-19 VITALS — BP 118/80 | HR 60 | Temp 98.0°F | Resp 14 | Wt 185.0 lb

## 2014-02-19 DIAGNOSIS — N390 Urinary tract infection, site not specified: Secondary | ICD-10-CM

## 2014-02-19 DIAGNOSIS — R300 Dysuria: Secondary | ICD-10-CM

## 2014-02-19 LAB — POCT URINALYSIS DIPSTICK
Bilirubin, UA: NEGATIVE
Glucose, UA: NEGATIVE
Ketones, UA: NEGATIVE
Nitrite, UA: POSITIVE
PH UA: 5
Spec Grav, UA: 1.015
Urobilinogen, UA: NEGATIVE

## 2014-02-19 MED ORDER — AMOXICILLIN 875 MG PO TABS: 875.0000 mg | ORAL_TABLET | Freq: Two times a day (BID) | ORAL | Status: DC

## 2014-02-19 NOTE — Progress Notes (Signed)
Subjective:  Olivia [name] is a 48 y.o. female who complains of possible urinary tract infection.  She notes urinary urgency, pain and pressure with urination x 2 days.  No fever, no blood, no NVD.   No vaginal symptoms.  Last UTI was 07/2013.   Using nothing for current symptoms. No other aggravating or relieving factors.  No other c/o.  Past Medical History  Diagnosis Date  . Colon polyp   . Obesity   . Uterine fibroid     s/p embolization  . Lumbar degenerative disc disease   . History of breast cancer 2010    invasive ductal R breast; s/p lumpectomy, radiation and chemo  . Obesity   . Breast cancer, stage 1   . GERD (gastroesophageal reflux disease)     ROS as in subjective  Reviewed allergies, medications, past medical, surgical, and social history.    Objective: Filed Vitals:   02/19/14 1445  BP: 118/80  Pulse: 60  Temp: 98 F (36.7 C)  Resp: 14    General appearance: alert, no distress, WD/WN, female Abdomen: +bs, soft, non tender, non distended, no masses, no hepatomegaly, no splenomegaly, no bruits Back: no CVA tenderness GU: deferred    Laboratory:  Results for orders placed in visit on 02/19/14 (from the past 24 hour(s))  POCT URINALYSIS DIPSTICK     Status: None   Collection Time    02/19/14  2:47 PM      Result Value Ref Range   Color, UA yellow     Clarity, UA hazy     Glucose, UA neg     Bilirubin, UA neg     Ketones, UA neg     Spec Grav, UA 1.015     Blood, UA large     pH, UA 5.0     Protein, UA moderate     Urobilinogen, UA negative     Nitrite, UA positive     Leukocytes, UA large (3+)        Assessment: Encounter Diagnoses  Name Primary?  . Urinary tract infection without hematuria, site unspecified Yes  . Burning with urination      Plan: Discussed symptoms, diagnosis of UTI, possible complications, and usual course of illness.  Begin medication Amoxicillin per orders below.  Reviewed prior multi cultures, all with good  sensitivities, no major prior resistances.  Advised increased water intake, can use OTC Tylenol for pain.       Call or return if worse or not improving.

## 2014-02-26 ENCOUNTER — Other Ambulatory Visit: Payer: Self-pay | Admitting: *Deleted

## 2014-02-26 DIAGNOSIS — R002 Palpitations: Secondary | ICD-10-CM

## 2014-02-26 MED ORDER — LISINOPRIL 5 MG PO TABS: 5.0000 mg | ORAL_TABLET | Freq: Two times a day (BID) | ORAL | Status: DC

## 2014-02-26 NOTE — Telephone Encounter (Signed)
Rx was sent to pharmacy electronically. 

## 2014-02-26 NOTE — Telephone Encounter (Signed)
This encounter was created in error - please disregard.

## 2014-03-17 ENCOUNTER — Other Ambulatory Visit: Payer: Self-pay | Admitting: Medical

## 2014-04-16 ENCOUNTER — Other Ambulatory Visit: Payer: Self-pay | Admitting: Medical

## 2014-05-01 ENCOUNTER — Other Ambulatory Visit: Payer: Self-pay | Admitting: Internal Medicine

## 2014-05-07 ENCOUNTER — Telehealth: Payer: Self-pay | Admitting: Adult Health

## 2014-05-07 NOTE — Telephone Encounter (Signed)
, °

## 2014-05-19 ENCOUNTER — Other Ambulatory Visit: Payer: Self-pay | Admitting: Medical

## 2014-05-29 ENCOUNTER — Other Ambulatory Visit: Payer: Self-pay | Admitting: Cardiology

## 2014-06-09 ENCOUNTER — Ambulatory Visit: Payer: PRIVATE HEALTH INSURANCE | Admitting: Adult Health

## 2014-06-10 ENCOUNTER — Encounter: Payer: Self-pay | Admitting: Adult Health

## 2014-06-10 ENCOUNTER — Telehealth: Payer: Self-pay | Admitting: Adult Health

## 2014-06-10 ENCOUNTER — Ambulatory Visit (HOSPITAL_BASED_OUTPATIENT_CLINIC_OR_DEPARTMENT_OTHER): Payer: PRIVATE HEALTH INSURANCE | Admitting: Adult Health

## 2014-06-10 ENCOUNTER — Ambulatory Visit (HOSPITAL_BASED_OUTPATIENT_CLINIC_OR_DEPARTMENT_OTHER): Payer: PRIVATE HEALTH INSURANCE

## 2014-06-10 VITALS — BP 132/84 | HR 89 | Temp 97.8°F | Resp 18 | Ht 66.0 in | Wt 191.0 lb

## 2014-06-10 DIAGNOSIS — Z853 Personal history of malignant neoplasm of breast: Secondary | ICD-10-CM

## 2014-06-10 DIAGNOSIS — C50919 Malignant neoplasm of unspecified site of unspecified female breast: Secondary | ICD-10-CM

## 2014-06-10 DIAGNOSIS — R51 Headache: Secondary | ICD-10-CM

## 2014-06-10 DIAGNOSIS — R519 Headache, unspecified: Secondary | ICD-10-CM

## 2014-06-10 LAB — CBC WITH DIFFERENTIAL/PLATELET
BASO%: 0.7 % (ref 0.0–2.0)
Basophils Absolute: 0.1 10*3/uL (ref 0.0–0.1)
EOS%: 1.6 % (ref 0.0–7.0)
Eosinophils Absolute: 0.1 10*3/uL (ref 0.0–0.5)
HCT: 39.8 % (ref 34.8–46.6)
HGB: 13.2 g/dL (ref 11.6–15.9)
LYMPH%: 40.1 % (ref 14.0–49.7)
MCH: 30.5 pg (ref 25.1–34.0)
MCHC: 33.1 g/dL (ref 31.5–36.0)
MCV: 92 fL (ref 79.5–101.0)
MONO#: 0.5 10*3/uL (ref 0.1–0.9)
MONO%: 6.6 % (ref 0.0–14.0)
NEUT#: 3.9 10*3/uL (ref 1.5–6.5)
NEUT%: 51 % (ref 38.4–76.8)
Platelets: 284 10*3/uL (ref 145–400)
RBC: 4.33 10*6/uL (ref 3.70–5.45)
RDW: 12.8 % (ref 11.2–14.5)
WBC: 7.6 10*3/uL (ref 3.9–10.3)
lymph#: 3.1 10*3/uL (ref 0.9–3.3)

## 2014-06-10 LAB — COMPREHENSIVE METABOLIC PANEL (CC13)
ALK PHOS: 79 U/L (ref 40–150)
ALT: 13 U/L (ref 0–55)
AST: 18 U/L (ref 5–34)
Albumin: 3.8 g/dL (ref 3.5–5.0)
Anion Gap: 5 mEq/L (ref 3–11)
BUN: 9.9 mg/dL (ref 7.0–26.0)
CO2: 29 mEq/L (ref 22–29)
Calcium: 9.5 mg/dL (ref 8.4–10.4)
Chloride: 106 mEq/L (ref 98–109)
Creatinine: 0.9 mg/dL (ref 0.6–1.1)
Glucose: 88 mg/dl (ref 70–140)
POTASSIUM: 3.9 meq/L (ref 3.5–5.1)
SODIUM: 139 meq/L (ref 136–145)
TOTAL PROTEIN: 7.8 g/dL (ref 6.4–8.3)
Total Bilirubin: 0.2 mg/dL (ref 0.20–1.20)

## 2014-06-10 NOTE — Progress Notes (Signed)
[place]  Telephone:(336) [phone] Fax:(336) [phone]     ID: Olivia [name] OB: [birth year]  MR#: [id number]  XBW#:[id number]  PCP: [name], MD GYN:  Dr. Garwin Brothers SU: Dr. Erroll Luna OTHER MD: Dr. Wyline Mood oncology  CHIEF COMPLAINT:  48 year old Olivia [name] woman with right sided T2 N0, stage IIA, grade III, invasive ductal carcinoma, ER negative, PR negative, Ki-67 77%, HER-2/neu negative.  BREAST CANCER HISTORY:  #1 The patient noted a new palpable mass in her right breast and underwent a diagnostic mammogram in January, 2010.  Biopsy was consistent with invasive ductal carcinoma.  A MRI of the breasts revealed a 2.4 cm right breast mass and a satellite lesion.  At 6 o'clock however, a round oval mass was seen on MRI and a biopsy determined it was not malignant.  Patient underwent a right breast lumpectomy on 09/17/2008 and a 2.3 and 1.7 cm grade III invasive ductal carcinoma was removed.  One sentinel node was negative.  The tumor was ER negative, PR negative, Ki-67 of 77%, HER-2/neu negative.    CURRENT THERAPY: observation  INTERVAL HISTORY:  Olivia [name] is here today for her h/o triple negative breast cancer.  She is doing well today.  She has had a static feeling, posterior headache and cloudy feeling in her head.  She is very concerned that her cancer is back.  She denies any other pain, or concerns.   REVIEW OF SYSTEMS:  A 10 point review of systems was conducted and is otherwise negative except for what is noted above.     PAST MEDICAL HISTORY: Past Medical History  Diagnosis Date  . Colon polyp   . Obesity   . Uterine fibroid     s/p embolization  . Lumbar degenerative disc disease   . History of breast cancer 2010    invasive ductal R breast; s/p lumpectomy, radiation and chemo  . Obesity   . Breast cancer, stage 1   . GERD (gastroesophageal reflux disease)     PAST SURGICAL HISTORY: Past Surgical History  Procedure Laterality  Date  . Breast lumpectomy  2010    right  . Tubal ligation  2010  . Colonoscopy  2010  . Power port    . Lumbar laminectomy/decompression microdiscectomy  2007, 2008    Dr. Hal Neer x 2  . Uterine fibroid embolization  2008 or 2009  . Transthoracic echocardiogram   November 2014    Mild LVH, EF of roughly 50%. Pseudo-normal LV filling (grade 3 diastolic soft. Mild LA dilation. 1.3 x 1.3 cm mass in right atrium.  . Cardiac mri  December 2014    1.9 x 1.6 cm mass in the lateral RA cavity. -- Most suggestive of thrombus--  calcified anndd  laammiinnaated suggestive of chronic.; EF from 50-4% with no scar. Trivial effusion    FAMILY HISTORY Family History  Problem Relation Age of Onset  . Breast cancer Mother   . Cancer Maternal Grandmother     renal cancer    GYNECOLOGIC HISTORY:  Menarche at age 59, G62 P1, No history of estrogen replacement therapy, no history of abnormal pap smears, or sexually transmitted infections  SOCIAL HISTORY:  The patient is married to her husband Aydin Cavalieri for 10 years.  Lives with him in a two story house.  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    ADVANCED DIRECTIVES: Not in place.     HEALTH MAINTENANCE: History  Substance Use Topics  . Smoking status: Never Smoker   . Smokeless tobacco: Never Used  . Alcohol Use: No    Mammogram: 10/21/2013 Colonoscopy: 06/2009  Bone Density Scan: 10/2011  Pap Smear: 11/2011, scheduled in June, 2015 Eye Exam: 01/2013 Vitamin D Level: previously normal  Lipid Panel: unknown   Allergies  Allergen Reactions  . Adhesive [Tape] Hives  . Morphine Hives    Current Outpatient Prescriptions  Medication Sig Dispense Refill  . amoxicillin (AMOXIL) 875 MG tablet Take 1 tablet (875 mg total) by mouth 2 (two) times daily. 20 tablet 0  . ascorbic acid (VITAMIN C) 250 MG tablet Take 500 mg by mouth daily.    [name] BIOTIN PO Take 1 tablet by mouth daily.     . Calcium Carbonate-Vitamin D (CALCIUM + D) 600-200 MG-UNIT TABS Take 2 tablets by mouth daily.      . cetirizine (ZYRTEC) 10 MG tablet Take 10 mg by mouth daily.    [name] diltiazem (CARDIZEM CD) 180 MG 24 hr capsule Take 1 capsule (180 mg total) by mouth daily. 30 capsule 11  . ELIQUIS 5 MG TABS tablet TAKE 1 TABLET BY MOUTH TWICE DAILY. NEED OFFICE VISIT FOR FURTHER REFILLS 60 tablet 0  . furosemide (LASIX) 40 MG tablet Take 1 tablet (40 mg total) by mouth every other day. 30 tablet   . gabapentin (NEURONTIN) 600 MG tablet Take 600 mg by mouth at bedtime.    [name] lisinopril (PRINIVIL,ZESTRIL) 5 MG tablet Take 1 tablet (5 mg total) by mouth 2 (two) times daily. 60 tablet 8  . Multiple Vitamins-Minerals (MULTIVITAMIN WITH MINERALS) tablet Take 1 tablet by mouth daily.      . nabumetone (RELAFEN) 500 MG tablet Take 500 mg by mouth 2 (two) times daily.      [name] NEXIUM 40 MG capsule TAKE 1 CAPSULE BY MOUTH ONCE A DAY 30 capsule 0  . OVER THE COUNTER MEDICATION     . potassium chloride SA (K-DUR,KLOR-CON) 20 MEQ tablet Take 20 mEq by mouth every other day.     . tapentadol (NUCYNTA) 50 MG TABS tablet Take 50 mg by mouth every 6 (six) hours as needed.    [name] UNABLE TO FIND 1 tablet. i Cool supplement  Take 1 tablet daily for hot flashes    . vitamin E 400 UNIT capsule Take 400 Units by mouth daily.     No current facility-administered medications for this visit.    OBJECTIVE: Filed Vitals:   06/10/14 1417  BP: 132/84  Pulse: 89  Temp: 97.8 F (36.6 C)  Resp: 18     Body mass index is 30.84 kg/(m^2).     GENERAL: Patient is a well appearing female in no acute distress HEENT:  Sclerae anicteric.  Oropharynx clear and moist. No ulcerations or evidence of oropharyngeal candidiasis. Neck is supple.  NODES:  No cervical, supraclavicular, or axillary lymphadenopathy palpated.  BREAST EXAM:  Right breast without nodules, masses, left breast without nodules or masses, benign bilateral breast exam.  LUNGS:   Clear to auscultation bilaterally.  No wheezes or rhonchi. HEART:  Regular rate and rhythm. No murmur appreciated. ABDOMEN:  Soft, nontender.  Positive, normoactive bowel sounds. No organomegaly palpated. MSK:  No focal spinal tenderness to palpation. Full range of motion bilaterally in the upper extremities. EXTREMITIES:  No peripheral edema.   SKIN:  Clear with no obvious rashes or skin changes. No nail dyscrasia. NEURO:  Nonfocal. Well oriented.  Appropriate affect. ECOG FS:1 -  Symptomatic but completely ambulatory      LAB RESULTS:  CMP     Component Value Date/Time   NA 141 12/03/2013 1515   NA 136 08/10/2013 1415   NA 136 04/25/2010 1053   K 4.2 12/03/2013 1515   K 4.1 08/10/2013 1415   K 3.8 04/25/2010 1053   CL 98 08/10/2013 1415   CL 105 12/03/2012 1437   CL 99 04/25/2010 1053   CO2 27 12/03/2013 1515   CO2 28 08/10/2013 1415   CO2 28 04/25/2010 1053   GLUCOSE 98 12/03/2013 1515   GLUCOSE 76 08/10/2013 1415   GLUCOSE 100* 12/03/2012 1437   GLUCOSE 100 04/25/2010 1053   BUN 11.1 12/03/2013 1515   BUN 11 08/10/2013 1415   BUN 9 04/25/2010 1053   CREATININE 0.9 12/03/2013 1515   CREATININE 0.73 08/10/2013 1415   CREATININE 0.8 06/29/2013 1201   CALCIUM 9.6 12/03/2013 1515   CALCIUM 9.5 08/10/2013 1415   CALCIUM 9.7 04/25/2010 1053   PROT 7.7 12/03/2013 1515   PROT 7.4 06/11/2013 0805   PROT 7.9 04/25/2010 1053   ALBUMIN 3.9 12/03/2013 1515   ALBUMIN 3.6 06/11/2013 0805   AST 19 12/03/2013 1515   AST 25 06/11/2013 0805   AST 28 04/25/2010 1053   ALT 13 12/03/2013 1515   ALT 16 06/11/2013 0805   ALT 20 04/25/2010 1053   ALKPHOS 85 12/03/2013 1515   ALKPHOS 71 06/11/2013 0805   ALKPHOS 115* 04/25/2010 1053   BILITOT 0.35 12/03/2013 1515   BILITOT 0.3 06/11/2013 0805   BILITOT 0.60 04/25/2010 1053   GFRNONAA >90 06/11/2013 0805   GFRAA >90 06/11/2013 0805    I No results found for: SPEP  Lab Results  Component Value Date   WBC 6.8 12/03/2013    NEUTROABS 3.7 12/03/2013   HGB 13.3 12/03/2013   HCT 39.8 12/03/2013   MCV 92.3 12/03/2013   PLT 287 12/03/2013      Chemistry      Component Value Date/Time   NA 141 12/03/2013 1515   NA 136 08/10/2013 1415   NA 136 04/25/2010 1053   K 4.2 12/03/2013 1515   K 4.1 08/10/2013 1415   K 3.8 04/25/2010 1053   CL 98 08/10/2013 1415   CL 105 12/03/2012 1437   CL 99 04/25/2010 1053   CO2 27 12/03/2013 1515   CO2 28 08/10/2013 1415   CO2 28 04/25/2010 1053   BUN 11.1 12/03/2013 1515   BUN 11 08/10/2013 1415   BUN 9 04/25/2010 1053   CREATININE 0.9 12/03/2013 1515   CREATININE 0.73 08/10/2013 1415   CREATININE 0.8 06/29/2013 1201      Component Value Date/Time   CALCIUM 9.6 12/03/2013 1515   CALCIUM 9.5 08/10/2013 1415   CALCIUM 9.7 04/25/2010 1053   ALKPHOS 85 12/03/2013 1515   ALKPHOS 71 06/11/2013 0805   ALKPHOS 115* 04/25/2010 1053   AST 19 12/03/2013 1515   AST 25 06/11/2013 0805   AST 28 04/25/2010 1053   ALT 13 12/03/2013 1515   ALT 16 06/11/2013 0805   ALT 20 04/25/2010 1053   BILITOT 0.35 12/03/2013 1515   BILITOT 0.3 06/11/2013 0805   BILITOT 0.60 04/25/2010 1053       Lab Results  Component Value Date   LABCA2 28 05/10/2011    No components found for: HTDSK876  No results for input(s): INR in the last 168 hours.  Urinalysis    Component Value Date/Time   COLORURINE YELLOW 04/30/2009  [place] 04/30/2009 1731   LABSPEC 1.010 06/10/2013 1619   LABSPEC 1.005 03/15/2009 1017   PHURINE 6.0 06/10/2013 1619   GLUCOSEU NEGATIVE 06/10/2013 1619   HGBUR NEGATIVE 06/10/2013 1619   BILIRUBINUR neg 02/19/2014 1447   BILIRUBINUR NEGATIVE 06/10/2013 1619   KETONESUR NEGATIVE 06/10/2013 1619   PROTEINUR moderate 02/19/2014 1447   PROTEINUR NEGATIVE 06/10/2013 1619   UROBILINOGEN negative 02/19/2014 1447   UROBILINOGEN 0.2 06/10/2013 1619   NITRITE positive 02/19/2014 1447   NITRITE NEGATIVE 06/10/2013 1619   LEUKOCYTESUR large (3+)  02/19/2014 1447    STUDIES: No results found.  ASSESSMENT: 48 y.o.  [place], [place] woman with right sided T2 N0, stage IIA, grade III, invasive ductal carcinoma, ER negative, PR negative, Ki-67 77%, HER-2/neu negative.  1. Patient underwent a right breast lumpectomy on 09/17/2008 and a 2.3 and 1.7 cm grade III invasive ductal carcinoma was removed.  One sentinel node was negative.  The tumor was ER negative, PR negative, Ki-67 of 77%, HER-2/neu negative.    2. . Patient then received adjuvant chemotherapy consisting of Adriamycin Cytoxan given dose dense x4 cycles followed by Taxotere (at her best recollection) which she completed in September 2010.   3. Patient underwent adjuvant radiation therapy that completed in November 2010.  PLAN: Hagen is doing moderately well today.  Due to her new headaches and sensations going on in her head I have ordered an MRI of the brain.  I recommended healthy diet, exercise and monthly breast exams.  She will return in 6 months for labs and follow up.    I spent 25 minutes counseling the patient face to face.  The total time spent in the appointment was 30 minutes.  Minette Headland, [place] [phone] 06/10/2014 2:18 PM

## 2014-06-10 NOTE — Telephone Encounter (Signed)
per pfo to sch pt appt-gave pt copy of sch-pt rea feamle DR-sch w/Feng

## 2014-06-10 NOTE — Patient Instructions (Signed)
You are doing well.  I recommend healthy diet, exercise, and monthly breast exams.    Breast Self-Awareness Practicing breast self-awareness may pick up problems early, prevent significant medical complications, and possibly save your life. By practicing breast self-awareness, you can become familiar with how your breasts look and feel and if your breasts are changing. This allows you to notice changes early. It can also offer you some reassurance that your breast health is good. One way to learn what is normal for your breasts and whether your breasts are changing is to do a breast self-exam. If you find a lump or something that was not present in the past, it is best to contact your caregiver right away. Other findings that should be evaluated by your caregiver include nipple discharge, especially if it is bloody; skin changes or reddening; areas where the skin seems to be pulled in (retracted); or new lumps and bumps. Breast pain is seldom associated with cancer (malignancy), but should also be evaluated by a caregiver. HOW TO PERFORM A BREAST SELF-EXAM The best time to examine your breasts is 5-7 days after your menstrual period is over. During menstruation, the breasts are lumpier, and it may be more difficult to pick up changes. If you do not menstruate, have reached menopause, or had your uterus removed (hysterectomy), you should examine your breasts at regular intervals, such as monthly. If you are breastfeeding, examine your breasts after a feeding or after using a breast pump. Breast implants do not decrease the risk for lumps or tumors, so continue to perform breast self-exams as recommended. Talk to your caregiver about how to determine the difference between the implant and breast tissue. Also, talk about the amount of pressure you should use during the exam. Over time, you will become more familiar with the variations of your breasts and more comfortable with the exam. A breast self-exam requires  you to remove all your clothes above the waist. 1. Look at your breasts and nipples. Stand in front of a mirror in a room with good lighting. With your hands on your hips, push your hands firmly downward. Look for a difference in shape, contour, and size from one breast to the other (asymmetry). Asymmetry includes puckers, dips, or bumps. Also, look for skin changes, such as reddened or scaly areas on the breasts. Look for nipple changes, such as discharge, dimpling, repositioning, or redness. 2. Carefully feel your breasts. This is best done either in the shower or tub while using soapy water or when flat on your back. Place the arm (on the side of the breast you are examining) above your head. Use the pads (not the fingertips) of your three middle fingers on your opposite hand to feel your breasts. Start in the underarm area and use  inch (2 cm) overlapping circles to feel your breast. Use 3 different levels of pressure (light, medium, and firm pressure) at each circle before moving to the next circle. The light pressure is needed to feel the tissue closest to the skin. The medium pressure will help to feel breast tissue a little deeper, while the firm pressure is needed to feel the tissue close to the ribs. Continue the overlapping circles, moving downward over the breast until you feel your ribs below your breast. Then, move one finger-width towards the center of the body. Continue to use the  inch (2 cm) overlapping circles to feel your breast as you move slowly up toward the collar bone (clavicle) near the base  of the neck. Continue the up and down exam using all 3 pressures until you reach the middle of the chest. Do this with each breast, carefully feeling for lumps or changes. 3.  Keep a written record with breast changes or normal findings for each breast. By writing this information down, you do not need to depend only on memory for size, tenderness, or location. Write down where you are in your  menstrual cycle, if you are still menstruating. Breast tissue can have some lumps or thick tissue. However, see your caregiver if you find anything that concerns you.  SEEK MEDICAL CARE IF:  You see a change in shape, contour, or size of your breasts or nipples.   You see skin changes, such as reddened or scaly areas on the breasts or nipples.   You have an unusual discharge from your nipples.   You feel a new lump or unusually thick areas.  Document Released: 07/16/2005 Document Revised: 07/02/2012 Document Reviewed: 10/31/2011 [facility] Patient Information 2015 [place], [place]. This information is not intended to replace advice given to you by your health care provider. Make sure you discuss any questions you have with your health care provider.

## 2014-06-17 ENCOUNTER — Ambulatory Visit (HOSPITAL_COMMUNITY)
Admission: RE | Admit: 2014-06-17 | Discharge: 2014-06-17 | Disposition: A | Discharge status: Home / Self Care | Payer: PRIVATE HEALTH INSURANCE | Source: Ambulatory Visit | Attending: Adult Health | Admitting: Adult Health

## 2014-06-17 DIAGNOSIS — G939 Disorder of brain, unspecified: Secondary | ICD-10-CM | POA: Insufficient documentation

## 2014-06-17 DIAGNOSIS — C50919 Malignant neoplasm of unspecified site of unspecified female breast: Secondary | ICD-10-CM

## 2014-06-17 DIAGNOSIS — H538 Other visual disturbances: Secondary | ICD-10-CM | POA: Diagnosis not present

## 2014-06-17 DIAGNOSIS — R51 Headache: Secondary | ICD-10-CM | POA: Insufficient documentation

## 2014-06-17 DIAGNOSIS — R519 Headache, unspecified: Secondary | ICD-10-CM

## 2014-06-17 MED ORDER — GADOBENATE DIMEGLUMINE 529 MG/ML IV SOLN
17.0000 mL | Freq: Once | INTRAVENOUS | Status: AC | PRN
  Administered 2014-06-17: 17 mL via INTRAVENOUS

## 2014-06-18 ENCOUNTER — Telehealth: Payer: Self-pay | Admitting: *Deleted

## 2014-06-18 ENCOUNTER — Encounter: Payer: Self-pay | Admitting: Medical

## 2014-06-18 NOTE — Telephone Encounter (Signed)
Called to inform pt of results concerning MRI/Brain she had done on yesterday. Communicated with pt that her results were normal. I asked pt was she experiencing any headaches or blurred vision today. Pt responded saying," No headaches today but my head feels a little cloudy, but no pain". Pt was relieved about her results. Message to be forwarded to [place].

## 2014-06-21 ENCOUNTER — Other Ambulatory Visit: Payer: Self-pay | Admitting: Medical

## 2014-06-22 ENCOUNTER — Other Ambulatory Visit: Payer: Self-pay | Admitting: Medical

## 2014-06-25 ENCOUNTER — Ambulatory Visit (HOSPITAL_COMMUNITY): Payer: PRIVATE HEALTH INSURANCE

## 2014-07-01 ENCOUNTER — Other Ambulatory Visit: Payer: Self-pay | Admitting: Cardiology

## 2014-07-16 ENCOUNTER — Other Ambulatory Visit: Payer: Self-pay | Admitting: Medical

## 2014-08-04 ENCOUNTER — Other Ambulatory Visit: Payer: Self-pay | Admitting: Cardiology

## 2014-08-12 ENCOUNTER — Other Ambulatory Visit: Payer: Self-pay | Admitting: Internal Medicine

## 2014-08-12 NOTE — Telephone Encounter (Signed)
Rx(s) sent to pharmacy electronically.  

## 2014-08-13 ENCOUNTER — Other Ambulatory Visit: Payer: Self-pay | Admitting: Obstetrics and Gynecology

## 2014-08-13 DIAGNOSIS — Z853 Personal history of malignant neoplasm of breast: Secondary | ICD-10-CM

## 2014-09-13 ENCOUNTER — Telehealth: Payer: Self-pay | Admitting: Cardiology

## 2014-09-13 NOTE — Telephone Encounter (Signed)
Called patient and let her know that Rx was filled in January and she should have enough refills to get her to appointment next month with Dr.Harding.

## 2014-09-13 NOTE — Telephone Encounter (Signed)
°  1. Which medications need to be refilled? Diltiazem 180 mg-new prescription,never filled here before 2. Which pharmacy is medication to be sent to?[phone] 3. Do they need a 30 day or 90 day supply? 30  4. Would they like a call back once the medication has been sent to the pharmacy? yes

## 2014-10-09 ENCOUNTER — Other Ambulatory Visit: Payer: Self-pay | Admitting: Medical

## 2014-10-09 ENCOUNTER — Other Ambulatory Visit: Payer: Self-pay | Admitting: Cardiology

## 2014-10-11 NOTE — Telephone Encounter (Signed)
Rx refill sent to patient pharmacy   

## 2014-10-14 ENCOUNTER — Ambulatory Visit (INDEPENDENT_AMBULATORY_CARE_PROVIDER_SITE_OTHER): Payer: PRIVATE HEALTH INSURANCE | Admitting: Cardiology

## 2014-10-14 ENCOUNTER — Encounter: Payer: Self-pay | Admitting: Cardiology

## 2014-10-14 VITALS — BP 112/86 | HR 63 | Ht 66.0 in | Wt 188.0 lb

## 2014-10-14 DIAGNOSIS — R931 Abnormal findings on diagnostic imaging of heart and coronary circulation: Secondary | ICD-10-CM | POA: Diagnosis not present

## 2014-10-14 DIAGNOSIS — I213 ST elevation (STEMI) myocardial infarction of unspecified site: Secondary | ICD-10-CM | POA: Diagnosis not present

## 2014-10-14 DIAGNOSIS — I10 Essential (primary) hypertension: Secondary | ICD-10-CM

## 2014-10-14 DIAGNOSIS — E785 Hyperlipidemia, unspecified: Secondary | ICD-10-CM

## 2014-10-14 DIAGNOSIS — R0602 Shortness of breath: Secondary | ICD-10-CM | POA: Diagnosis not present

## 2014-10-14 DIAGNOSIS — I519 Heart disease, unspecified: Secondary | ICD-10-CM

## 2014-10-14 DIAGNOSIS — R002 Palpitations: Secondary | ICD-10-CM | POA: Diagnosis not present

## 2014-10-14 DIAGNOSIS — I513 Intracardiac thrombosis, not elsewhere classified: Secondary | ICD-10-CM

## 2014-10-14 NOTE — Patient Instructions (Signed)

## 2014-10-16 ENCOUNTER — Encounter: Payer: Self-pay | Admitting: Cardiology

## 2014-10-16 DIAGNOSIS — I119 Hypertensive heart disease without heart failure: Secondary | ICD-10-CM | POA: Insufficient documentation

## 2014-10-16 NOTE — Assessment & Plan Note (Signed)
Stable on calcium channel blocker with increased dose of ACE inhibitor.

## 2014-10-16 NOTE — Assessment & Plan Note (Signed)
Notably improved. Does not really bother her nearly as much as before. She had significant diastolic dysfunction noted on her echocardiogram the past.  Recheck echocardiogram to reassess systolic and diastolic function as well as LV filling pressures and pulmonary pressures

## 2014-10-16 NOTE — Assessment & Plan Note (Signed)
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 The most likely etiology for the thrombus was her PICC line that was placed for her chemotherapy in 2010.  At this point I thinkit is clearly chronic it still there is laminated on the MRI.  Plan: Recheck echo to clearly evaluate right atrial thrombus. I have asked that Definity contrast be used if it would help with evaluating the right atrium. If the thrombus appears to be stable, not mobile and laminated or not present, I think we can safely stop the Eliquis. Would simply start aspirin.

## 2014-10-16 NOTE — Assessment & Plan Note (Signed)
She is on introduction of ACE inhibitor and calcium channel blocker. Normal EF calcium blocker were fine for rate control. Reassess echo,  No clear etiology for why she would have such diastolic dysfunction. It was read as grade 3 diastolic dysfunction on echo and grade 2 on another.

## 2014-10-16 NOTE — Assessment & Plan Note (Signed)
Finding of right atrial thrombus 2014. - Structure was confirmed to be likely chronic thrombus by MRI. Will recheck as noted above.

## 2014-10-16 NOTE — Assessment & Plan Note (Signed)
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 No more short bursts. Well controlled on calcium channel blocker. We did discuss vagal maneuvers.

## 2014-10-16 NOTE — Assessment & Plan Note (Signed)
No recent labs that I can see. She is not on any medications. Very poorly controlled 2013. Supposedly was monitored by her PCP. She states that she is due to have labs checked soon.

## 2014-10-16 NOTE — Progress Notes (Signed)
PCP: KNAPP,EVE A, MD  Clinic Note: Chief Complaint  Patient presents with  . Annual Exam    no chest pain , no sob , no edema  . Palpitations  . Anticoagulation    h/o R Atrial Thrombus from PICC line - on NOAC    HPI: Olivia [name] is a 49 y.o. female with a PMH below who presents today for one-year followup (was supposed to be 3 month) for the complaints above.  I last saw her her in March 2015. She was complaining of palpitations that was improved with diltiazem. Her ACE inhibitor dose was increased to 10 mg due to hypertension.  She was started on Eliquis for a chronic appearing right atrial thrombus, at the time I was not 100% clear there is any immediate reason for it.  Past Medical History  Diagnosis Date  . Colon polyp   . Obesity   . Uterine fibroid     s/p embolization  . Lumbar degenerative disc disease   . History of breast cancer 2010    invasive ductal R breast; s/p lumpectomy, radiation and chemo  . Obesity   . Breast cancer, stage 1   . GERD (gastroesophageal reflux disease)   . Right atrial thrombus Nov 2014    Echo: Mild LVH, EF of roughly 50%. Pseudo-normal LV filling (grade 3 diastolic soft. Mild LA dilation.   . H/O diastolic dysfunction nov 2014    1.3 x 1.3 cm mass in RA on Echo -->Cardiac MRI 12/'04: 1.9 x 1.6 cm mass in the lateral RA cavity. -- Most suggestive of thrombus--  calcified and  lamiina and ted suggestive of chronic.; EF from 50-4% with no scar.    Prior Cardiac Evaluation and Past Surgical History: Past Surgical History  Procedure Laterality Date  . Breast lumpectomy  2010    right  . Tubal ligation  2010  . Colonoscopy  2010  . Power port    . Lumbar laminectomy/decompression microdiscectomy  2007, 2008    Dr. Hal Neer x 2  . Uterine fibroid embolization  2008 or 2009  . Transthoracic echocardiogram   November 2014    Mild LVH, EF of roughly 50%. Pseudo-normal LV filling (grade 3 diastolic soft. Mild LA dilation. 1.3 x 1.3 cm  mass in right atrium.  . Cardiac mri  December 2014    1.9 x 1.6 cm mass in the lateral RA cavity. -- Most suggestive of thrombus--  calcified anndd  laammiinnaated suggestive of chronic.; EF from 50-4% with no scar. Trivial effusion   Interval History: her weight today is that she has bruising and occasional nosebleeds from being on ELIQUIS. She wanted to know how long she needs to be on it. For some reason my intention for three-month followup and that he had one-year followup and therefore she remains on anticoagulation. She has really not noticed much in the way of any rapid outpatient episodes since I last saw her. She's also noting that her energy levels using the costal blocker as well as beta blocker. Cardiovascular ROS:   No chest pain or shortness of breath with rest or exertion. No PND, orthopnea or edema. No lightheadedness, dizziness, weakness or syncope/near syncope.  No TIA/amaurosis fugax symptoms.  ROS: A comprehensive was performed. Review of Systems  Constitutional: Negative for malaise/fatigue.  HENT: Negative for nosebleeds.   Respiratory: Negative for cough, shortness of breath and wheezing.   Cardiovascular: Negative for palpitations and claudication.  Gastrointestinal: Negative for blood in stool and  melena.  Genitourinary: Negative for hematuria.  Neurological: Negative for dizziness and loss of consciousness.  Endo/Heme/Allergies: Bruises/bleeds easily.  Psychiatric/Behavioral: Negative.   All other systems reviewed and are negative.   Current Outpatient Prescriptions on File Prior to Visit  Medication Sig Dispense Refill  . apixaban (ELIQUIS) 5 MG TABS tablet Take 1 tablet (5 mg total) by mouth 2 (two) times daily. 60 tablet 2  . ascorbic acid (VITAMIN C) 250 MG tablet Take 500 mg by mouth daily.    [name] BIOTIN PO Take 1 tablet by mouth daily.    . Calcium Carbonate-Vitamin D (CALCIUM + D) 600-200 MG-UNIT TABS Take 2 tablets by mouth daily.      . cetirizine  (ZYRTEC) 10 MG tablet Take 10 mg by mouth daily.    [name] diltiazem (CARDIZEM CD) 180 MG 24 hr capsule Take 1 capsule (180 mg total) by mouth daily. 30 capsule 2  . furosemide (LASIX) 40 MG tablet Take 1 tablet (40 mg total) by mouth every other day. 30 tablet   . gabapentin (NEURONTIN) 600 MG tablet Take 600 mg by mouth at bedtime.    . lidocaine (LIDODERM) 5 %   5  . lisinopril (PRINIVIL,ZESTRIL) 5 MG tablet Take 1 tablet (5 mg total) by mouth 2 (two) times daily. 60 tablet 8  . Multiple Vitamins-Minerals (MULTIVITAMIN WITH MINERALS) tablet Take 1 tablet by mouth daily.      . nabumetone (RELAFEN) 500 MG tablet Take 500 mg by mouth 2 (two) times daily.      [name] NEXIUM 40 MG capsule TAKE ONE CAPSULE BY MOUTH EVERY DAY 30 capsule 0  . potassium chloride SA (K-DUR,KLOR-CON) 20 MEQ tablet Take 20 mEq by mouth every other day.     . tapentadol (NUCYNTA) 50 MG TABS tablet Take 50 mg by mouth every 6 (six) hours as needed.    [name] UNABLE TO FIND 1 tablet. i Cool supplement  Take 1 tablet daily for hot flashes    . vitamin E 400 UNIT capsule Take 400 Units by mouth daily.    . clobetasol ointment (TEMOVATE) 0.05 %   3  . triamcinolone cream (KENALOG) 0.1 %   1   No current facility-administered medications on file prior to visit.   Allergies  Allergen Reactions  . Adhesive [Tape] Hives  . Morphine Hives   History  Substance Use Topics  . Smoking status: Never Smoker   . Smokeless tobacco: Never Used  . Alcohol Use: No   Family History  Problem Relation Age of Onset  . Breast cancer Mother   . Cancer Maternal Grandmother     renal cancer   Wt Readings from Last 3 Encounters:  10/14/14 188 lb (85.276 kg)  06/17/14 187 lb (84.823 kg)  06/10/14 191 lb (86.637 kg)    PHYSICAL EXAM BP 112/86 mmHg  Pulse 63  Ht 5\' 6"  (1.676 m)  Wt 188 lb (85.276 kg)  BMI 30.36 kg/m2 General appearance: alert, cooperative, appears stated age, no distress, mildly obese and Healthy-appearing. Answered  questions appropriately. HEENT: [place]/AT, EOMI, MMM, anicteric sclera Neck: no adenopathy, no carotid bruit, no JVD and supple, symmetrical, trachea midline Lungs: CTAB, normal percussion bilaterally and Nonlabored; good air movement Heart: RRR, normal S1 and S2 with a soft S4 gallop. There is a soft systolic murmur heard right along the precordium. Nondisplaced PMI  Abdomen: soft, non-tender; bowel sounds normal; no masses, no organomegaly Extremities: No clubbing or cyanosis. Trace edema Pulses: 2+ and symmetric Neurologic: Alert  and oriented X 3, normal strength and tone. Normal symmetric reflexes. Normal coordination and gait; Pleasant mood and affect   Adult ECG Report  Rate: 63 ;  Rhythm: normal sinus rhythm and Nonspecific diffuse T wave flattening  Narrative Interpretation: stable EKG  Recent Labs:  Has not had any checks that she is aware of in some time now. She is not aware that anyone is check her lipids  ASSESSMENT / PLAN: Problem List Items Addressed This Visit    Abnormal echocardiogram (Chronic)    Finding of right atrial thrombus 2014. - Structure was confirmed to be likely chronic thrombus by MRI. Will recheck as noted above.      Relevant Orders   EKG 12-Lead   2D Echocardiogram with contrast   Essential hypertension, benign (Chronic)    Stable on calcium channel blocker with increased dose of ACE inhibitor.      Hyperlipidemia (Chronic)    No recent labs that I can see. She is not on any medications. Very poorly controlled 2013. Supposedly was monitored by her PCP. She states that she is due to have labs checked soon.      Left ventricular diastolic dysfunction - noted on echo (Chronic)    She is on introduction of ACE inhibitor and calcium channel blocker. Normal EF calcium blocker were fine for rate control. Reassess echo,  No clear etiology for why she would have such diastolic dysfunction. It was read as grade 3 diastolic dysfunction on echo and grade 2 on  another.      Palpitations (Chronic)    Likely PACs or PVCs. No more short bursts. Well controlled on calcium channel blocker. We did discuss vagal maneuvers.      Right atrial thrombus - Primary (Chronic)    She is now mellitus for over a year for what looked like in stable chronic thrombus in the right atrium. The most likely etiology for the thrombus was her PICC line that was placed for her chemotherapy in 2010.  At this point I thinkit is clearly chronic it still there is laminated on the MRI.  Plan: Recheck echo to clearly evaluate right atrial thrombus. I have asked that Definity contrast be used if it would help with evaluating the right atrium. If the thrombus appears to be stable, not mobile and laminated or not present, I think we can safely stop the Eliquis. Would simply start aspirin.      Relevant Orders   EKG 12-Lead   2D Echocardiogram with contrast   SOB (shortness of breath) on exertion    Notably improved. Does not really bother her nearly as much as before. She had significant diastolic dysfunction noted on her echocardiogram the past.  Recheck echocardiogram to reassess systolic and diastolic function as well as LV filling pressures and pulmonary pressures         Orders Placed This Encounter  Procedures  . EKG 12-Lead  . 2D Echocardiogram with contrast    Standing Status: Future     Number of Occurrences:      Standing Expiration Date: 10/14/2015    Order Specific Question:  Type of Echo    Answer:  Complete    Order Specific Question:  Where should this test be performed    Answer:  [facility]    Order Specific Question:  Reason for exam-Echo    Answer:  Other - See Comments Section   No orders of the defined types were placed in this encounter.  Followup: 1 yr    Leonie Man, M.D., M.S. Interventional Cardiologist   Pager # [phone]

## 2014-10-19 ENCOUNTER — Ambulatory Visit (HOSPITAL_COMMUNITY)
Admission: RE | Admit: 2014-10-19 | Discharge: 2014-10-19 | Disposition: A | Discharge status: Home / Self Care | Payer: PRIVATE HEALTH INSURANCE | Source: Ambulatory Visit | Attending: Family Medicine | Admitting: Family Medicine

## 2014-10-19 DIAGNOSIS — I10 Essential (primary) hypertension: Secondary | ICD-10-CM

## 2014-10-19 DIAGNOSIS — R0600 Dyspnea, unspecified: Secondary | ICD-10-CM | POA: Diagnosis not present

## 2014-10-19 DIAGNOSIS — I513 Intracardiac thrombosis, not elsewhere classified: Secondary | ICD-10-CM

## 2014-10-19 DIAGNOSIS — I213 ST elevation (STEMI) myocardial infarction of unspecified site: Secondary | ICD-10-CM | POA: Insufficient documentation

## 2014-10-19 DIAGNOSIS — R931 Abnormal findings on diagnostic imaging of heart and coronary circulation: Secondary | ICD-10-CM

## 2014-10-19 NOTE — Progress Notes (Signed)
2D Echocardiogram Complete.  10/19/2014   Advika Mclelland, RDCS  

## 2014-10-25 ENCOUNTER — Other Ambulatory Visit: Payer: Self-pay | Admitting: Cardiology

## 2014-10-25 NOTE — Telephone Encounter (Signed)
Rx(s) sent to pharmacy electronically.  

## 2014-10-27 ENCOUNTER — Ambulatory Visit
Admission: RE | Admit: 2014-10-27 | Discharge: 2014-10-27 | Disposition: A | Discharge status: Home / Self Care | Payer: PRIVATE HEALTH INSURANCE | Source: Ambulatory Visit | Attending: Obstetrics and Gynecology | Admitting: Obstetrics and Gynecology

## 2014-10-27 DIAGNOSIS — Z853 Personal history of malignant neoplasm of breast: Secondary | ICD-10-CM

## 2014-10-27 DIAGNOSIS — N641 Fat necrosis of breast: Secondary | ICD-10-CM | POA: Diagnosis not present

## 2014-11-19 IMAGING — CT CT ANGIO CHEST
2 of 9 series · 18 of 46 positions shown · IV contrast (APPLIED)
Comparison: Chest radiographs obtained yesterday and chest CT dated
06/05/2012.

CLINICAL DATA: Weakness. Shortness of breath. Chills. Previous
lumpectomy for breast cancer.

EXAM:
CT ANGIOGRAPHY CHEST WITH CONTRAST
TECHNIQUE: Multidetector CT imaging of the chest was performed using the
standard protocol during bolus administration of intravenous
contrast. Multiplanar CT image reconstructions including MIPs were
obtained to evaluate the vascular anatomy.
CONTRAST:  100mL OMNIPAQUE IOHEXOL 350 MG/ML SOLN

[Series 4: thins · axial · 0.61mm/px · z∈[+1354,+1548]mm · 15 of 220 slices shown]
[im 13/220  lung]
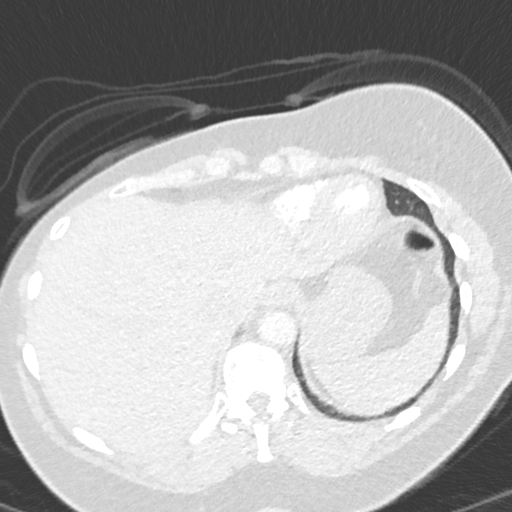
[im 26/220  soft-tissue]
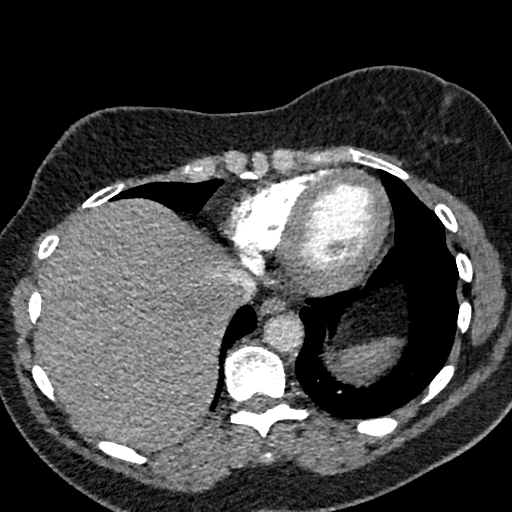
[im 39/220  lung]
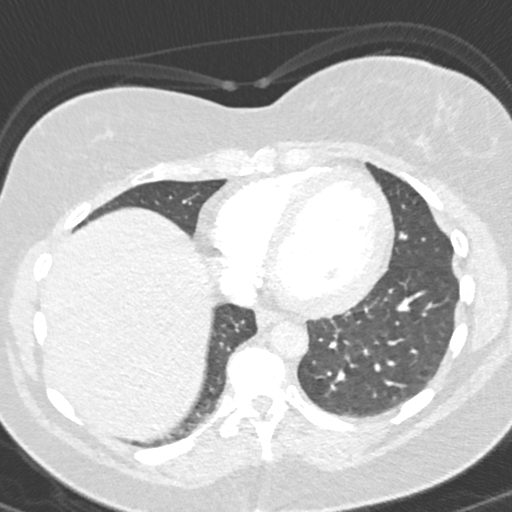
[im 52/220  soft-tissue]
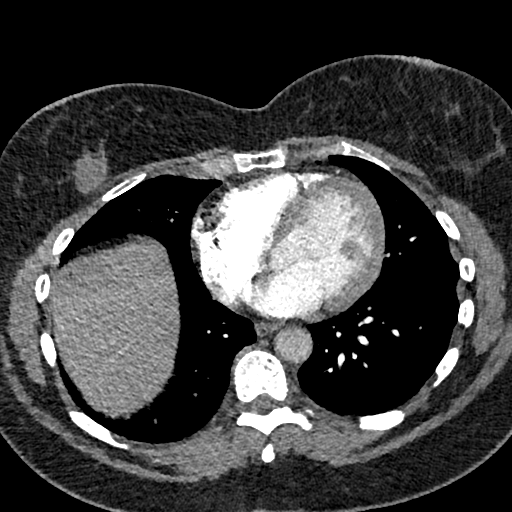
[im 65/220  lung]
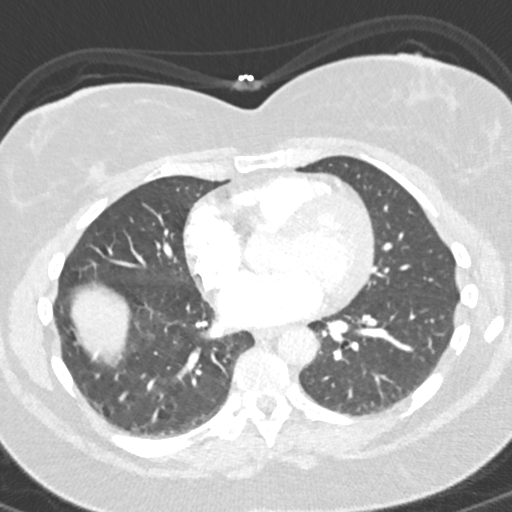
[im 78/220  soft-tissue]
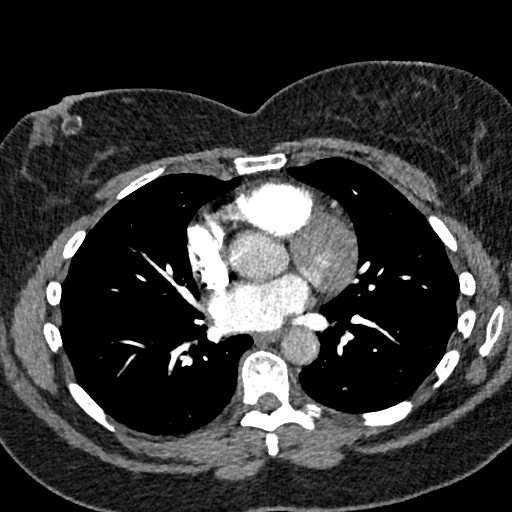
[im 91/220  lung]
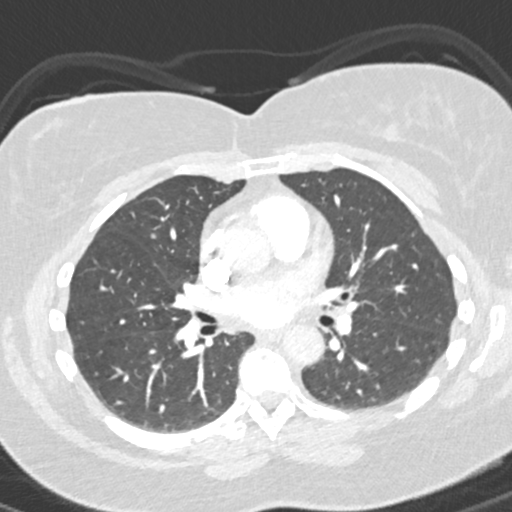
[im 116/220  soft-tissue]
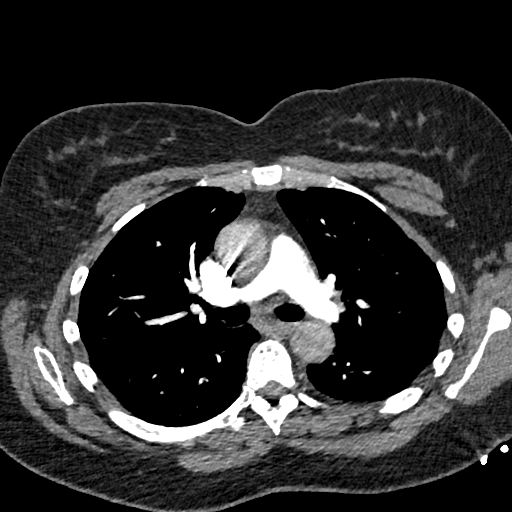
[im 129/220  lung]
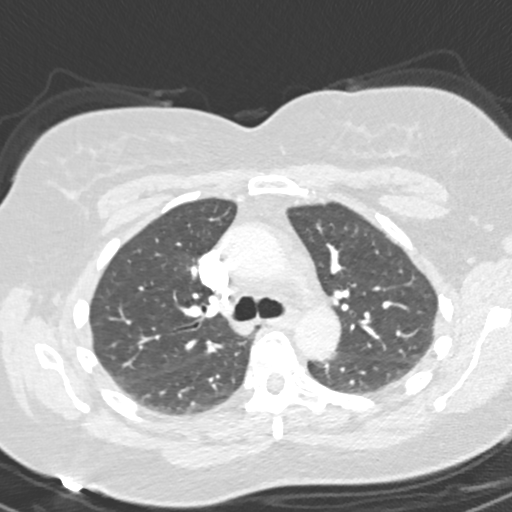
[im 142/220  soft-tissue]
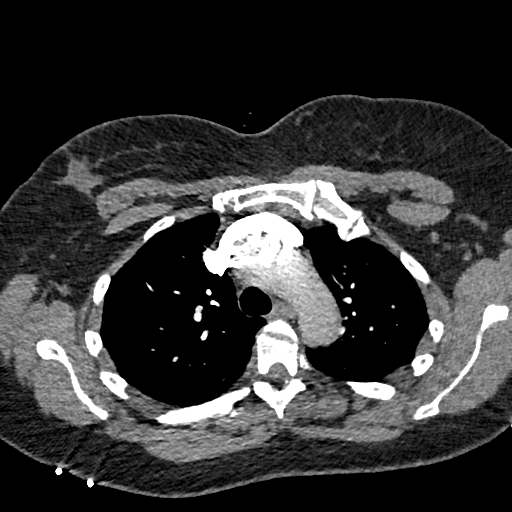
[im 155/220  lung]
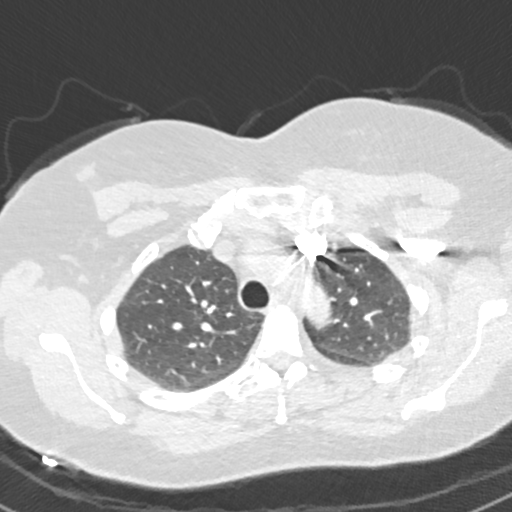
[im 168/220  soft-tissue]
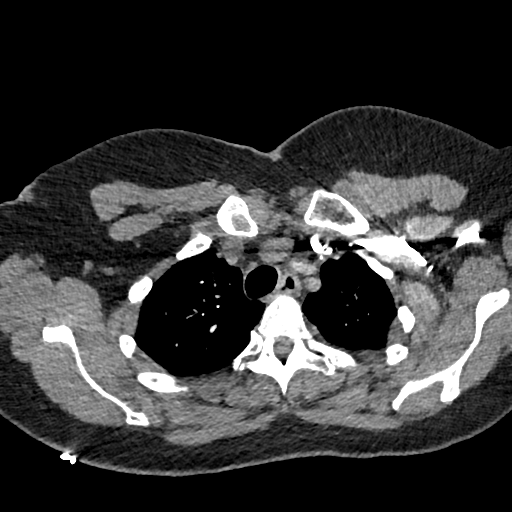
[im 181/220  lung]
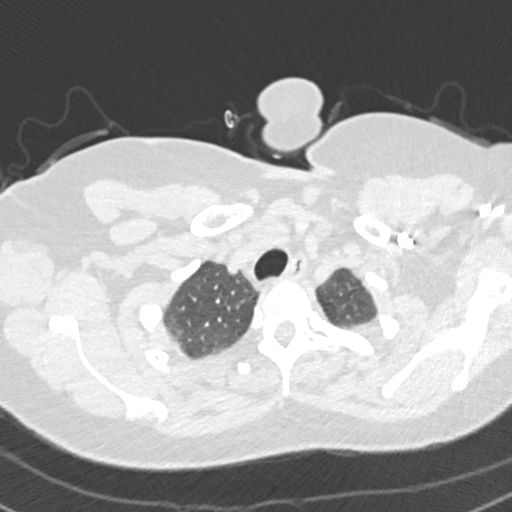
[im 194/220  soft-tissue]
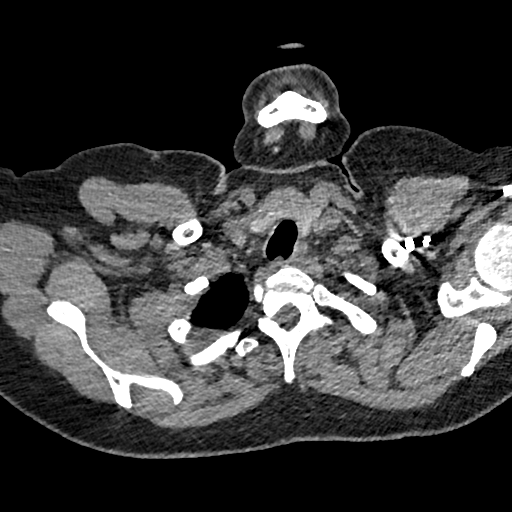
[im 207/220  lung]
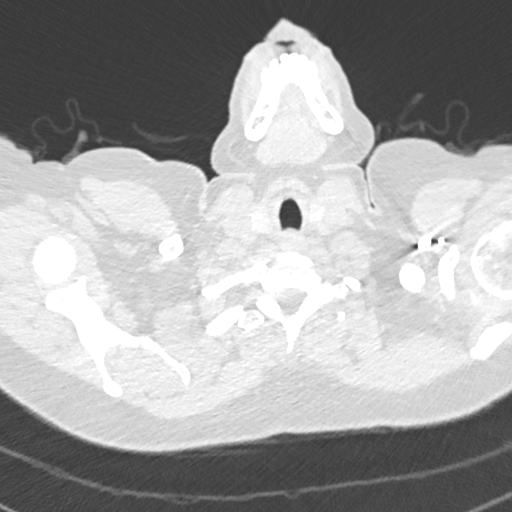

[Series 7: coronal mpr · coronal · 0.42mm/px · 3 of 151 slices shown]
[im 38/151  soft-tissue]
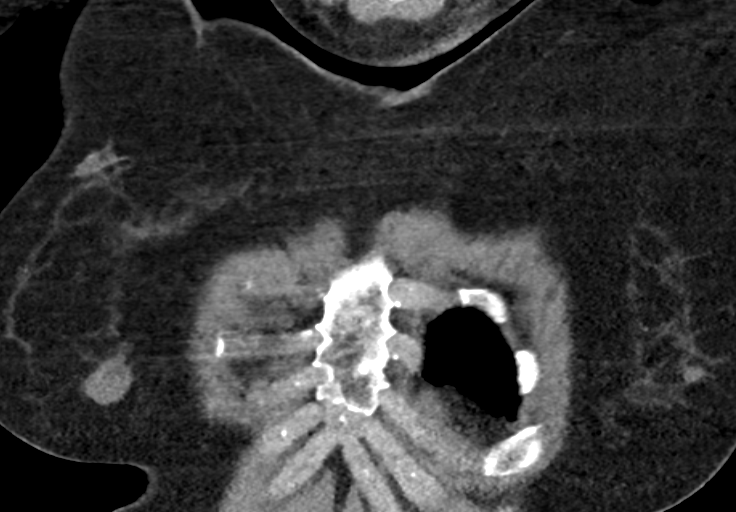
[im 76/151  soft-tissue]
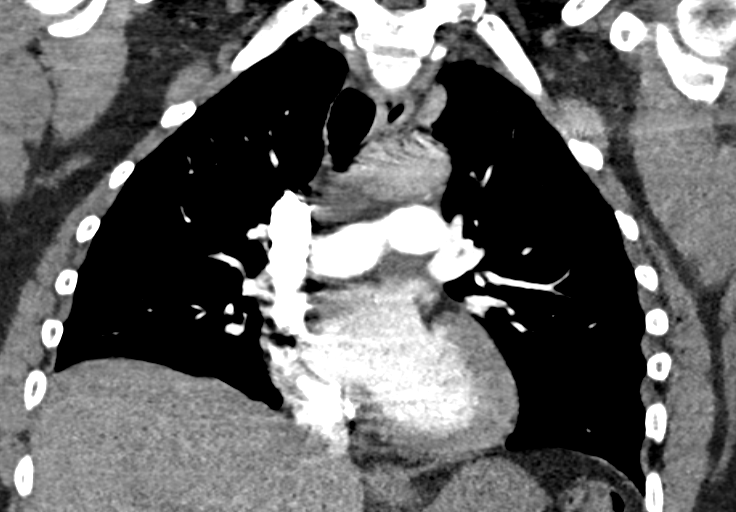
[im 113/151  soft-tissue]
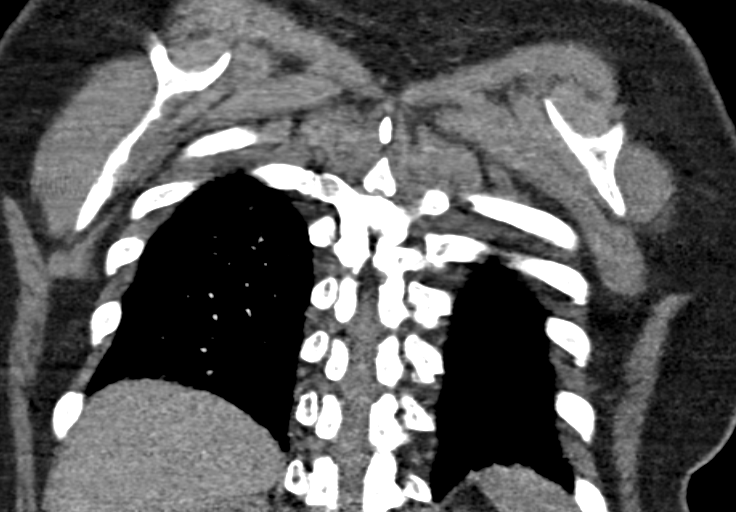

[18 of 46 positions shown; findings below may reference images not displayed]

FINDINGS: Normally opacified pulmonary arteries with no pulmonary arterial
filling defects seen. Clear lungs. No lung nodules or enlarged lymph
nodes. 1.4 x 1.2 cm oval low density mass with peripheral coarse
calcification in the subareolar region of the right breast,
compatible with fat necrosis. There is also a mass in the posterior
aspect of the inferior right breast, in the 6 o'clock position,
measuring 2.7 x 2.1 cm in maximum dimensions on image number 57.
There is a similar density at this location on 06/05/2012, measuring
2.4 x 2.0 cm at that time. This corresponds to the location of a
larger postoperative fluid collection on 07/28/2009. Mild thoracic
spine degenerative changes.

Review of the MIP images confirms the above findings.
IMPRESSION: 1. No pulmonary emboli or acute abnormality.
2. Right breast postsurgical changes with fat necrosis and organized
hematoma.

## 2014-11-23 ENCOUNTER — Other Ambulatory Visit: Payer: Self-pay | Admitting: Cardiology

## 2014-11-23 NOTE — Telephone Encounter (Signed)
Rx(s) sent to pharmacy electronically.  

## 2014-11-24 ENCOUNTER — Telehealth: Payer: Self-pay | Admitting: Cardiology

## 2014-11-24 MED ORDER — FUROSEMIDE 40 MG PO TABS: 40.0000 mg | ORAL_TABLET | ORAL | Status: DC

## 2014-11-24 NOTE — Telephone Encounter (Signed)
E-sent #[phone] with refills  x3  patnt aware

## 2014-11-24 NOTE — Telephone Encounter (Signed)
°  1. Which medications need to be refilled? Furosemide-new prescription  2. Which pharmacy is medication to be sent to?[phone]  3. Do they need a 30 day or 90 day supply? 30 and refills  4. Would they like a call back once the medication has been sent to the pharmacy? yes

## 2014-12-10 ENCOUNTER — Other Ambulatory Visit: Payer: Self-pay | Admitting: Cardiology

## 2014-12-15 ENCOUNTER — Other Ambulatory Visit: Payer: Self-pay | Admitting: *Deleted

## 2014-12-15 DIAGNOSIS — C50919 Malignant neoplasm of unspecified site of unspecified female breast: Secondary | ICD-10-CM

## 2014-12-16 ENCOUNTER — Telehealth: Payer: Self-pay | Admitting: Hematology

## 2014-12-16 ENCOUNTER — Encounter: Payer: Self-pay | Admitting: Hematology

## 2014-12-16 ENCOUNTER — Ambulatory Visit (HOSPITAL_BASED_OUTPATIENT_CLINIC_OR_DEPARTMENT_OTHER): Payer: PRIVATE HEALTH INSURANCE | Admitting: Hematology

## 2014-12-16 ENCOUNTER — Other Ambulatory Visit (HOSPITAL_BASED_OUTPATIENT_CLINIC_OR_DEPARTMENT_OTHER): Payer: PRIVATE HEALTH INSURANCE

## 2014-12-16 VITALS — BP 135/89 | HR 66 | Temp 98.1°F | Resp 19 | Ht 66.0 in | Wt 186.8 lb

## 2014-12-16 DIAGNOSIS — C50919 Malignant neoplasm of unspecified site of unspecified female breast: Secondary | ICD-10-CM

## 2014-12-16 DIAGNOSIS — Z853 Personal history of malignant neoplasm of breast: Secondary | ICD-10-CM

## 2014-12-16 LAB — CBC WITH DIFFERENTIAL/PLATELET
BASO%: 1 % (ref 0.0–2.0)
BASOS ABS: 0.1 10*3/uL (ref 0.0–0.1)
EOS%: 1.4 % (ref 0.0–7.0)
Eosinophils Absolute: 0.1 10*3/uL (ref 0.0–0.5)
HCT: 40.5 % (ref 34.8–46.6)
HEMOGLOBIN: 13.6 g/dL (ref 11.6–15.9)
LYMPH%: 36.8 % (ref 14.0–49.7)
MCH: 30.5 pg (ref 25.1–34.0)
MCHC: 33.5 g/dL (ref 31.5–36.0)
MCV: 91.1 fL (ref 79.5–101.0)
MONO#: 0.3 10*3/uL (ref 0.1–0.9)
MONO%: 5.8 % (ref 0.0–14.0)
NEUT#: 3.1 10*3/uL (ref 1.5–6.5)
NEUT%: 55 % (ref 38.4–76.8)
Platelets: 265 10*3/uL (ref 145–400)
RBC: 4.44 10*6/uL (ref 3.70–5.45)
RDW: 12.6 % (ref 11.2–14.5)
WBC: 5.5 10*3/uL (ref 3.9–10.3)
lymph#: 2 10*3/uL (ref 0.9–3.3)

## 2014-12-16 LAB — COMPREHENSIVE METABOLIC PANEL (CC13)
ALT: 16 U/L (ref 0–55)
AST: 19 U/L (ref 5–34)
Albumin: 3.7 g/dL (ref 3.5–5.0)
Alkaline Phosphatase: 83 U/L (ref 40–150)
Anion Gap: 10 mEq/L (ref 3–11)
BUN: 9.2 mg/dL (ref 7.0–26.0)
CHLORIDE: 106 meq/L (ref 98–109)
CO2: 25 meq/L (ref 22–29)
Calcium: 8.9 mg/dL (ref 8.4–10.4)
Creatinine: 0.8 mg/dL (ref 0.6–1.1)
EGFR: >90 mL/min/{1.73_m2} (ref >90)
GLUCOSE: 109 mg/dL (ref 70–140)
Potassium: 3.8 mEq/L (ref 3.5–5.1)
SODIUM: 141 meq/L (ref 136–145)
TOTAL PROTEIN: 7.5 g/dL (ref 6.4–8.3)
Total Bilirubin: 0.34 mg/dL (ref 0.20–1.20)

## 2014-12-16 NOTE — Telephone Encounter (Signed)
Gave and printed appt sched and avs fo rpt for May 2017 °

## 2014-12-16 NOTE — Progress Notes (Signed)
[name]  [phone] [phone]) [phone]     ID: Olivia [name] OB: March 20, 1966  MR#: [id number]  YDX#:[id number]  PCP: [name], MD GYN:  Dr. Garwin Brothers SU: Dr. Erroll Luna OTHER MD: Dr. Wyline Mood oncology  CHIEF COMPLAINT:  49 year old [name] woman with right sided T2 N0, stage IIA, grade III, invasive ductal carcinoma, ER negative, PR negative, Ki-67 77%, HER-2/neu negative.  BREAST CANCER HISTORY:  #1 The patient noted a new palpable mass in her right breast and underwent a diagnostic mammogram in January, 2010.  Biopsy was consistent with invasive ductal carcinoma.  A MRI of the breasts revealed a 2.4 cm right breast mass and a satellite lesion.  At 6 o'clock however, a round oval mass was seen on MRI and a biopsy determined it was not malignant.  Patient underwent a right breast lumpectomy on 09/17/2008 and a 2.3 and 1.7 cm grade III invasive ductal carcinoma was removed.  One sentinel node was negative.  The tumor was ER negative, PR negative, Ki-67 of 77%, HER-2/neu negative.    #2 Patient underwent a right breast lumpectomy on 09/17/2008 and a 2.3 and 1.7 cm grade III invasive ductal carcinoma was removed.  One sentinel node was negative.  The tumor was ER negative, PR negative, Ki-67 of 77%, HER-2/neu negative.    #3. Patient then received adjuvant chemotherapy consisting of Adriamycin Cytoxan given dose dense x4 cycles followed by Taxotere (at her best recollection) which she completed in September 2010.   #4 Patient underwent adjuvant radiation therapy that completed in November 2010.  CURRENT THERAPY: observation  INTERVAL HISTORY:    [name] returns for follow-up of her breast cancer. She was previously under Dr. Laurelyn Sickle care, who has left the practice.   She is doing well overall. She has mild fatigue, no significant dyspnea, or other new symptoms. She has chronic back pain, which is stable. No other new pain. She has  been following with her cardiologist, and her recent echo showed improvement in her heart function. She has good appetite and energy level, remains to be active and functions well well.  REVIEW OF SYSTEMS:  A 10 point review of systems was conducted and is otherwise negative except for what is noted above.     PAST MEDICAL HISTORY: Past Medical History  Diagnosis Date  . Colon polyp   . Obesity   . Uterine fibroid     s/p embolization  . Lumbar degenerative disc disease   . History of breast cancer 2010    invasive ductal R breast; s/p lumpectomy, radiation and chemo  . Obesity   . Breast cancer, stage 1   . GERD (gastroesophageal reflux disease)   . Right atrial thrombus Nov 2014    Echo: Mild LVH, EF of roughly 50%. Pseudo-normal LV filling (grade 3 diastolic soft. Mild LA dilation.   . H/O diastolic dysfunction nov 2014    1.3 x 1.3 cm mass in RA on Echo -->Cardiac MRI 12/'04: 1.9 x 1.6 cm mass in the lateral RA cavity. -- Most suggestive of thrombus--  calcified and  lamiina and ted suggestive of chronic.; EF from 50-4% with no scar.    PAST SURGICAL HISTORY: Past Surgical History  Procedure Laterality Date  . Breast lumpectomy  2010    right  . Tubal ligation  2010  . Colonoscopy  2010  . Power port    . Lumbar laminectomy/decompression microdiscectomy  2007, 2008    Dr. Hal Neer x  2  . Uterine fibroid embolization  2008 or 2009  . Transthoracic echocardiogram   November 2014    Mild LVH, EF of roughly 50%. Pseudo-normal LV filling (grade 3 diastolic soft. Mild LA dilation. 1.3 x 1.3 cm mass in right atrium.  . Cardiac mri  December 2014    1.9 x 1.6 cm mass in the lateral RA cavity. -- Most suggestive of thrombus--  calcified anndd  laammiinnaated suggestive of chronic.; EF from 50-4% with no scar. Trivial effusion    FAMILY HISTORY Family History  Problem Relation Age of Onset  . Breast cancer Mother   . Cancer Maternal Grandmother     renal cancer     GYNECOLOGIC HISTORY:  Menarche at age 64, G31 P1, No history of estrogen replacement therapy, no history of abnormal pap smears, or sexually transmitted infections  SOCIAL HISTORY:  The patient is married to her husband [name] for 10 years.  Lives with him in a two story house.  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    ADVANCED DIRECTIVES: Not in place.     HEALTH MAINTENANCE: History  Substance Use Topics  . Smoking status: Never Smoker   . Smokeless tobacco: Never Used  . Alcohol Use: No    Mammogram: 10/21/2013 Colonoscopy: 06/2009  Bone Density Scan: 10/2011  Pap Smear: 11/2011, scheduled in June, 2015 Eye Exam: 01/2013 Vitamin D Level: previously normal  Lipid Panel: unknown   Allergies  Allergen Reactions  . Adhesive [Tape] Hives  . Morphine Hives    Current Outpatient Prescriptions  Medication Sig Dispense Refill  . ascorbic acid (VITAMIN C) 250 MG tablet Take 500 mg by mouth daily.    [name] BIOTIN PO Take 1 tablet by mouth daily.    . Calcium Carbonate-Vitamin D (CALCIUM + D) 600-200 MG-UNIT TABS Take 2 tablets by mouth daily.      . cetirizine (ZYRTEC) 10 MG tablet Take 10 mg by mouth daily.    . clobetasol ointment (TEMOVATE) 0.05 %   3  . diltiazem (CARDIZEM CD) 180 MG 24 hr capsule TAKE 1 CAPSULE (180 MG TOTAL) BY MOUTH DAILY. 30 capsule 11  . ELIQUIS 5 MG TABS tablet TAKE 1 TABLET BY MOUTH TWICE A DAY 60 tablet 6  . furosemide (LASIX) 40 MG tablet Take 1 tablet (40 mg total) by mouth every other day. 30 tablet 6  . gabapentin (NEURONTIN) 600 MG tablet Take 600 mg by mouth at bedtime.    . lidocaine (LIDODERM) 5 %   5  . lisinopril (PRINIVIL,ZESTRIL) 5 MG tablet Take 1 tablet (5 mg total) by mouth 2 (two) times daily. 60 tablet 8  . lisinopril (PRINIVIL,ZESTRIL) 5 MG tablet TAKE 2 TABLETS (10 MG TOTAL) BY MOUTH DAILY. 60 tablet 10  . Multiple Vitamins-Minerals (MULTIVITAMIN WITH MINERALS)  tablet Take 1 tablet by mouth daily.      . nabumetone (RELAFEN) 500 MG tablet Take 500 mg by mouth 2 (two) times daily.      [name] NEXIUM 40 MG capsule TAKE ONE CAPSULE BY MOUTH EVERY DAY 30 capsule 0  . potassium chloride SA (K-DUR,KLOR-CON) 20 MEQ tablet Take 20 mEq by mouth every other day.     . tapentadol (NUCYNTA) 50 MG TABS tablet Take 50 mg by mouth every 6 (six) hours as needed.    . triamcinolone cream (KENALOG) 0.1 %   1  . UNABLE TO FIND 1 tablet. i Cool  supplement  Take 1 tablet daily for hot flashes    . vitamin E 400 UNIT capsule Take 400 Units by mouth daily.     No current facility-administered medications for this visit.    OBJECTIVE: There were no vitals filed for this visit.   There is no weight on file to calculate BMI.     GENERAL: Patient is a well appearing female in no acute distress HEENT:  Sclerae anicteric.  Oropharynx clear and moist. No ulcerations or evidence of oropharyngeal candidiasis. Neck is supple.  NODES:  No cervical, supraclavicular, or axillary lymphadenopathy palpated.  BREAST EXAM:  Right breast without nodules, masses, left breast without nodules or masses, benign bilateral breast exam.  LUNGS:  Clear to auscultation bilaterally.  No wheezes or rhonchi. HEART:  Regular rate and rhythm. No murmur appreciated. ABDOMEN:  Soft, nontender.  Positive, normoactive bowel sounds. No organomegaly palpated. MSK:  No focal spinal tenderness to palpation. Full range of motion bilaterally in the upper extremities. EXTREMITIES:  No peripheral edema.   SKIN:  Clear with no obvious rashes or skin changes. No nail dyscrasia. NEURO:  Nonfocal. Well oriented.  Appropriate affect. ECOG FS:1 - Symptomatic but completely ambulatory      LAB RESULTS: CBC Latest Ref Rng 12/16/2014 06/10/2014 12/03/2013  WBC 3.9 - 10.3 10e3/uL 5.5 7.6 6.8  Hemoglobin 11.6 - 15.9 g/dL 13.6 13.2 13.3  Hematocrit 34.8 - 46.6 % 40.5 39.8 39.8  Platelets 145 - 400 10e3/uL 265 284 287     CMP Latest Ref Rng 12/16/2014 06/10/2014 12/03/2013  Glucose 70 - 140 mg/dl 109 88 98  BUN 7.0 - 26.0 mg/dL 9.2 9.9 11.1  Creatinine 0.6 - 1.1 mg/dL 0.8 0.9 0.9  Sodium 136 - 145 mEq/L 141 139 141  Potassium 3.5 - 5.1 mEq/L 3.8 3.9 4.2  Chloride 96 - 112 mEq/L - - -  CO2 22 - 29 mEq/L 25 29 27   Calcium 8.4 - 10.4 mg/dL 8.9 9.5 9.6  Total Protein 6.4 - 8.3 g/dL 7.5 7.8 7.7  Albumin 3.3 - 5.5 g/dL - - -  Total Bilirubin 0.20 - 1.20 mg/dL 0.34 0.20 0.35  Alkaline Phos 40 - 150 U/L 83 79 85  AST 5 - 34 U/L 19 18 19   ALT 0 - 55 U/L 16 13 13      STUDIES: Brain MRI w wo contrast 2014-07-14 IMPRESSION: No acute abnormality and no change from 2010.  MAMMOGRAM 10/27/2014  IMPRESSION: No evidence of malignancy.  RECOMMENDATION: Bilateral diagnostic mammogram in 1 year.    ASSESSMENT: 49 y.o.  [place], [place] woman   1. right sided T2 N0, stage IIA, grade III, invasive ductal carcinoma, ER negative, PR negative, Ki-67 77%, HER-2/neu negative. -She is now 6 years out of her initial diagnosis. She is clinically doing very well, her physical exam and recent mammogram showed no evidence of recurrence. -We discussed that her risk of cancer recurrence is much less now. -We'll continue surveillance, with annual screening mammogram and a physical exam. -I encouraged her to have healthy diet and exercise regularly. -Calcium and vitamin D for bone health. She is postmenopausal now.  2. 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 -Her recent echo showed EF 50-55%. -She is clinically doing well, we'll continue follow-up with cardiology.  Follow-up: I'll see her back in one year with lab.   I spent 25 minutes counseling the patient face to face.  The total time spent in the appointment was  30 minutes.  Truitt Merle   12/16/2014 8:20 AM

## 2015-03-17 DIAGNOSIS — M9981 Other biomechanical lesions of cervical region: Secondary | ICD-10-CM | POA: Diagnosis not present

## 2015-03-28 ENCOUNTER — Encounter: Payer: Self-pay | Admitting: Medical

## 2015-03-28 ENCOUNTER — Ambulatory Visit (INDEPENDENT_AMBULATORY_CARE_PROVIDER_SITE_OTHER): Payer: PRIVATE HEALTH INSURANCE | Admitting: Medical

## 2015-03-28 VITALS — BP 110/70 | HR 80 | Temp 98.5°F | Resp 14 | Ht 65.0 in | Wt 182.4 lb

## 2015-03-28 DIAGNOSIS — N63 Unspecified lump in unspecified breast: Secondary | ICD-10-CM

## 2015-03-28 DIAGNOSIS — Z853 Personal history of malignant neoplasm of breast: Secondary | ICD-10-CM

## 2015-03-28 DIAGNOSIS — R222 Localized swelling, mass and lump, trunk: Secondary | ICD-10-CM | POA: Insufficient documentation

## 2015-03-28 DIAGNOSIS — R0602 Shortness of breath: Secondary | ICD-10-CM

## 2015-03-28 LAB — COMPREHENSIVE METABOLIC PANEL
ALK PHOS: 73 U/L (ref 33–115)
ALT: 15 U/L (ref 6–29)
AST: 16 U/L (ref 10–35)
Albumin: 4 g/dL (ref 3.6–5.1)
BUN: 9 mg/dL (ref 7–25)
CALCIUM: 9.4 mg/dL (ref 8.6–10.2)
CO2: 27 mmol/L (ref 20–31)
Chloride: 104 mmol/L (ref 98–110)
Creat: 0.84 mg/dL (ref 0.50–1.10)
GLUCOSE: 85 mg/dL (ref 65–99)
POTASSIUM: 3.6 mmol/L (ref 3.5–5.3)
Sodium: 140 mmol/L (ref 135–146)
Total Bilirubin: 0.3 mg/dL (ref 0.2–1.2)
Total Protein: 6.9 g/dL (ref 6.1–8.1)

## 2015-03-28 LAB — CBC WITH DIFFERENTIAL/PLATELET
BASOS ABS: 0 10*3/uL (ref 0.0–0.1)
Basophils Relative: 0 % (ref 0–1)
EOS PCT: 1 % (ref 0–5)
Eosinophils Absolute: 0.1 10*3/uL (ref 0.0–0.7)
HEMATOCRIT: 39 % (ref 36.0–46.0)
Hemoglobin: 13.5 g/dL (ref 12.0–15.0)
LYMPHS PCT: 45 % (ref 12–46)
Lymphs Abs: 3.2 10*3/uL (ref 0.7–4.0)
MCH: 31 pg (ref 26.0–34.0)
MCHC: 34.6 g/dL (ref 30.0–36.0)
MCV: 89.7 fL (ref 78.0–100.0)
MONO ABS: 0.4 10*3/uL (ref 0.1–1.0)
MONOS PCT: 6 % (ref 3–12)
MPV: 10.1 fL (ref 8.6–12.4)
Neutro Abs: 3.4 10*3/uL (ref 1.7–7.7)
Neutrophils Relative %: 48 % (ref 43–77)
Platelets: 290 10*3/uL (ref 150–400)
RBC: 4.35 MIL/uL (ref 3.87–5.11)
RDW: 12.9 % (ref 11.5–15.5)
WBC: 7 10*3/uL (ref 4.0–10.5)

## 2015-03-28 LAB — SEDIMENTATION RATE: Sed Rate: 21 mm/hr — ABNORMAL HIGH (ref 0–20)

## 2015-03-28 NOTE — Progress Notes (Signed)
Subjective: Chief Complaint  Patient presents with  . left upper abdominal pain    pt has a golf ball sized knot in upper left side near rib cage where bra is. feels it is causing back pain. pain scale is a 7/10. thinks it is affecting her breathing.   Olivia [name] is a 49yo AA female lifetime nonsmoker with hx/o right breast cancer with c/o lump of chest wall.   She notes having some back pain on the left mid to upper back about a month ago, and one particular day had trouble getting out of bed due to the back pain.  Of note, she is on chronic narocitc pain medication for low back pain, had MRI C spine 2 weeks ago, but the pain was significant despite being on this medication.  Then a week or so later after having worse back pain, she noticed a knot in her left rib cage under the left breast.  She is worried about this given her hx/o breast cancer.  Breast cancer diagnosis was 6 years ago, ended up having lumpectomy, radiation and chemotherapy. She also has an earlier benign calcification removed from left breast.   She is checking breast regularly, denies noticing any new breast lesions.  She sees oncology in routine f/u, sees Dr. Brien Few for pain management.  She has felt some SOB she attributes to the back pain.  denies fever, night sweats, weight loss, nausea, vomiting, bowel changes, diarrhea, URI symptoms, urinary symptoms, no other lumps or lymph nodes swollen.  No other aggravating or relieving factors. No other complaint.  Past Medical History  Diagnosis Date  . Colon polyp   . Obesity   . Uterine fibroid     s/p embolization  . Lumbar degenerative disc disease   . History of breast cancer 2010    invasive ductal R breast; s/p lumpectomy, radiation and chemo  . Obesity   . Breast cancer, stage 1   . GERD (gastroesophageal reflux disease)   . Right atrial thrombus Nov 2014    Echo: Mild LVH, EF of roughly 50%. Pseudo-normal LV filling (grade 3 diastolic soft. Mild LA dilation.   . H/O  diastolic dysfunction nov 2014    1.3 x 1.3 cm mass in RA on Echo -->Cardiac MRI 12/'04: 1.9 x 1.6 cm mass in the lateral RA cavity. -- Most suggestive of thrombus--  calcified and  lamiina and ted suggestive of chronic.; EF from 50-4% with no scar.   ROS as in subjective otherwise   Objective: BP 110/70 mmHg  Pulse 80  Temp(Src) 98.5 F (36.9 C) (Oral)  Resp 14  Ht 5\' 5"  (1.651 m)  Wt 182 lb 6.4 oz (82.736 kg)  BMI 30.35 kg/m2  General appearance: alert, no distress, WD/WN, AA female There is a bony prominence of left 8th and 10th ribs anteriorly mid clavicular line that are tender and similar bony prominence of left posterior 5th/6th rib region mid clavicular line not particularly tender.   Breasts: linear surgical scars present of left breast 5 o'clock, right breast surgical scars 7 o'clock and 11 o'clock, upper right chest wall surgical scar from prior port a cath removal.  Left breast at 5 o'clock just lateral to areola with 3mm x 49mm linear lump, left lateral breast at 3 o'clock with small few mm diameter density, not formerly palpable per patient, right breast 6 o'clock with 62mm round nodule likely referenced as benign stable seroma on prior mammogram.  No other lump or skin changes,  no axillary lymphadenopathy.  Exam chaperoned by nurse.  Neck: supple, no lymphadenopathy, no thyromegaly, no masses Heart: RRR, normal S1, S2, no murmurs Lungs: right lung fields seem somewhat decreased compared to left, but no wheezes, rhonchi, or rales Abdomen: +bs, soft, non tender, non distended, no masses, no hepatomegaly, no splenomegaly Back: nontender otherwise Pulses: 2+ symmetric, upper and lower extremities, normal cap refill Ext: no edema    Assessment: Encounter Diagnoses  Name Primary?  . Mass of chest wall, left Yes  . Breast lump in female   . Personal history of malignant neoplasm of breast     Plan: Etiology unclear, need to rule out serious causes of the bony prominences  given her history.  Labs today, Chest CT.  Reviewed recent MRI from pain management that was of C-spine, non contributory in this issue today.  F/u pending results, may end up needing diagnostic mammogram left.

## 2015-03-29 NOTE — Progress Notes (Signed)
I have gotten auth for ct auth# [id number]

## 2015-03-30 ENCOUNTER — Telehealth: Payer: Self-pay

## 2015-03-30 NOTE — Telephone Encounter (Signed)
LEFT MESSAGE ON PT CELL AND HOME # [place] [facility] [place] 04/01/15 @ 2:15 AUTH# [id number] 03/29/15 TO 04/29/15

## 2015-04-01 ENCOUNTER — Ambulatory Visit
Admission: RE | Admit: 2015-04-01 | Discharge: 2015-04-01 | Disposition: A | Discharge status: Home / Self Care | Payer: PRIVATE HEALTH INSURANCE | Source: Ambulatory Visit | Attending: Medical | Admitting: Medical

## 2015-04-01 DIAGNOSIS — R222 Localized swelling, mass and lump, trunk: Secondary | ICD-10-CM

## 2015-04-01 DIAGNOSIS — Z853 Personal history of malignant neoplasm of breast: Secondary | ICD-10-CM

## 2015-04-01 DIAGNOSIS — N63 Unspecified lump in unspecified breast: Secondary | ICD-10-CM

## 2015-04-01 DIAGNOSIS — R0602 Shortness of breath: Secondary | ICD-10-CM

## 2015-04-01 DIAGNOSIS — Z9889 Other specified postprocedural states: Secondary | ICD-10-CM | POA: Diagnosis not present

## 2015-04-01 MED ORDER — IOPAMIDOL (ISOVUE-300) INJECTION 61%
75.0000 mL | Freq: Once | INTRAVENOUS | Status: AC | PRN
  Administered 2015-04-01: 75 mL via INTRAVENOUS

## 2015-04-08 ENCOUNTER — Encounter: Payer: Self-pay | Admitting: Medical

## 2015-04-15 ENCOUNTER — Telehealth: Payer: Self-pay | Admitting: *Deleted

## 2015-04-15 ENCOUNTER — Other Ambulatory Visit: Payer: Self-pay | Admitting: Hematology

## 2015-04-15 DIAGNOSIS — C50919 Malignant neoplasm of unspecified site of unspecified female breast: Secondary | ICD-10-CM

## 2015-04-15 NOTE — Telephone Encounter (Signed)
Please let her know that I have ordered for her, and she should get a call regarding her schedule within a week.   [name]  04/15/2015

## 2015-04-15 NOTE — Telephone Encounter (Signed)
Patient called and states that Dr. Burr Medico and her PCP decided that she needed a bone scan.  Dr. Burr Medico was to order.  She has not heard anything.  Let her know I would send Dr. Burr Medico a note to order her bone scan.

## 2015-04-18 ENCOUNTER — Telehealth: Payer: Self-pay | Admitting: Hematology

## 2015-04-18 NOTE — Telephone Encounter (Signed)
per pof to sch pt appt DEXA-cld to sch [facility] and was told patient just had 01/12/15-sent Dr Burr Medico email to advise per ionsurance pt can only have 1 q 2 years unless on steroid and patient needs to be monitored.Will wait for reply before sch-order put in as a screening.-spoke with [name]

## 2015-04-21 ENCOUNTER — Other Ambulatory Visit: Payer: Self-pay | Admitting: Hematology

## 2015-04-22 ENCOUNTER — Telehealth: Payer: Self-pay | Admitting: Hematology

## 2015-04-22 ENCOUNTER — Ambulatory Visit (HOSPITAL_BASED_OUTPATIENT_CLINIC_OR_DEPARTMENT_OTHER): Payer: PRIVATE HEALTH INSURANCE | Admitting: Hematology

## 2015-04-22 ENCOUNTER — Ambulatory Visit: Payer: PRIVATE HEALTH INSURANCE | Admitting: Hematology

## 2015-04-22 ENCOUNTER — Encounter: Payer: Self-pay | Admitting: Hematology

## 2015-04-22 ENCOUNTER — Other Ambulatory Visit: Payer: PRIVATE HEALTH INSURANCE

## 2015-04-22 VITALS — BP 118/81 | HR 82 | Temp 98.0°F | Resp 18 | Ht 65.0 in | Wt 181.2 lb

## 2015-04-22 DIAGNOSIS — C50919 Malignant neoplasm of unspecified site of unspecified female breast: Secondary | ICD-10-CM

## 2015-04-22 NOTE — Progress Notes (Signed)
Olivia [name]  [phone] [phone]) [phone]     ID: Olivia [name] OB: 1965/10/05  MR#: [id number]  LSL#:[id number]  PCP: Olivia Ports, MD GYN:  Dr. Garwin [name] SU: Dr. Erroll [name] OTHER MD: Dr. Wyline [name] oncology  CHIEF COMPLAINT:  49 year old [place] woman with right sided T2 N0, stage IIA, grade III, invasive ductal carcinoma, ER negative, PR negative, Ki-67 77%, HER-2/neu negative.  BREAST CANCER HISTORY:  #1 The patient noted a new palpable mass in her right breast and underwent a diagnostic mammogram in January, 2010.  Biopsy was consistent with invasive ductal carcinoma.  A MRI of the breasts revealed a 2.4 cm right breast mass and a satellite lesion.  At 6 o'clock however, a round oval mass was seen on MRI and a biopsy determined it was not malignant.  Patient underwent a right breast lumpectomy on 09/17/2008 and a 2.3 and 1.7 cm grade III invasive ductal carcinoma was removed.  One sentinel node was negative.  The tumor was ER negative, PR negative, Ki-67 of 77%, HER-2/neu negative.    #2 Patient underwent a right breast lumpectomy on 09/17/2008 and a 2.3 and 1.7 cm grade III invasive ductal carcinoma was removed.  One sentinel node was negative.  The tumor was ER negative, PR negative, Ki-67 of 77%, HER-2/neu negative.    #3. Patient then received adjuvant chemotherapy consisting of Adriamycin Cytoxan given dose dense x4 cycles followed by Taxotere (at her best recollection) which she completed in September 2010.   #4 Patient underwent adjuvant radiation therapy that completed in November 2010.  CURRENT THERAPY: observation  INTERVAL HISTORY:   Olivia [name] requested to be seen for her left side chest pain. This has been going on for the past months, she describes as dull deep pain, at the low lateral chest wall, persistent, with intermittent flare up to severe pain, 8-9/10, and she has today down. The pain does not change much with position  or breathing, no dyspnea, nausea, abdominal pain, diarrhea, or other associated symptoms. She also noticed pretreated left rib cage, she feels is a nodule in her rib. She was initially everted by her pain specialist, then saw her primary care physician, who ordered a CT chest which was negative.  REVIEW OF SYSTEMS:  A 10 point review of systems was conducted and is otherwise negative except for what is noted above.     PAST MEDICAL HISTORY: Past Medical History  Diagnosis Date  . Colon polyp   . Obesity   . Uterine fibroid     s/p embolization  . Lumbar degenerative disc disease   . History of breast cancer 2010    invasive ductal R breast; s/p lumpectomy, radiation and chemo  . Obesity   . Breast cancer, stage 1   . GERD (gastroesophageal reflux disease)   . Right atrial thrombus Nov 2014    Echo: Mild LVH, EF of roughly 50%. Pseudo-normal LV filling (grade 3 diastolic soft. Mild LA dilation.   . H/O diastolic dysfunction nov 2014    1.3 x 1.3 cm mass in RA on Echo -->Cardiac MRI 12/'04: 1.9 x 1.6 cm mass in the lateral RA cavity. -- Most suggestive of thrombus--  calcified and  lamiina and ted suggestive of chronic.; EF from 50-4% with no scar.    PAST SURGICAL HISTORY: Past Surgical History  Procedure Laterality Date  . Breast lumpectomy  2010    right  . Tubal ligation  2010  . Colonoscopy  2010  .  Power port    . Lumbar laminectomy/decompression microdiscectomy  2007, 2008    Dr. Hal Neer x 2  . Uterine fibroid embolization  2008 or 2009  . Transthoracic echocardiogram   November 2014    Mild LVH, EF of roughly 50%. Pseudo-normal LV filling (grade 3 diastolic soft. Mild LA dilation. 1.3 x 1.3 cm mass in right atrium.  . Cardiac mri  December 2014    1.9 x 1.6 cm mass in the lateral RA cavity. -- Most suggestive of thrombus--  calcified anndd  laammiinnaated suggestive of chronic.; EF from 50-4% with no scar. Trivial effusion    FAMILY HISTORY Family History  Problem  Relation Age of Onset  . Breast cancer Mother   . Cancer Maternal Grandmother     renal cancer    GYNECOLOGIC HISTORY:  Menarche at age 51, G4 P1, No history of estrogen replacement therapy, no history of abnormal pap smears, or sexually transmitted infections  SOCIAL HISTORY:  The patient is married to her husband Olivia [name] for 10 years.  Lives with him in a two story house.  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    ADVANCED DIRECTIVES: Not in place.     HEALTH MAINTENANCE: Social History  Substance Use Topics  . Smoking status: Never Smoker   . Smokeless tobacco: Never Used  . Alcohol Use: No    Mammogram: 10/27/2014 Colonoscopy: 06/2009  Bone Density Scan: 10/2011  Pap Smear: 11/2011, scheduled in June, 2015 Eye Exam: 01/2013 Vitamin D Level: previously normal  Lipid Panel: unknown   Allergies  Allergen Reactions  . Adhesive [Tape] Hives  . Morphine Hives    Current Outpatient Prescriptions  Medication Sig Dispense Refill  . ascorbic acid (VITAMIN C) 250 MG tablet Take 500 mg by mouth daily.    [name] BIOTIN PO Take 1 tablet by mouth daily.    . Calcium Carbonate-Vitamin D (CALCIUM + D) 600-200 MG-UNIT TABS Take 2 tablets by mouth daily.      . cetirizine (ZYRTEC) 10 MG tablet Take 10 mg by mouth daily.    . clobetasol ointment (TEMOVATE) 0.05 %   3  . diltiazem (CARDIZEM CD) 180 MG 24 hr capsule TAKE 1 CAPSULE (180 MG TOTAL) BY MOUTH DAILY. 30 capsule 11  . ELIQUIS 5 MG TABS tablet TAKE 1 TABLET BY MOUTH TWICE A DAY 60 tablet 6  . furosemide (LASIX) 40 MG tablet Take 1 tablet (40 mg total) by mouth every other day. 30 tablet 6  . gabapentin (NEURONTIN) 600 MG tablet Take 600 mg by mouth at bedtime.    . lidocaine (LIDODERM) 5 %   5  . lisinopril (PRINIVIL,ZESTRIL) 5 MG tablet TAKE 2 TABLETS (10 MG TOTAL) BY MOUTH DAILY. 60 tablet 10  . Multiple Vitamins-Minerals (MULTIVITAMIN WITH MINERALS) tablet Take 1  tablet by mouth daily.      . nabumetone (RELAFEN) 500 MG tablet Take 500 mg by mouth 2 (two) times daily.      [name] NEXIUM 40 MG capsule TAKE ONE CAPSULE BY MOUTH EVERY DAY 30 capsule 0  . potassium chloride SA (K-DUR,KLOR-CON) 20 MEQ tablet Take 20 mEq by mouth every other day.     . tapentadol (NUCYNTA) 50 MG TABS tablet Take 50 mg by mouth every 6 (six) hours as needed.    [name] UNABLE TO FIND 1 tablet. i Cool supplement  Take 1 tablet daily for hot flashes    .  vitamin E 400 UNIT capsule Take 400 Units by mouth daily.    [name] triamcinolone cream (KENALOG) 0.1 %   1   No current facility-administered medications for this visit.    OBJECTIVE: Filed Vitals:   04/22/15 1517  BP: 118/81  Pulse: 82  Temp: 98 F (36.7 C)  Resp: 18     Body mass index is 30.15 kg/(m^2).     GENERAL: Patient is a well appearing female in no acute distress HEENT:  Sclerae anicteric.  Oropharynx clear and moist. No ulcerations or evidence of oropharyngeal candidiasis. Neck is supple.  NODES:  No cervical, supraclavicular, or axillary lymphadenopathy palpated.  BREAST EXAM:  Right breast without nodules, masses, left breast without nodules or masses, benign bilateral breast exam.  LUNGS:  Clear to auscultation bilaterally.  No wheezes or rhonchi. HEART:  Regular rate and rhythm. No murmur appreciated. ABDOMEN:  Soft, nontender.  Positive, normoactive bowel sounds. No organomegaly palpated. MSK:  No focal spinal tenderness to palpation. Full range of motion bilaterally in the upper extremities. EXTREMITIES:  No peripheral edema.   SKIN:  Clear with no obvious rashes or skin changes. No nail dyscrasia. NEURO:  Nonfocal. Well oriented.  Appropriate affect. Chest wall: Her left anterior rib cage is slightly protruded, but no palpable soft tissue or bone lesion, nontender. The left side lateral chest wall are nontender, no palpable chest wall lesion or skin changes.   ECOG FS:1 - Symptomatic but completely  ambulatory      LAB RESULTS: CBC Latest Ref Rng 03/28/2015 12/16/2014 06/10/2014  WBC 4.0 - 10.5 K/uL 7.0 5.5 7.6  Hemoglobin 12.0 - 15.0 g/dL 13.5 13.6 13.2  Hematocrit 36.0 - 46.0 % 39.0 40.5 39.8  Platelets 150 - 400 K/uL 290 265 284    CMP Latest Ref Rng 03/28/2015 12/16/2014 06/10/2014  Glucose 65 - 99 mg/dL 85 109 88  BUN 7 - 25 mg/dL 9 9.2 9.9  Creatinine 0.50 - 1.10 mg/dL 0.84 0.8 0.9  Sodium 135 - 146 mmol/L 140 141 139  Potassium 3.5 - 5.3 mmol/L 3.6 3.8 3.9  Chloride 98 - 110 mmol/L 104 - -  CO2 20 - 31 mmol/L 27 25 29   Calcium 8.6 - 10.2 mg/dL 9.4 8.9 9.5  Total Protein 6.1 - 8.1 g/dL 6.9 7.5 7.8  Albumin 3.3 - 5.5 g/dL - - -  Total Bilirubin 0.2 - 1.2 mg/dL 0.3 0.34 0.20  Alkaline Phos 33 - 115 U/L 73 83 79  AST 10 - 35 U/L 16 19 18   ALT 6 - 29 U/L 15 16 13      STUDIES: CT chest with contrast 04/01/2015 IMPRESSION: 1. No soft tissue mass or other chest wall abnormality in the lower left hemithorax to account for the perceived palpable abnormality. 2. No acute findings in the thorax. 3. Postoperative changes of prior right breast lumpectomy redemonstrated. 4. Additional incidental findings, as above.  ASSESSMENT: 49 y.o.  [place] woman   1. Right lateral chest wall pain, and protruded anterior rib cage -I think the chest wall nodule she describes is actually her treated anterior rib cage. I did not see bony or soft tissue mass in that area on the recent CT chest. -I do not have a good explanation for her chest wall pain. It is not pruritic, nontender, her oxygen saturation is 100%, says no palpitation or dyspnea, region CT scan with contrast did not show PE (although scan was not done with  PE protocol). She does not have any  other GI symptoms or lesions in the left upper abdomen. -She does have a small renal cyst in the upper left kidney, which was seen on the ultrasound of abdomen in December 2014. No kidney stone, I did not think her chest or pain is  rated to her kidney cyst. -I do not have high suspicion that this represents her breast cancer recurrence. However she is very anxious and tearful, she has been seen by her pain specialist, primary care physician, and me, and does not have a concrete answer. I think it is reasonable to obtain a PET CT scan to ruled out cancer recurrence. -If PET scan is negative, I think this is possible muscular or nerve pain, and I suggest her to follow-up with her pain specialist.  2. History of right sided T2 N0, stage IIA, grade III, invasive ductal carcinoma, ER negative, PR negative, Ki-67 77%, HER-2/neu negative. -She is now 6+ years out of her initial diagnosis. Her physical exam and recent CT chest showed no evidence of recurrence. -We discussed that her risk of cancer recurrence is much less now. -We'll continue surveillance, with annual screening mammogram and a physical exam. -Given her recent chest pain, I'll obtain a PET scan to rule out metastasis  3. 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 -Her recent echo showed EF 50-55%. -She is clinically doing well, we'll continue follow-up with cardiology.  Follow-up: I'll see her back in one month with a PET CT scan   I spent 25 minutes counseling the patient face to face.  The total time spent in the appointment was 30 minutes.  Truitt Merle   04/22/2015 5:26 PM

## 2015-04-22 NOTE — Telephone Encounter (Signed)
Pt confirmed MD visit per 09/23 POF, gave pt AVS and Calendar.[name] KJ

## 2015-04-29 ENCOUNTER — Encounter: Payer: Self-pay | Admitting: Family Medicine

## 2015-04-29 ENCOUNTER — Ambulatory Visit (INDEPENDENT_AMBULATORY_CARE_PROVIDER_SITE_OTHER): Payer: PRIVATE HEALTH INSURANCE | Admitting: Family Medicine

## 2015-04-29 VITALS — BP 118/80 | HR 68 | Temp 97.9°F | Wt 180.6 lb

## 2015-04-29 DIAGNOSIS — J029 Acute pharyngitis, unspecified: Secondary | ICD-10-CM

## 2015-04-29 LAB — POCT RAPID STREP A (OFFICE): Rapid Strep A Screen: NEGATIVE

## 2015-04-29 MED ORDER — AMOXICILLIN 500 MG PO CAPS
500.0000 mg | ORAL_CAPSULE | Freq: Two times a day (BID) | ORAL | Status: DC
Start: 2015-04-29 — End: 2015-05-13

## 2015-04-29 NOTE — Patient Instructions (Addendum)
  Gargle twice daily with warm salt water. Let us know if you're not back to your baseline after you finish antibiotics.   Pharyngitis Pharyngitis is redness, pain, and swelling (inflammation) of your pharynx.  CAUSES  Pharyngitis is usually caused by infection. Most of the time, these infections are from viruses (viral) and are part of a cold. However, sometimes pharyngitis is caused by bacteria (bacterial). Pharyngitis can also be caused by allergies. Viral pharyngitis may be spread from person to person by coughing, sneezing, and personal items or utensils (cups, forks, spoons, toothbrushes). Bacterial pharyngitis may be spread from person to person by more intimate contact, such as kissing.  SIGNS AND SYMPTOMS  Symptoms of pharyngitis include:   Sore throat.   Tiredness (fatigue).   Low-grade fever.   Headache.  Joint pain and muscle aches.  Skin rashes.  Swollen lymph nodes.  Plaque-like film on throat or tonsils (often seen with bacterial pharyngitis). DIAGNOSIS  Your health care provider will ask you questions about your illness and your symptoms. Your medical history, along with a physical exam, is often all that is needed to diagnose pharyngitis. Sometimes, a rapid strep test is done. Other lab tests may also be done, depending on the suspected cause.  TREATMENT  Viral pharyngitis will usually get better in 3-4 days without the use of medicine. Bacterial pharyngitis is treated with medicines that kill germs (antibiotics).  HOME CARE INSTRUCTIONS   Drink enough water and fluids to keep your urine clear or pale yellow.   Only take over-the-counter or prescription medicines as directed by your health care provider:   If you are prescribed antibiotics, make sure you finish them even if you start to feel better.   Do not take aspirin.   Get lots of rest.   Gargle with 8 oz of salt water ( tsp of salt per 1 qt of water) as often as every 1-2 hours to soothe your  throat.   Throat lozenges (if you are not at risk for choking) or sprays may be used to soothe your throat. SEEK MEDICAL CARE IF:   You have large, tender lumps in your neck.  You have a rash.  You cough up green, yellow-brown, or bloody spit. SEEK IMMEDIATE MEDICAL CARE IF:   Your neck becomes stiff.  You drool or are unable to swallow liquids.  You vomit or are unable to keep medicines or liquids down.  You have severe pain that does not go away with the use of recommended medicines.  You have trouble breathing (not caused by a stuffy nose). MAKE SURE YOU:   Understand these instructions.  Will watch your condition.  Will get help right away if you are not doing well or get worse. Document Released: 07/16/2005 Document Revised: 05/06/2013 Document Reviewed: 03/23/2013 [facility] Patient Information 2015 [place], [place]. This information is not intended to replace advice given to you by your health care provider. Make sure you discuss any questions you have with your health care provider.

## 2015-04-29 NOTE — Progress Notes (Signed)
   Subjective:    Patient ID: Olivia [name], female    DOB: 03-21-1966, 49 y.o.   MRN: [id number]  HPI She is here for a 4 day history of sore throat that is worsening. Reports painful swallowing. She also complains of congestion, post nasal drainage, mild dry cough and malaise. She reports a history of allergies. Does not smoke. Unknown sick contacts.  Denies fever, chills, sinus pain, ear pain, nausea, vomiting, diarrhea.    Review of Systems Pertinent positives and negatives in the history of present illness.    Objective:   Physical Exam  Constitutional: She appears well-developed and well-nourished. No distress.  HENT:  Right Ear: Tympanic membrane normal.  Left Ear: Tympanic membrane and external ear normal.  Nose: Mucosal edema present.  Mouth/Throat: Uvula is midline and mucous membranes are normal. Posterior oropharyngeal edema and posterior oropharyngeal erythema present. No oropharyngeal exudate or tonsillar abscesses.  Neck: Trachea normal and normal range of motion. Neck supple. No tracheal tenderness present.  Cardiovascular: Normal rate, regular rhythm and normal heart sounds.   Pulmonary/Chest: Effort normal and breath sounds normal. No stridor. She has no decreased breath sounds. She has no wheezes. She has no rhonchi.  Lymphadenopathy:       Head (right side): No submandibular, no tonsillar and no occipital adenopathy present.       Head (left side): No submandibular, no tonsillar and no occipital adenopathy present.    She has no cervical adenopathy.       Right: No supraclavicular adenopathy present.       Left: No supraclavicular adenopathy present.          Assessment & Plan:  Acute pharyngitis, unspecified pharyngitis type  Rapid strep negative. Will treat with antibiotics due to worsening symptoms. Discussed symptomatic treatment such as warm salt water gargles, staying well hydrated and throat lozenges. She will let me know if she gets worse in the next  2-3 days. Discussed red flag symptoms that would warrant immediate medical care such as difficulty swallowing secretions, high fever, difficulty talking.

## 2015-05-06 ENCOUNTER — Ambulatory Visit (HOSPITAL_COMMUNITY)
Admission: RE | Admit: 2015-05-06 | Discharge: 2015-05-06 | Disposition: A | Discharge status: Home / Self Care | Payer: PRIVATE HEALTH INSURANCE | Source: Ambulatory Visit | Attending: Hematology | Admitting: Hematology

## 2015-05-06 DIAGNOSIS — C50919 Malignant neoplasm of unspecified site of unspecified female breast: Secondary | ICD-10-CM | POA: Diagnosis present

## 2015-05-06 DIAGNOSIS — D259 Leiomyoma of uterus, unspecified: Secondary | ICD-10-CM | POA: Insufficient documentation

## 2015-05-06 DIAGNOSIS — R948 Abnormal results of function studies of other organs and systems: Secondary | ICD-10-CM | POA: Diagnosis not present

## 2015-05-06 LAB — GLUCOSE, CAPILLARY: Glucose-Capillary: 89 mg/dL (ref 65–99)

## 2015-05-06 MED ORDER — FLUDEOXYGLUCOSE F - 18 (FDG) INJECTION
9.0900 | Freq: Once | INTRAVENOUS | Status: DC | PRN
  Administered 2015-05-06: 9.09 via INTRAVENOUS
  Filled 2015-05-06: qty 9.09

## 2015-05-11 ENCOUNTER — Other Ambulatory Visit: Payer: Self-pay | Admitting: Cardiology

## 2015-05-13 ENCOUNTER — Telehealth: Payer: Self-pay | Admitting: Hematology

## 2015-05-13 ENCOUNTER — Encounter: Payer: Self-pay | Admitting: Hematology

## 2015-05-13 ENCOUNTER — Ambulatory Visit (HOSPITAL_BASED_OUTPATIENT_CLINIC_OR_DEPARTMENT_OTHER): Payer: PRIVATE HEALTH INSURANCE | Admitting: Hematology

## 2015-05-13 VITALS — BP 118/77 | HR 84 | Temp 97.4°F | Resp 20 | Ht 65.0 in | Wt 181.6 lb

## 2015-05-13 DIAGNOSIS — Z853 Personal history of malignant neoplasm of breast: Secondary | ICD-10-CM | POA: Diagnosis not present

## 2015-05-13 DIAGNOSIS — R0789 Other chest pain: Secondary | ICD-10-CM

## 2015-05-13 DIAGNOSIS — C50911 Malignant neoplasm of unspecified site of right female breast: Secondary | ICD-10-CM

## 2015-05-13 DIAGNOSIS — Z86718 Personal history of other venous thrombosis and embolism: Secondary | ICD-10-CM

## 2015-05-13 NOTE — Progress Notes (Signed)
[facility]  Clinical follow Up note  [phone] [phone]     ID: Olivia [name] OB: 1965/12/16  MR#: [id number]  XKG#:[id number]  PCP: [name], MD GYN:  Dr. Garwin Brothers SU: Dr. Erroll Luna OTHER MD: Dr. Wyline Mood oncology  CHIEF COMPLAINT:  49 year old [place] woman with right sided T2 N0, stage IIA, grade III, invasive ductal carcinoma, ER negative, PR negative, Ki-67 77%, HER-2/neu negative.  BREAST CANCER HISTORY:  #1 The patient noted a new palpable mass in her right breast and underwent a diagnostic mammogram in January, 2010.  Biopsy was consistent with invasive ductal carcinoma.  A MRI of the breasts revealed a 2.4 cm right breast mass and a satellite lesion.  At 6 o'clock however, a round oval mass was seen on MRI and a biopsy determined it was not malignant.  Patient underwent a right breast lumpectomy on 09/17/2008 and a 2.3 and 1.7 cm grade III invasive ductal carcinoma was removed.  One sentinel node was negative.  The tumor was ER negative, PR negative, Ki-67 of 77%, HER-2/neu negative.    #2 Patient underwent a right breast lumpectomy on 09/17/2008 and a 2.3 and 1.7 cm grade III invasive ductal carcinoma was removed.  One sentinel node was negative.  The tumor was ER negative, PR negative, Ki-67 of 77%, HER-2/neu negative.    #3. Patient then received adjuvant chemotherapy consisting of Adriamycin Cytoxan given dose dense x4 cycles followed by Taxotere (at her best recollection) which she completed in September 2010.   #4 Patient underwent adjuvant radiation therapy that completed in November 2010.  CURRENT THERAPY: observation  INTERVAL HISTORY:   Olivia [name] returns for follow-up and discuss the PET CT scan findings. She is accompanied by her husband to the clinic. She feels about the same, still has chronic back pain, and bilateral side rib pain. No other new complaints.  REVIEW OF SYSTEMS:  A 10 point review  of systems was conducted and is otherwise negative except for what is noted above.     PAST MEDICAL HISTORY: Past Medical History  Diagnosis Date  . Colon polyp   . Obesity   . Uterine fibroid     s/p embolization  . Lumbar degenerative disc disease   . History of breast cancer 2010    invasive ductal R breast; s/p lumpectomy, radiation and chemo  . Obesity   . Breast cancer, stage 1 ([place])   . GERD (gastroesophageal reflux disease)   . 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 Pseudo-normal LV filling (grade 3 diastolic soft. Mild LA dilation.   . H/O diastolic dysfunction nov 2014    1.3 x 1.3 cm mass in RA on Echo -->Cardiac MRI 12/'04: 1.9 x 1.6 cm mass in the lateral RA cavity. -- Most suggestive of thrombus--  calcified and  lamiina and ted suggestive of chronic.; EF from 50-4% with no scar.    PAST SURGICAL HISTORY: Past Surgical History  Procedure Laterality Date  . Breast lumpectomy  2010    right  . Tubal ligation  2010  . Colonoscopy  2010  . Power port    . Lumbar laminectomy/decompression microdiscectomy  2007, 2008    Dr. Hal Neer x 2  . Uterine fibroid embolization  2008 or 2009  . Transthoracic echocardiogram   November 2014    Mild LVH, EF of roughly 50%. Pseudo-normal LV filling (grade 3 diastolic soft. Mild LA dilation.  1.3 x 1.3 cm mass in right atrium.  . Cardiac mri  December 2014    1.9 x 1.6 cm mass in the lateral RA cavity. -- Most suggestive of thrombus--  calcified anndd  laammiinnaated suggestive of chronic.; EF from 50-4% with no scar. Trivial effusion    FAMILY HISTORY Family History  Problem Relation Age of Onset  . Breast cancer Mother   . Cancer Maternal Grandmother     renal cancer    GYNECOLOGIC HISTORY:  Menarche at age 75, G60 P1, No history of estrogen replacement therapy, no history of abnormal pap smears, or sexually transmitted infections  SOCIAL HISTORY:  The patient is married to her husband  Olivia [name] for 10 years.  Lives with him in a two story house.  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    ADVANCED DIRECTIVES: Not in place.     HEALTH MAINTENANCE: Social History  Substance Use Topics  . Smoking status: Never Smoker   . Smokeless tobacco: Never Used  . Alcohol Use: No    Mammogram: 10/27/2014 Colonoscopy: 06/2009  Bone Density Scan: 10/2011  Pap Smear: 11/2011, scheduled in June, 2015 Eye Exam: 01/2013 Vitamin D Level: previously normal  Lipid Panel: unknown   Allergies  Allergen Reactions  . Adhesive [Tape] Hives  . Morphine Hives    Current Outpatient Prescriptions  Medication Sig Dispense Refill  . amoxicillin (AMOXIL) 500 MG capsule Take 1 capsule (500 mg total) by mouth 2 (two) times daily. 20 capsule 0  . ascorbic acid (VITAMIN C) 250 MG tablet Take 500 mg by mouth daily.    [name] BIOTIN PO Take 1 tablet by mouth daily.    . Calcium Carbonate-Vitamin D (CALCIUM + D) 600-200 MG-UNIT TABS Take 2 tablets by mouth daily.      . cetirizine (ZYRTEC) 10 MG tablet Take 10 mg by mouth daily.    . clobetasol ointment (TEMOVATE) 0.05 %   3  . diltiazem (CARDIZEM CD) 180 MG 24 hr capsule TAKE 1 CAPSULE (180 MG TOTAL) BY MOUTH DAILY. 30 capsule 11  . ELIQUIS 5 MG TABS tablet TAKE 1 TABLET BY MOUTH TWICE A DAY 60 tablet 6  . furosemide (LASIX) 40 MG tablet Take 1 tablet (40 mg total) by mouth every other day. 30 tablet 6  . gabapentin (NEURONTIN) 600 MG tablet Take 600 mg by mouth at bedtime.    . lidocaine (LIDODERM) 5 %   5  . lisinopril (PRINIVIL,ZESTRIL) 5 MG tablet TAKE 2 TABLETS (10 MG TOTAL) BY MOUTH DAILY. 60 tablet 10  . Multiple Vitamins-Minerals (MULTIVITAMIN WITH MINERALS) tablet Take 1 tablet by mouth daily.      . nabumetone (RELAFEN) 500 MG tablet Take 500 mg by mouth 2 (two) times daily.      [name] NEXIUM 40 MG capsule TAKE ONE CAPSULE BY MOUTH EVERY DAY 30 capsule 0  . potassium chloride SA  (K-DUR,KLOR-CON) 20 MEQ tablet Take 20 mEq by mouth every other day.     . tapentadol (NUCYNTA) 50 MG TABS tablet Take 50 mg by mouth every 6 (six) hours as needed.    . triamcinolone cream (KENALOG) 0.1 %   1  . UNABLE TO FIND 1 tablet. i Cool supplement  Take 1 tablet daily for hot flashes    . vitamin E 400 UNIT capsule Take 400 Units by mouth daily.     No current facility-administered medications for this visit.  OBJECTIVE: Filed Vitals:   05/13/15 1438  BP: 118/77  Pulse: 84  Temp: 97.4 F (36.3 C)  Resp: 20     Body mass index is 30.22 kg/(m^2).     GENERAL: Patient is a well appearing female in no acute distress HEENT:  Sclerae anicteric.  Oropharynx clear and moist. No ulcerations or evidence of oropharyngeal candidiasis. Neck is supple.  NODES:  No cervical, supraclavicular, or axillary lymphadenopathy palpated.  BREAST EXAM:  Right breast without nodules, masses, left breast without nodules or masses, benign bilateral breast exam.  LUNGS:  Clear to auscultation bilaterally.  No wheezes or rhonchi. HEART:  Regular rate and rhythm. No murmur appreciated. ABDOMEN:  Soft, nontender.  Positive, normoactive bowel sounds. No organomegaly palpated. MSK:  No focal spinal tenderness to palpation. Full range of motion bilaterally in the upper extremities. EXTREMITIES:  No peripheral edema.   SKIN:  Clear with no obvious rashes or skin changes. No nail dyscrasia. NEURO:  Nonfocal. Well oriented.  Appropriate affect. Chest wall: Her left anterior rib cage is slightly protruded, but no palpable soft tissue or bone lesion, nontender. The left side lateral chest wall are nontender, no palpable chest wall lesion or skin changes.   ECOG FS:1 - Symptomatic but completely ambulatory      LAB RESULTS: CBC Latest Ref Rng 03/28/2015 12/16/2014 06/10/2014  WBC 4.0 - 10.5 K/uL 7.0 5.5 7.6  Hemoglobin 12.0 - 15.0 g/dL 13.5 13.6 13.2  Hematocrit 36.0 - 46.0 % 39.0 40.5 39.8  Platelets 150  - 400 K/uL 290 265 284    CMP Latest Ref Rng 03/28/2015 12/16/2014 06/10/2014  Glucose 65 - 99 mg/dL 85 109 88  BUN 7 - 25 mg/dL 9 9.2 9.9  Creatinine 0.50 - 1.10 mg/dL 0.84 0.8 0.9  Sodium 135 - 146 mmol/L 140 141 139  Potassium 3.5 - 5.3 mmol/L 3.6 3.8 3.9  Chloride 98 - 110 mmol/L 104 - -  CO2 20 - 31 mmol/L 27 25 29   Calcium 8.6 - 10.2 mg/dL 9.4 8.9 9.5  Total Protein 6.1 - 8.1 g/dL 6.9 7.5 7.8  Total Bilirubin 0.2 - 1.2 mg/dL 0.3 0.34 0.20  Alkaline Phos 33 - 115 U/L 73 83 79  AST 10 - 35 U/L 16 19 18   ALT 6 - 29 U/L 15 16 13      RADIOLOGY STUDIES: PET 05/06/2015 IMPRESSION: 1. Two areas of mild increased uptake within the right breast likely reflects the post biopsy changes. Correlation with routine breast cancer screening recommended. 2. No specific findings identified to suggest hypermetabolic metastasis. 3. Asymmetric increased uptake localizing to the superior articulating facet of the L2 vertebra is likely related to degenerative change. If there is a clinical concern for bone metastasis then this could be better assessed with lumbar spine MRI.  ASSESSMENT: 49 y.o.  [place] woman   1. Right lateral chest wall pain, and protruded anterior rib cage -I think the chest wall nodule she describes is actually her protruded anterior rib cage. I did not see bony or soft tissue mass in that area on the recent CT chest. -I reviewed her PET CT scan findings. There is no hypermetabolic lesions on bilateral chest wall or ribs. -I think her chest wall pain could be related to her previous spinal surgery, or muscular pain -I reassured her that her pain is not related to her previous cancer.  2. History of right sided T2 N0, stage IIA, grade III, invasive ductal carcinoma, ER negative, PR negative, Ki-67  77%, HER-2/neu negative. -She is now 6+ years out of her initial diagnosis. Her physical exam and recent CT chest showed no evidence of recurrence. -I reviewed her recent  PET scan findings, there are no evidence of cancer recurrence. There is mild hypermetabolic uptake in her right breast, likely related to her previous biopsy and surgery. I'll obtain a diagnostic right breast mammogram to be safe. -We'll continue surveillance, with annual screening mammogram and a physical exam.  3. 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 -Her recent echo showed EF 50-55%. -She is clinically doing well, we'll continue follow-up with cardiology.  Follow-up:  right breast diagnostic mammogram in the next months. I'll see her back in 6 months.  I spent 25 minutes counseling the patient face to face.  The total time spent in the appointment was 30 minutes.  Truitt Merle   05/13/2015 2:59 PM

## 2015-05-13 NOTE — Telephone Encounter (Signed)
Gave adn printed appt sched and avs for pt for NOV and April 2017

## 2015-06-03 ENCOUNTER — Ambulatory Visit
Admission: RE | Admit: 2015-06-03 | Discharge: 2015-06-03 | Disposition: A | Discharge status: Home / Self Care | Payer: PRIVATE HEALTH INSURANCE | Source: Ambulatory Visit | Attending: Hematology | Admitting: Hematology

## 2015-06-03 DIAGNOSIS — C50911 Malignant neoplasm of unspecified site of right female breast: Secondary | ICD-10-CM

## 2015-10-13 NOTE — Telephone Encounter (Signed)
none

## 2015-10-18 ENCOUNTER — Other Ambulatory Visit: Payer: Self-pay | Admitting: Cardiology

## 2015-10-18 NOTE — Telephone Encounter (Signed)
Rx(s) sent to pharmacy electronically.  

## 2015-10-24 ENCOUNTER — Ambulatory Visit (INDEPENDENT_AMBULATORY_CARE_PROVIDER_SITE_OTHER): Payer: PRIVATE HEALTH INSURANCE | Admitting: Family Medicine

## 2015-10-24 ENCOUNTER — Encounter: Payer: Self-pay | Admitting: Family Medicine

## 2015-10-24 ENCOUNTER — Other Ambulatory Visit: Payer: Self-pay | Admitting: Hematology

## 2015-10-24 VITALS — BP 124/80 | HR 64 | Temp 97.8°F | Wt 185.2 lb

## 2015-10-24 DIAGNOSIS — R3915 Urgency of urination: Secondary | ICD-10-CM

## 2015-10-24 DIAGNOSIS — R8299 Other abnormal findings in urine: Secondary | ICD-10-CM | POA: Diagnosis not present

## 2015-10-24 DIAGNOSIS — R82998 Other abnormal findings in urine: Secondary | ICD-10-CM

## 2015-10-24 DIAGNOSIS — Z853 Personal history of malignant neoplasm of breast: Secondary | ICD-10-CM

## 2015-10-24 DIAGNOSIS — Z9889 Other specified postprocedural states: Secondary | ICD-10-CM

## 2015-10-24 LAB — POCT URINALYSIS DIPSTICK
Bilirubin, UA: NEGATIVE
Blood, UA: NEGATIVE
GLUCOSE UA: NEGATIVE
KETONES UA: NEGATIVE
Leukocytes, UA: NEGATIVE
Nitrite, UA: NEGATIVE
Urobilinogen, UA: NEGATIVE
pH, UA: 6

## 2015-10-24 MED ORDER — NITROFURANTOIN MONOHYD MACRO 100 MG PO CAPS: 100.0000 mg | ORAL_CAPSULE | Freq: Two times a day (BID) | ORAL | Status: DC

## 2015-10-24 NOTE — Progress Notes (Signed)
Subjective:  Olivia [name] is a 50 y.o. female who complains of possible urinary tract infection.  She has had symptoms for 1 week, states she has had waxing and waning symptoms prior to that and thought they had completely resolved but returned.  Symptoms include urinary urgency, frequency, dark urine, back pain. Patient denies fever, stomach ache and vaginal discharge.  Last UTI was about 1 year ago.   Using cranberry juice for current symptoms.    Patient does not have a history of recurrent UTI. Patient does not have a history of pyelonephritis.  No other aggravating or relieving factors.    Past Medical History  Diagnosis Date  . Colon polyp   . Obesity   . Uterine fibroid     s/p embolization  . Lumbar degenerative disc disease   . History of breast cancer 2010    invasive ductal R breast; s/p lumpectomy, radiation and chemo  . Obesity   . Breast cancer, stage 1 ([facility])   . GERD (gastroesophageal reflux disease)   . Right atrial thrombus [facility]) Nov 2014    Echo: Mild LVH, EF of roughly 50%. Pseudo-normal LV filling (grade 3 diastolic soft. Mild LA dilation.   . H/O diastolic dysfunction nov 2014    1.3 x 1.3 cm mass in RA on Echo -->Cardiac MRI 12/'04: 1.9 x 1.6 cm mass in the lateral RA cavity. -- Most suggestive of thrombus--  calcified and  lamiina and ted suggestive of chronic.; EF from 50-4% with no scar.    ROS as in subjective  Reviewed allergies, medications, past medical, surgical, and social history.    Objective: Filed Vitals:   10/24/15 1507  BP: 124/80  Pulse: 64  Temp: 97.8 F (36.6 C)    General appearance: alert, no distress, WD/WN, female Abdomen: +bs, soft, non tender, non distended, no masses, no hepatomegaly, no splenomegaly, no bruits Back: no CVA tenderness GU: deferred     Laboratory:  Urine dipstick: trace for protein.  Otherwise negative. Urine culture sent.      Assessment: Urinary urgency - Plan: Urine culture  Dark urine - Plan: POCT  urinalysis dipstick, CANCELED: POCT urinalysis dipstick    Plan:  Patient requests antibiotic based on symptoms even though UA dipstick does not indicate UTI.  I think this is appropriate.  Discussed symptoms, diagnosis, possible complications, and usual course of illness.  Begin medication Macrobid per orders.  Advised increased water intake, can use OTC Tylenol for pain.    Advised that if urine culture results indicate no need for antibiotic or need for different medication, will call.      Call or return if worse or not improving.

## 2015-10-25 LAB — URINE CULTURE
COLONY COUNT: NO GROWTH
ORGANISM ID, BACTERIA: NO GROWTH

## 2015-10-31 ENCOUNTER — Ambulatory Visit
Admission: RE | Admit: 2015-10-31 | Discharge: 2015-10-31 | Disposition: A | Discharge status: Home / Self Care | Payer: PRIVATE HEALTH INSURANCE | Source: Ambulatory Visit | Attending: Hematology | Admitting: Hematology

## 2015-10-31 DIAGNOSIS — R928 Other abnormal and inconclusive findings on diagnostic imaging of breast: Secondary | ICD-10-CM | POA: Diagnosis not present

## 2015-10-31 DIAGNOSIS — Z853 Personal history of malignant neoplasm of breast: Secondary | ICD-10-CM

## 2015-10-31 DIAGNOSIS — Z9889 Other specified postprocedural states: Secondary | ICD-10-CM

## 2015-11-09 ENCOUNTER — Telehealth: Payer: Self-pay | Admitting: Hematology

## 2015-11-09 NOTE — Telephone Encounter (Signed)
pt cld and left voicemail in re to appt-cld & spoke to pt and gave pt time & date of appt

## 2015-11-10 ENCOUNTER — Telehealth: Payer: Self-pay | Admitting: Hematology

## 2015-11-10 ENCOUNTER — Other Ambulatory Visit: Payer: PRIVATE HEALTH INSURANCE

## 2015-11-10 ENCOUNTER — Encounter: Payer: Self-pay | Admitting: Hematology

## 2015-11-10 ENCOUNTER — Encounter: Payer: PRIVATE HEALTH INSURANCE | Admitting: Hematology

## 2015-11-10 DIAGNOSIS — I509 Heart failure, unspecified: Secondary | ICD-10-CM | POA: Insufficient documentation

## 2015-11-10 NOTE — Telephone Encounter (Signed)
per pof to sch pt appt-cld & spoke topt and gave pt time & date of r/s appt5/11@1 :45

## 2015-11-10 NOTE — Progress Notes (Signed)
This encounter was created in error - please disregard.

## 2015-11-11 ENCOUNTER — Telehealth: Payer: Self-pay | Admitting: Hematology

## 2015-11-11 NOTE — Telephone Encounter (Signed)
pt cld and left a message in re to appt-cld pt back and adv to call me back. No Detail left

## 2015-11-14 ENCOUNTER — Other Ambulatory Visit: Payer: Self-pay | Admitting: Cardiology

## 2015-11-14 NOTE — Telephone Encounter (Signed)
Rx(s) sent to pharmacy electronically.  

## 2015-12-06 ENCOUNTER — Telehealth: Payer: Self-pay | Admitting: Hematology

## 2015-12-06 NOTE — Telephone Encounter (Signed)
pt called to resched 5/11 apt to 5/19

## 2015-12-08 ENCOUNTER — Other Ambulatory Visit: Payer: PRIVATE HEALTH INSURANCE

## 2015-12-08 ENCOUNTER — Ambulatory Visit: Payer: PRIVATE HEALTH INSURANCE | Admitting: Hematology

## 2015-12-12 ENCOUNTER — Other Ambulatory Visit: Payer: Self-pay | Admitting: Cardiology

## 2015-12-12 NOTE — Telephone Encounter (Signed)
Rx request sent to pharmacy.  

## 2015-12-13 ENCOUNTER — Other Ambulatory Visit: Payer: Self-pay | Admitting: Cardiology

## 2015-12-13 NOTE — Telephone Encounter (Signed)
REFILL 

## 2015-12-16 ENCOUNTER — Other Ambulatory Visit (HOSPITAL_BASED_OUTPATIENT_CLINIC_OR_DEPARTMENT_OTHER): Payer: PRIVATE HEALTH INSURANCE

## 2015-12-16 ENCOUNTER — Telehealth: Payer: Self-pay | Admitting: Hematology

## 2015-12-16 ENCOUNTER — Ambulatory Visit: Payer: PRIVATE HEALTH INSURANCE | Admitting: Hematology

## 2015-12-16 ENCOUNTER — Encounter: Payer: Self-pay | Admitting: Hematology

## 2015-12-16 ENCOUNTER — Ambulatory Visit (HOSPITAL_BASED_OUTPATIENT_CLINIC_OR_DEPARTMENT_OTHER): Payer: PRIVATE HEALTH INSURANCE | Admitting: Hematology

## 2015-12-16 ENCOUNTER — Other Ambulatory Visit: Payer: Medicare Other

## 2015-12-16 VITALS — BP 120/92 | HR 82 | Temp 98.1°F | Resp 18 | Ht 65.0 in | Wt 184.5 lb

## 2015-12-16 DIAGNOSIS — Z86718 Personal history of other venous thrombosis and embolism: Secondary | ICD-10-CM

## 2015-12-16 DIAGNOSIS — Z853 Personal history of malignant neoplasm of breast: Secondary | ICD-10-CM | POA: Diagnosis not present

## 2015-12-16 DIAGNOSIS — C50919 Malignant neoplasm of unspecified site of unspecified female breast: Secondary | ICD-10-CM

## 2015-12-16 DIAGNOSIS — C50911 Malignant neoplasm of unspecified site of right female breast: Secondary | ICD-10-CM

## 2015-12-16 LAB — CBC WITH DIFFERENTIAL/PLATELET
BASO%: 0.9 % (ref 0.0–2.0)
Basophils Absolute: 0.1 10*3/uL (ref 0.0–0.1)
EOS ABS: 0.1 10*3/uL (ref 0.0–0.5)
EOS%: 1.2 % (ref 0.0–7.0)
HEMATOCRIT: 40.5 % (ref 34.8–46.6)
HEMOGLOBIN: 13.7 g/dL (ref 11.6–15.9)
LYMPH#: 2.3 10*3/uL (ref 0.9–3.3)
LYMPH%: 35.9 % (ref 14.0–49.7)
MCH: 31 pg (ref 25.1–34.0)
MCHC: 33.9 g/dL (ref 31.5–36.0)
MCV: 91.7 fL (ref 79.5–101.0)
MONO#: 0.4 10*3/uL (ref 0.1–0.9)
MONO%: 6.1 % (ref 0.0–14.0)
NEUT%: 55.9 % (ref 38.4–76.8)
NEUTROS ABS: 3.6 10*3/uL (ref 1.5–6.5)
PLATELETS: 234 10*3/uL (ref 145–400)
RBC: 4.42 10*6/uL (ref 3.70–5.45)
RDW: 12.9 % (ref 11.2–14.5)
WBC: 6.4 10*3/uL (ref 3.9–10.3)

## 2015-12-16 LAB — COMPREHENSIVE METABOLIC PANEL
ALBUMIN: 3.8 g/dL (ref 3.5–5.0)
ALK PHOS: 68 U/L (ref 40–150)
ALT: 16 U/L (ref 0–55)
ANION GAP: 7 meq/L (ref 3–11)
AST: 16 U/L (ref 5–34)
BILIRUBIN TOTAL: 0.41 mg/dL (ref 0.20–1.20)
BUN: 9.8 mg/dL (ref 7.0–26.0)
CALCIUM: 9.4 mg/dL (ref 8.4–10.4)
CO2: 29 meq/L (ref 22–29)
CREATININE: 0.9 mg/dL (ref 0.6–1.1)
Chloride: 106 mEq/L (ref 98–109)
EGFR: 89 mL/min/{1.73_m2} — AB (ref >90)
Glucose: 77 mg/dl (ref 70–140)
Potassium: 3.5 mEq/L (ref 3.5–5.1)
Sodium: 141 mEq/L (ref 136–145)
TOTAL PROTEIN: 7.6 g/dL (ref 6.4–8.3)

## 2015-12-16 NOTE — Progress Notes (Signed)
[place]  Clinical follow Up note  [phone] [phone]     ID: [name] OB: 1965-11-06  MR#: [id number]  XQJ#:[id number]  PCP: [name], MD GYN:  Dr. Garwin Brothers SU: Dr. Erroll Luna OTHER MD: Dr. Wyline Mood oncology  CHIEF COMPLAINT:  50 year old [employer] woman with right sided T2 N0, stage IIA, grade III, invasive ductal carcinoma, ER negative, PR negative, Ki-67 77%, HER-2/neu negative.  BREAST CANCER HISTORY:  #1 The patient noted a new palpable mass in her right breast and underwent a diagnostic mammogram in January, 2010.  Biopsy was consistent with invasive ductal carcinoma.  A MRI of the breasts revealed a 2.4 cm right breast mass and a satellite lesion.  At 6 o'clock however, a round oval mass was seen on MRI and a biopsy determined it was not malignant.  Patient underwent a right breast lumpectomy on 09/17/2008 and a 2.3 and 1.7 cm grade III invasive ductal carcinoma was removed.  One sentinel node was negative.  The tumor was ER negative, PR negative, Ki-67 of 77%, HER-2/neu negative.    #2 Patient underwent a right breast lumpectomy on 09/17/2008 and a 2.3 and 1.7 cm grade III invasive ductal carcinoma was removed.  One sentinel node was negative.  The tumor was ER negative, PR negative, Ki-67 of 77%, HER-2/neu negative.    #3. Patient then received adjuvant chemotherapy consisting of Adriamycin Cytoxan given dose dense x4 cycles followed by Taxotere (at her best recollection) which she completed in September 2010.   #4 Patient underwent adjuvant radiation therapy that completed in November 2010.  CURRENT THERAPY: observation  INTERVAL HISTORY:   Mrs Remmers returns for follow-up. She is accompanied by her husband to the clinic today. She is doing well overall, her right-sided chest pain has resolved,although she still feels her right rib cage is protruded. No other new complaints, moderate fatigue and dyspnea on  exertion is stable. No pain, abdominal discomfort, cough, or other symptoms. Her appetite is good and her weight is stable.  REVIEW OF SYSTEMS:  A 10 point review of systems was conducted and is otherwise negative except for what is noted above.     PAST MEDICAL HISTORY: Past Medical History  Diagnosis Date  . Colon polyp   . Obesity   . Uterine fibroid     s/p embolization  . Lumbar degenerative disc disease   . History of breast cancer 2010    invasive ductal R breast; s/p lumpectomy, radiation and chemo  . Obesity   . Breast cancer, stage 1 ([place])   . GERD (gastroesophageal reflux disease)   . Right atrial thrombus [facility]) Nov 2014    Echo: Mild LVH, EF of roughly 50%. Pseudo-normal LV filling (grade 3 diastolic soft. Mild LA dilation.   . H/O diastolic dysfunction nov 2014    1.3 x 1.3 cm mass in RA on Echo -->Cardiac MRI 12/'04: 1.9 x 1.6 cm mass in the lateral RA cavity. -- Most suggestive of thrombus--  calcified and  lamiina and ted suggestive of chronic.; EF from 50-4% with no scar.    PAST SURGICAL HISTORY: Past Surgical History  Procedure Laterality Date  . Breast lumpectomy  2010    right  . Tubal ligation  2010  . Colonoscopy  2010  . Power port    . Lumbar laminectomy/decompression microdiscectomy  2007, 2008    Dr. Hal Neer x 2  . Uterine fibroid embolization  2008 or 2009  . Transthoracic  echocardiogram   November 2014    Mild LVH, EF of roughly 50%. Pseudo-normal LV filling (grade 3 diastolic soft. Mild LA dilation. 1.3 x 1.3 cm mass in right atrium.  . Cardiac mri  December 2014    1.9 x 1.6 cm mass in the lateral RA cavity. -- Most suggestive of thrombus--  calcified anndd  laammiinnaated suggestive of chronic.; EF from 50-4% with no scar. Trivial effusion    FAMILY HISTORY Family History  Problem Relation Age of Onset  . Breast cancer Mother   . Cancer Maternal Grandmother     renal cancer    GYNECOLOGIC HISTORY:  Menarche at age 77, G63 P1, No  history of estrogen replacement therapy, no history of abnormal pap smears, or sexually transmitted infections  SOCIAL HISTORY:  The patient is married to her husband Janese Radabaugh for 10 years.  Lives with him in a two story house.  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    ADVANCED DIRECTIVES: Not in place.     HEALTH MAINTENANCE: Social History  Substance Use Topics  . Smoking status: Never Smoker   . Smokeless tobacco: Never Used  . Alcohol Use: No    Mammogram: 10/31/2015 Colonoscopy: 06/2009  Bone Density Scan: 01/05/2014 Pap Smear: June, 2015 Eye Exam: 01/2013 Vitamin D Level: previously normal  Lipid Panel: unknown   Allergies  Allergen Reactions  . Adhesive [Tape] Hives  . Morphine Hives    Current Outpatient Prescriptions  Medication Sig Dispense Refill  . ascorbic acid (VITAMIN C) 250 MG tablet Take 500 mg by mouth daily.    [name] BIOTIN PO Take 1 tablet by mouth daily.    . Calcium Carbonate-Vitamin D (CALCIUM + D) 600-200 MG-UNIT TABS Take 2 tablets by mouth daily.      . cetirizine (ZYRTEC) 10 MG tablet Take 10 mg by mouth daily.    . clobetasol ointment (TEMOVATE) 0.05 %   3  . diltiazem (CARDIZEM CD) 180 MG 24 hr capsule Take 1 capsule (180 mg total) by mouth daily. Please schedule appointment for refills. 30 capsule 0  . ELIQUIS 5 MG TABS tablet TAKE 1 TABLET BY MOUTH TWICE A DAY 60 tablet 6  . furosemide (LASIX) 40 MG tablet Take 1 tablet (40 mg) by mouth every other day. Please schedule appointment for refills. 30 tablet 0  . gabapentin (NEURONTIN) 600 MG tablet Take 600 mg by mouth at bedtime.    . lidocaine (LIDODERM) 5 %   5  . lisinopril (PRINIVIL,ZESTRIL) 5 MG tablet Take 2 tablets (10 mg total) by mouth daily. NEED OV. 60 tablet 0  . Multiple Vitamins-Minerals (MULTIVITAMIN WITH MINERALS) tablet Take 1 tablet by mouth daily.      . nabumetone (RELAFEN) 500 MG tablet Take 500 mg by mouth 2 (two)  times daily.      [name] NEXIUM 40 MG capsule TAKE ONE CAPSULE BY MOUTH EVERY DAY 30 capsule 0  . nitrofurantoin, macrocrystal-monohydrate, (MACROBID) 100 MG capsule Take 1 capsule (100 mg total) by mouth 2 (two) times daily. 10 capsule 0  . potassium chloride SA (K-DUR,KLOR-CON) 20 MEQ tablet Take 20 mEq by mouth every other day.     . tapentadol (NUCYNTA) 50 MG TABS tablet Take 50 mg by mouth every 6 (six) hours as needed.    . triamcinolone cream (KENALOG) 0.1 %   1  . UNABLE TO FIND 1 tablet. i Cool supplement  Take 1  tablet daily for hot flashes    . vitamin E 400 UNIT capsule Take 400 Units by mouth daily.     No current facility-administered medications for this visit.    OBJECTIVE: Filed Vitals:   12/16/15 1344  BP: 120/92  Pulse: 82  Temp: 98.1 F (36.7 C)  Resp: 18     Body mass index is 30.7 kg/(m^2).     GENERAL: Patient is a well appearing female in no acute distress HEENT:  Sclerae anicteric.  Oropharynx clear and moist. No ulcerations or evidence of oropharyngeal candidiasis. Neck is supple.  NODES:  No cervical, supraclavicular, or axillary lymphadenopathy palpated.  BREAST EXAM:  Right breast without nodules, masses, left breast without nodules or masses, benign bilateral breast exam.  LUNGS:  Clear to auscultation bilaterally.  No wheezes or rhonchi. HEART:  Regular rate and rhythm. No murmur appreciated. ABDOMEN:  Soft, nontender.  Positive, normoactive bowel sounds. No organomegaly palpated. MSK:  No focal spinal tenderness to palpation. Full range of motion bilaterally in the upper extremities. EXTREMITIES:  No peripheral edema.   SKIN:  Clear with no obvious rashes or skin changes. No nail dyscrasia. NEURO:  Nonfocal. Well oriented.  Appropriate affect. Chest wall: Her left anterior rib cage is slightly protruded, but no palpable soft tissue or bone lesion, nontender. The left side lateral chest wall are nontender, no palpable chest wall lesion or skin  changes.   ECOG FS:1 - Symptomatic but completely ambulatory    LAB RESULTS: CBC Latest Ref Rng 12/16/2015 03/28/2015 12/16/2014  WBC 3.9 - 10.3 10e3/uL 6.4 7.0 5.5  Hemoglobin 11.6 - 15.9 g/dL 13.7 13.5 13.6  Hematocrit 34.8 - 46.6 % 40.5 39.0 40.5  Platelets 145 - 400 10e3/uL 234 290 265    CMP Latest Ref Rng 12/16/2015 03/28/2015 12/16/2014  Glucose 70 - 140 mg/dl 77 85 109  BUN 7.0 - 26.0 mg/dL 9.8 9 9.2  Creatinine 0.6 - 1.1 mg/dL 0.9 0.84 0.8  Sodium 136 - 145 mEq/L 141 140 141  Potassium 3.5 - 5.1 mEq/L 3.5 3.6 3.8  Chloride 98 - 110 mmol/L - 104 -  CO2 22 - 29 mEq/L 29 27 25   Calcium 8.4 - 10.4 mg/dL 9.4 9.4 8.9  Total Protein 6.4 - 8.3 g/dL 7.6 6.9 7.5  Total Bilirubin 0.20 - 1.20 mg/dL 0.41 0.3 0.34  Alkaline Phos 40 - 150 U/L 68 73 83  AST 5 - 34 U/L 16 16 19   ALT 0 - 55 U/L 16 15 16      RADIOLOGY STUDIES: PET 05/06/2015 IMPRESSION: 1. Two areas of mild increased uptake within the right breast likely reflects the post biopsy changes. Correlation with routine breast cancer screening recommended. 2. No specific findings identified to suggest hypermetabolic metastasis. 3. Asymmetric increased uptake localizing to the superior articulating facet of the L2 vertebra is likely related to degenerative change. If there is a clinical concern for bone metastasis then this could be better assessed with lumbar spine MRI.  Bilateral diagnostic mammogram 10/31/2015  IMPRESSION: 1. No evidence of recurrent or new breast malignancy. 2. Benign postsurgical changes on the right. New  RECOMMENDATION: Screening mammogram in one year.(Code:[id number])   ASSESSMENT: 50 y.o.  [place], [place] woman   1. History of right sided T2 N0, stage IIA, grade III, invasive ductal carcinoma, ER negative, PR negative, Ki-67 77%, HER-2/neu negative. -She is now 7 years out of her initial diagnosis. Her physical exam and recent mammogram showed no evidence of recurrence. -she had a PET  scan for  her right-sided chest pain in October 2016, which was negative for disease recurrence. -We'll continue surveillance, with annual screening mammogram and a physical exam. She prefers to be seen every 6 months, instead of annually. -I encouraged her to have healthy diet and exercise. I introduced the "LIVING WELL" diet and exercise program for breast cancer survivors to her, she is interested, will call for more information and registration.   3. 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 -Her recent echo showed EF 50-55%. -She is clinically doing well, we'll continue follow-up with cardiology.  Follow-up:  I'll see her back in 6 months with lab and exam.   I spent 15 minutes counseling the patient face to face.  The total time spent in the appointment was 20 minutes.  Truitt Merle   12/16/2015 12/16/2015 12:31 PM

## 2015-12-16 NOTE — Telephone Encounter (Signed)
Gave pt apt & avs °

## 2015-12-21 ENCOUNTER — Telehealth: Payer: Self-pay | Admitting: Internal Medicine

## 2015-12-21 NOTE — Telephone Encounter (Signed)
Transderm scopolamine. Changed every 3 days--need to know how long she is going to know how many to give.  Usually I rx box of 4 and they have extra, but if not covered by her insurance (we have been getting denials) she may just want the exact # she needs.

## 2015-12-21 NOTE — Telephone Encounter (Signed)
Left message for patient to return my call.

## 2015-12-21 NOTE — Telephone Encounter (Signed)
Pt states she is leaving on Saturday to go out of town and needs patches for incase she gets sea sick. Send to [facility]

## 2015-12-22 MED ORDER — SCOPOLAMINE 1 MG/3DAYS TD PT72: 1.0000 | MEDICATED_PATCH | TRANSDERMAL | Status: DC

## 2015-12-22 NOTE — Telephone Encounter (Signed)
Patient going for 7 days, said she would like the box of 4 and if ins doesn't pay she will use her benny card.

## 2016-01-14 ENCOUNTER — Other Ambulatory Visit: Payer: Self-pay | Admitting: Cardiology

## 2016-01-16 NOTE — Telephone Encounter (Signed)
Rx(s) sent to pharmacy electronically.  

## 2016-01-31 ENCOUNTER — Other Ambulatory Visit: Payer: Self-pay | Admitting: Cardiology

## 2016-02-03 ENCOUNTER — Other Ambulatory Visit: Payer: Self-pay | Admitting: *Deleted

## 2016-02-03 NOTE — Telephone Encounter (Signed)
Patient asked that this be refilled up until her appointment.

## 2016-02-06 ENCOUNTER — Other Ambulatory Visit: Payer: Self-pay | Admitting: *Deleted

## 2016-02-06 MED ORDER — LISINOPRIL 5 MG PO TABS: 5.0000 mg | ORAL_TABLET | Freq: Two times a day (BID) | ORAL | Status: DC

## 2016-02-06 MED ORDER — DILTIAZEM HCL ER COATED BEADS 180 MG PO CP24: ORAL_CAPSULE | ORAL | Status: DC

## 2016-02-14 ENCOUNTER — Other Ambulatory Visit: Payer: Self-pay | Admitting: Cardiology

## 2016-02-15 ENCOUNTER — Other Ambulatory Visit: Payer: Self-pay | Admitting: Cardiology

## 2016-02-15 NOTE — Telephone Encounter (Signed)
Diltiazem refilled 02/06/16

## 2016-04-03 ENCOUNTER — Other Ambulatory Visit: Payer: Self-pay | Admitting: Cardiology

## 2016-04-10 ENCOUNTER — Ambulatory Visit (INDEPENDENT_AMBULATORY_CARE_PROVIDER_SITE_OTHER): Payer: PRIVATE HEALTH INSURANCE | Admitting: Cardiology

## 2016-04-10 ENCOUNTER — Encounter: Payer: Self-pay | Admitting: Cardiology

## 2016-04-10 VITALS — BP 108/70 | HR 72 | Ht 66.0 in | Wt 193.0 lb

## 2016-04-10 DIAGNOSIS — I213 ST elevation (STEMI) myocardial infarction of unspecified site: Secondary | ICD-10-CM

## 2016-04-10 DIAGNOSIS — R002 Palpitations: Secondary | ICD-10-CM | POA: Diagnosis not present

## 2016-04-10 DIAGNOSIS — I10 Essential (primary) hypertension: Secondary | ICD-10-CM

## 2016-04-10 DIAGNOSIS — I119 Hypertensive heart disease without heart failure: Secondary | ICD-10-CM | POA: Diagnosis not present

## 2016-04-10 DIAGNOSIS — I513 Intracardiac thrombosis, not elsewhere classified: Secondary | ICD-10-CM

## 2016-04-10 MED ORDER — POTASSIUM CHLORIDE CRYS ER 20 MEQ PO TBCR: 20.0000 meq | EXTENDED_RELEASE_TABLET | ORAL | 11 refills | Status: DC

## 2016-04-10 MED ORDER — LISINOPRIL 5 MG PO TABS: 5.0000 mg | ORAL_TABLET | Freq: Every day | ORAL | 11 refills | Status: DC

## 2016-04-10 MED ORDER — DILTIAZEM HCL ER COATED BEADS 120 MG PO CP24: 120.0000 mg | ORAL_CAPSULE | Freq: Every day | ORAL | 11 refills | Status: DC

## 2016-04-10 NOTE — Progress Notes (Signed)
PCP: KNAPP,EVE A, MD  Clinic Note: Chief Complaint  Patient presents with  . Follow-up    lisinopril- qd or bid, eliquis is it still needed?, no chest pain, occassional edema in feet and ankles, has occassional pain in legs when walking    1. Right atrial thrombus ([place])   2. Essential hypertension, benign   3. Hypertensive heart disease without heart failure   4. Palpitations     HPI: Olivia [name] is a 50 y.o. female with a PMH below who presents today for 42 month-followup for Hypertensive heart disease with diastolic dysfunction. She also history of right atrial thrombus back in 2014Likely related to a PICC line  I last saw her her in March 2016.  She was complaining of palpitations that was improved with diltiazem. Her ACE inhibitor dose was increased to 10 mg due to hypertension. She has been on Eliquis for her right atrial thrombus.  Studies Reviewed: PMH/PSH updated.  Echo 09/2014: Low normal LV function; grade 2 diastolic dysfunction; calcified right atrial mass; no change compared to 06/11/13.  Interval History: She presents here today for delayed follow-up, really no major complaints. She is actually wondering when she can stop her ELIQUIS. She does note some bruising associated with it. She also was wondering about needing to be on diltiazem. Her palpitations of deathly improved, but she does note a little bit decreased energy level. She really hasn't had any rapid irregular heartbeats or palpitations since I last saw her. She is questioning whether or not her lisinopril supposed to once a day or twice a day. The prescription said 5 twice a day as opposed to 5 once a day. Initially we plan on increasing it to 10 mg twice a day. But she is only taking it once daily.  Although her echo said that there was diastolic dysfunction, she really has not had any significant exertional dyspnea. She has some mild edema but usually is in her hands and feet only not ankle edema. She takes  when necessary Lasix with when necessary Klor-Con to keep her potassium levels normal.  She has some tingling and discomfort in her legs that's probably related to neuropathy  Cardiovascular ROS:   No chest pain or shortness of breath with rest or exertion.   No PND, orthopnea. No lightheadedness, dizziness, weakness or syncope/near syncope.  No TIA/amaurosis fugax symptoms.  ROS: A comprehensive was performed. Review of Systems  Constitutional: Negative for malaise/fatigue.  HENT: Negative for nosebleeds.   Respiratory: Negative for cough, shortness of breath and wheezing.   Cardiovascular: Negative for palpitations and claudication.  Gastrointestinal: Negative for blood in stool and melena.  Genitourinary: Negative for hematuria.  Neurological: Positive for tingling (Shooting type pains down from her back into her legs mostly in the thighs and hips.). Negative for dizziness and loss of consciousness.  Endo/Heme/Allergies: Bruises/bleeds easily.  Psychiatric/Behavioral: Negative.  Negative for memory loss. The patient is not nervous/anxious and does not have insomnia.   All other systems reviewed and are negative.  Past Medical History:  Diagnosis Date  . Breast cancer, stage 1 ([place])   . Colon polyp   . GERD (gastroesophageal reflux disease)   . H/O diastolic dysfunction nov 2014   1.3 x 1.3 cm mass in RA on Echo -->Cardiac MRI 12/'04: 1.9 x 1.6 cm mass in the lateral RA cavity. -- Most suggestive of thrombus--  calcified and  lamiina and ted suggestive of chronic.; EF from 50-4% with no scar.  [name] History  of breast cancer 2010   invasive ductal R breast; s/p lumpectomy, radiation and chemo  . Lumbar degenerative disc disease   . Obesity   . Obesity   . Right atrial thrombus ([place]) 05/2013   f/u Echo 09/2014: Low normal LV function; grade 2 diastolic dysfunction; calcified right atrial mass; no change compared to 06/11/13.  [name] Uterine fibroid    s/p embolization   Past Surgical  History:  Procedure Laterality Date  . BREAST LUMPECTOMY  2010   right  . Cardiac MRI  December 2014   1.9 x 1.6 cm mass in the lateral RA cavity. -- Most suggestive of thrombus--  calcified anndd  laammiinnaated suggestive of chronic.; EF from 50-4% with no scar. Trivial effusion  . COLONOSCOPY  2010  . LUMBAR LAMINECTOMY/DECOMPRESSION MICRODISCECTOMY  2007, 2008   Dr. Hal Neer x 2  . POWER PORT    . TRANSTHORACIC ECHOCARDIOGRAM  November 2014; March 2016t   a) Mild LVH, EF of roughly 50%. Pseudo-normal LV filling (grade 3 diastolic soft. Mild LA dilation. 1.3 x 1.3 cm mass in right atrium.;; b) Low normal LV function; grade 2 diastolic dysfunction; calcified right atrial mass; no change compared to 06/11/13.  . TUBAL LIGATION  2010  . UTERINE FIBROID EMBOLIZATION  2008 or 2009     Prior to Admission medications   Medication Sig Start Date End Date Taking? Authorizing Provider  ascorbic acid (VITAMIN C) 250 MG tablet Take 500 mg by mouth daily.   Yes Historical Provider, MD  BIOTIN PO Take 1 tablet by mouth daily.   Yes Historical Provider, MD  Calcium Carbonate-Vitamin D (CALCIUM + D) 600-200 MG-UNIT TABS Take 2 tablets by mouth daily.     Yes Historical Provider, MD  cetirizine (ZYRTEC) 10 MG tablet Take 10 mg by mouth daily.   Yes Historical Provider, MD  clobetasol ointment (TEMOVATE) 0.05 %  03/15/14  Yes Historical Provider, MD  diltiazem (CARDIZEM CD) 180 MG 24 hr capsule Take 1 capsule by mouth daily 02/06/16  Yes Leonie Man, MD  ELIQUIS 5 MG TABS tablet TAKE 1 TABLET BY MOUTH TWICE A DAY 12/12/15  Yes Leonie Man, MD  furosemide (LASIX) 40 MG tablet TAKE 1 TABLET (40 MG) BY MOUTH EVERY OTHER DAY. PLEASE SCHEDULE APPOINTMENT FOR REFILLS. 02/14/16  Yes Leonie Man, MD  gabapentin (NEURONTIN) 600 MG tablet Take 600 mg by mouth at bedtime.   Yes Historical Provider, MD  lidocaine (LIDODERM) 5 %  04/30/14  Yes Historical Provider, MD  lisinopril (PRINIVIL,ZESTRIL) 5 MG tablet  TAKE 1 TABLET (5 MG TOTAL) BY MOUTH DAILY  04/03/16  Yes Leonie Man, MD  Multiple Vitamins-Minerals (MULTIVITAMIN WITH MINERALS) tablet Take 1 tablet by mouth daily.     Yes Historical Provider, MD  nabumetone (RELAFEN) 500 MG tablet Take 500 mg by mouth 2 (two) times daily.     Yes Historical Provider, MD  NEXIUM 40 MG capsule TAKE ONE CAPSULE BY MOUTH EVERY DAY 07/16/14  Yes Camelia Eng Tysinger, PA-C  nitrofurantoin, macrocrystal-monohydrate, (MACROBID) 100 MG capsule Take 1 capsule (100 mg total) by mouth 2 (two) times daily. 10/24/15  Yes Girtha Rm, NP  potassium chloride SA (K-DUR,KLOR-CON) 20 MEQ tablet Take 20 mEq by mouth every other day.    Yes Historical Provider, MD  scopolamine (TRANSDERM-SCOP) 1 MG/3DAYS Place 1 patch (1.5 mg total) onto the skin every 3 (three) days. 12/22/15  Yes Rita Ohara, MD  tapentadol (NUCYNTA) 50 MG TABS tablet Take  50 mg by mouth every 6 (six) hours as needed.   Yes Historical Provider, MD  triamcinolone cream (KENALOG) 0.1 %  03/26/14  Yes Historical Provider, MD  UNABLE TO FIND 1 tablet. i Cool supplement  Take 1 tablet daily for hot flashes   Yes Historical Provider, MD  vitamin E 400 UNIT capsule Take 400 Units by mouth daily.   Yes Historical Provider, MD    Allergies  Allergen Reactions  . Adhesive [Tape] Hives  . Morphine Hives   Social History  Substance Use Topics  . Smoking status: Never Smoker  . Smokeless tobacco: Never Used  . Alcohol use No   Family History  Problem Relation Age of Onset  . Breast cancer Mother   . Cancer Maternal Grandmother     renal cancer   Wt Readings from Last 3 Encounters:  04/10/16 193 lb (87.5 kg)  12/16/15 184 lb 8 oz (83.7 kg)  10/24/15 185 lb 3.2 oz (84 kg)    PHYSICAL EXAM BP 108/70 (BP Location: Left Arm, Patient Position: Sitting, Cuff Size: Normal)   Pulse 72   Ht 5\' 6"  (1.676 m)   Wt 193 lb (87.5 kg)   BMI 31.15 kg/m  General appearance: alert, cooperative, appears stated age, no  distress, mildly obese and Healthy-appearing. Answered questions appropriately. HEENT: [place]/AT, EOMI, MMM, anicteric sclera Neck: no adenopathy, no carotid bruit, no JVD and supple, symmetrical, trachea midline Lungs: CTAB, normal percussion bilaterally and Nonlabored; good air movement Heart: RRR, normal S1 and S2 with a soft S4 gallop. There is a soft systolic murmur heard right along the precordium. Nondisplaced PMI  Abdomen: soft, non-tender; bowel sounds normal; no masses, no organomegaly Extremities: No clubbing or cyanosis. Trace edema Pulses: 2+ and symmetric Neurologic: Alert and oriented X 3, normal strength and tone. Normal symmetric reflexes. Normal coordination and gait; Pleasant mood and affect   Adult ECG Report  Rate: 73 ;  Rhythm: normal sinus rhythm and Nonspecific diffuse T wave flattening  Narrative Interpretation: stable EKG  Recent Labs:  Has not had any checks that she is aware of in some time now. She is not aware that anyone is check her lipids  ASSESSMENT / PLAN: Problem List Items Addressed This Visit    Right atrial thrombus ([place]) - Primary (Chronic)    Stable calcified/laminated old thrombus noted on follow-up echocardiogram. I think she is safe to stop the ELIQUIS.      Relevant Medications   lisinopril (PRINIVIL,ZESTRIL) 5 MG tablet   diltiazem (CARDIZEM CD) 120 MG 24 hr capsule   Palpitations (Chronic)    Likely PACs or PVCs. Notably improved with diltiazem. Since she is having a little bit of fatigue symptoms and some swelling to reduce her dose from 180 mg 120 mg.      Hypertensive heart disease - diastolic dysfunction noted on echo (Chronic)    Really no significant symptoms. We started ACE inhibitor and increased it because of blood pressure issues, but now her blood pressures well controlled. Admitted just have her simply take 5 mg once daily lisinopril in addition to the diltiazem. In fact underwent a reduced dose of diltiazem.  I think her edema  is probably more related to diltiazem or venous insufficiency and not cardiac as she has no PND or orthopnea. Continue with When necessary Lasix      Relevant Medications   lisinopril (PRINIVIL,ZESTRIL) 5 MG tablet   diltiazem (CARDIZEM CD) 120 MG 24 hr capsule   Essential hypertension, benign (  Chronic)   Relevant Medications   lisinopril (PRINIVIL,ZESTRIL) 5 MG tablet   diltiazem (CARDIZEM CD) 120 MG 24 hr capsule   Other Relevant Orders   EKG 12-Lead (Completed)    Other Visit Diagnoses   None.     Orders Placed This Encounter  Procedures  . EKG 12-Lead    Order Specific Question:   Where should this test be performed    Answer:   Other   Meds ordered this encounter  Medications  . lisinopril (PRINIVIL,ZESTRIL) 5 MG tablet    Sig: Take 1 tablet (5 mg total) by mouth daily.    Dispense:  30 tablet    Refill:  11  . diltiazem (CARDIZEM CD) 120 MG 24 hr capsule    Sig: Take 1 capsule (120 mg total) by mouth daily.    Dispense:  30 capsule    Refill:  11  . potassium chloride SA (K-DUR,KLOR-CON) 20 MEQ tablet    Sig: Take 1 tablet (20 mEq total) by mouth every other day.    Dispense:  15 tablet    Refill:  11   Patient Instructions:   STOP Eliquis.  Decrease diltiazem to 120 mg daily.  Take lisinopril once daily.   Follow-Up: 1 year with Dr. Ellyn Hack.   Followup: 1 yr      Glenetta Hew, M.D., M.S. Interventional Cardiologist   Pager # [phone]

## 2016-04-10 NOTE — Patient Instructions (Addendum)
Medications:  STOP Eliquis.  Decrease diltiazem to 120 mg daily.  Take lisinopril once daily.   Follow-Up:  Your physician wants you to follow-up in: 1 year with Dr. Ellyn Hack. You will receive a reminder letter in the mail two months in advance. If you don't receive a letter, please call our office to schedule the follow-up appointment. If you need a refill on your cardiac medications before your next appointment, please call your pharmacy.

## 2016-04-12 ENCOUNTER — Encounter: Payer: Self-pay | Admitting: Cardiology

## 2016-04-12 NOTE — Assessment & Plan Note (Addendum)
Really no significant symptoms. We started ACE inhibitor and increased it because of blood pressure issues, but now her blood pressures well controlled. Admitted just have her simply take 5 mg once daily lisinopril in addition to the diltiazem. In fact underwent a reduced dose of diltiazem.  I think her edema is probably more related to diltiazem or venous insufficiency and not cardiac as she has no PND or orthopnea. Continue with When necessary Lasix

## 2016-04-12 NOTE — Assessment & Plan Note (Signed)
Stable calcified/laminated old thrombus noted on follow-up echocardiogram. I think she is safe to stop the ELIQUIS.

## 2016-04-12 NOTE — Assessment & Plan Note (Signed)
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 Notably improved with diltiazem. Since she is having a little bit of fatigue symptoms and some swelling to reduce her dose from 180 mg 120 mg.

## 2016-05-06 ENCOUNTER — Other Ambulatory Visit: Payer: Self-pay | Admitting: Cardiology

## 2016-05-08 ENCOUNTER — Encounter: Payer: Self-pay | Admitting: Physician Assistant

## 2016-05-08 ENCOUNTER — Ambulatory Visit (INDEPENDENT_AMBULATORY_CARE_PROVIDER_SITE_OTHER): Payer: PRIVATE HEALTH INSURANCE | Admitting: Physician Assistant

## 2016-05-08 ENCOUNTER — Telehealth: Payer: Self-pay | Admitting: Cardiology

## 2016-05-08 VITALS — BP 136/84 | HR 81 | Ht 66.0 in | Wt 193.4 lb

## 2016-05-08 DIAGNOSIS — R0602 Shortness of breath: Secondary | ICD-10-CM | POA: Diagnosis not present

## 2016-05-08 DIAGNOSIS — R002 Palpitations: Secondary | ICD-10-CM

## 2016-05-08 MED ORDER — FUROSEMIDE 40 MG PO TABS: 40.0000 mg | ORAL_TABLET | Freq: Every day | ORAL | 11 refills | Status: DC

## 2016-05-08 MED ORDER — DILTIAZEM HCL ER COATED BEADS 180 MG PO CP24
180.0000 mg | ORAL_CAPSULE | Freq: Every day | ORAL | 11 refills | Status: DC
Start: 2016-05-08 — End: 2017-05-01

## 2016-05-08 NOTE — Telephone Encounter (Signed)
New message     Pt c/o medication issue:  1. Name of Medication: diltiazem (CARDIZEM CD) 120 MG 24 hr capsule  2. How are you currently taking this medication (dosage and times per day)? 120 mg    3. Are you having a reaction (difficulty breathing--STAT)?breathing   4. What is your medication issue? Wants to discuss with nurse.

## 2016-05-08 NOTE — Progress Notes (Signed)
Cardiology Office Note   Date:  05/08/2016   ID:  Olivia [name], DOB April 19, 1966, MRN [id number]  PCP:  [name], MD  Cardiologist:  Dr Ellyn Hack 04/10/2016 Rosaria Ferries, PA-C   Chief Complaint  Patient presents with  . Shortness of Breath    pt states she gets SOB when moving around  . Edema    both feets and ankles     History of Present Illness: Olivia [name] is a 50 y.o. female with a history of R atrial thrombus, EF 50-55% w/ grade 2 dd & calcified mass unchanged in 2016 echo, Eliquis d/c'd since mass calcified and unchanged > 2 yr, breast CA, GERD  Seen by Dr Ellyn Hack 09/12, Eliquis d/c'd and Cardizem decreased 180 mg>>120 mg. Occ LE edema rx w/ prn Lasix.  Pt called today w/ palpitations and SOB, appt arranged.  Olivia [name] presents for cardiology evaluation and follow up.  Since her Cardizem was decreased, she has had more palpitatons. They are not long runs, generally 1-2 beats at a time. She has also noticed increased DOE. Her heart rate has been higher and she feels it beating faster. She has not had chest pain. She has not had significant LE edema and denies orthopnea or PND.    Past Medical History:  Diagnosis Date  . Breast cancer, stage 1 ([place])   . Colon polyp   . GERD (gastroesophageal reflux disease)   . H/O diastolic dysfunction [id number]   Grade 2 DD. 1.3 x 1.3 cm mass in RA on Echo -->Cardiac MRI 12/04: 1.9 x 1.6 cm mass in the lateral RA cavity. -- Most suggestive of thrombus--  calcified and  lamina and ted suggestive of chronic.; EF from 50% with no scar.  [name] History of breast cancer 2010   invasive ductal R breast; s/p lumpectomy, radiation and chemo  . Lumbar degenerative disc disease   . Obesity   . 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  [name] Uterine fibroid    s/p embolization    Past Surgical History:  Procedure  Laterality Date  . BREAST LUMPECTOMY  2010   right  . Cardiac MRI  December 2014   1.9 x 1.6 cm mass in the lateral RA cavity. -- Most suggestive of thrombus--  calcified anndd  laammiinnaated suggestive of chronic.; EF from 50-4% with no scar. Trivial effusion  . COLONOSCOPY  2010  . LUMBAR LAMINECTOMY/DECOMPRESSION MICRODISCECTOMY  2007, 2008   Dr. Hal Neer x 2  . POWER PORT    . TRANSTHORACIC ECHOCARDIOGRAM  November 2014; March 2016t   a) Mild LVH, EF of roughly 50%. Pseudo-normal LV filling (grade 3 diastolic soft. Mild LA dilation. 1.3 x 1.3 cm mass in right atrium.;; b) Low normal LV function; grade 2 diastolic dysfunction; calcified right atrial mass; no change compared to 06/11/13.  . TUBAL LIGATION  2010  . UTERINE FIBROID EMBOLIZATION  2008 or 2009    Current Outpatient Prescriptions  Medication Sig Dispense Refill  . ascorbic acid (VITAMIN C) 250 MG tablet Take 500 mg by mouth daily.    [name] BIOTIN PO Take 1 tablet by mouth daily.    . Calcium Carbonate-Vitamin D (CALCIUM + D) 600-200 MG-UNIT TABS Take 2 tablets by mouth daily.      . cetirizine (ZYRTEC) 10 MG tablet Take 10 mg by mouth daily.    [name]  clobetasol ointment (TEMOVATE) 0.05 %   3  . diltiazem (CARDIZEM CD) 120 MG 24 hr capsule Take 1 capsule (120 mg total) by mouth daily. 30 capsule 11  . furosemide (LASIX) 40 MG tablet TAKE 1 TABLET (40 MG) BY MOUTH EVERY OTHER DAY. PLEASE SCHEDULE APPOINTMENT FOR REFILLS. 30 tablet 0  . gabapentin (NEURONTIN) 600 MG tablet Take 600 mg by mouth at bedtime.    . lidocaine (LIDODERM) 5 %   5  . lisinopril (PRINIVIL,ZESTRIL) 5 MG tablet Take 1 tablet (5 mg total) by mouth daily. 30 tablet 11  . Multiple Vitamins-Minerals (MULTIVITAMIN WITH MINERALS) tablet Take 1 tablet by mouth daily.      . nabumetone (RELAFEN) 500 MG tablet Take 500 mg by mouth 2 (two) times daily.      [name] NEXIUM 40 MG capsule TAKE ONE CAPSULE BY MOUTH EVERY DAY 30 capsule 0  . nitrofurantoin, macrocrystal-monohydrate,  (MACROBID) 100 MG capsule Take 1 capsule (100 mg total) by mouth 2 (two) times daily. 10 capsule 0  . potassium chloride SA (K-DUR,KLOR-CON) 20 MEQ tablet Take 1 tablet (20 mEq total) by mouth every other day. 15 tablet 11  . scopolamine (TRANSDERM-SCOP) 1 MG/3DAYS Place 1 patch (1.5 mg total) onto the skin every 3 (three) days. 4 patch 0  . tapentadol (NUCYNTA) 50 MG TABS tablet Take 50 mg by mouth every 6 (six) hours as needed.    . triamcinolone cream (KENALOG) 0.1 %   1  . UNABLE TO FIND 1 tablet. i Cool supplement  Take 1 tablet daily for hot flashes    . vitamin E 400 UNIT capsule Take 400 Units by mouth daily.     No current facility-administered medications for this visit.     Allergies:   Adhesive [tape] and Morphine    Social History:  The patient  reports that she has never smoked. She has never used smokeless tobacco. She reports that she does not drink alcohol or use drugs.   Family History:  The patient's family history includes Breast cancer in her mother; Cancer in her maternal grandmother.    ROS:  Please see the history of present illness. All other systems are reviewed and negative.    PHYSICAL EXAM: VS:  BP (!) 144/98   Pulse 81   Ht 5\' 6"  (1.676 m)   Wt 193 lb 6.4 oz (87.7 kg)   BMI 31.22 kg/m  , BMI Body mass index is 31.22 kg/m. GEN: Well nourished, well developed, female in no acute distress  HEENT: normal for age  Neck: no JVD, no carotid bruit, no masses Cardiac: RRR; soft murmur, no rubs, or gallops Respiratory:  clear to auscultation bilaterally, normal work of breathing GI: soft, nontender, nondistended, + BS MS: no deformity or atrophy; no edema; distal pulses are 2+ in all 4 extremities   Skin: warm and dry, no rash Neuro:  Strength and sensation are intact Psych: euthymic mood, full affect   EKG:  EKG is ordered today. The ekg ordered today demonstrates SR, rate 81, not aucte changes.   ECHO: 09/2014 - Left ventricle: The cavity size was  normal. Wall thickness was normal. Systolic function was normal. The estimated ejection fraction was in the range of 50% to 55%. Wall motion was normal; there were no regional wall motion abnormalities. Features are consistent with a pseudonormal left ventricular filling pattern, with concomitant abnormal relaxation and increased filling pressure (grade 2 diastolic dysfunction). Impressions: - Low normal LV function; grade 2 diastolic  dysfunction; calcified right atrial mass; no change compared to 06/11/13.  Recent Labs: 12/16/2015: ALT 16; BUN 9.8; Creatinine 0.9; HGB 13.7; Platelets 234; Potassium 3.5; Sodium 141    Lipid Panel    Component Value Date/Time   CHOL 297 (H) 08/22/2011 0000   TRIG 168 (H) 08/22/2011 0000   HDL 52 08/22/2011 0000   CHOLHDL 5.7 08/22/2011 0000   VLDL 34 08/22/2011 0000   LDLCALC 211 (H) 08/22/2011 0000     Wt Readings from Last 3 Encounters:  05/08/16 193 lb 6.4 oz (87.7 kg)  04/10/16 193 lb (87.5 kg)  12/16/15 184 lb 8 oz (83.7 kg)     Other studies Reviewed: Additional studies/ records that were reviewed today include: office notes and other records.  ASSESSMENT AND PLAN:  1.  Palpitations: They have been more frequent on a lower dose of Cardizem. We will increase the Cardizem back to previous levels. She had some fatigue and a small amount of LE edema, reason for the decrease, but thinks she felt better in general on the higher dose. If this does not work, we can try Verapamil.   2. HTN: her BP is a little elevated today, follow on the increased Cardizem. Continue Lasix and lisinopril with no dose change for now.   Current medicines are reviewed at length with the patient today.  The patient does not have concerns regarding medicines.  The following changes have been made:  Increase Cardizem  Labs/ tests ordered today include:  No orders of the defined types were placed in this encounter.    Disposition:   FU with Dr  Ellyn Hack  Signed, Rosaria Ferries, PA-C  05/08/2016 3:08 PM    [facility] Phone: [phone]; Fax: [phone]  This note was written with the assistance of speech recognition software. Please excuse any transcriptional errors.

## 2016-05-08 NOTE — Telephone Encounter (Signed)
Received call from patient.She stated she is having sob,hard to walk from room to room.Also having palpitations.Stated she noticed symptoms when diltiazem decreased to 120 mg daily.Stated she was doing good at last visit with Dr.Harding.Appointment scheduled with Rosaria Ferries PA this afternoon at 2:30 pm.

## 2016-05-08 NOTE — Patient Instructions (Addendum)
Medication Instructions:   START TAKING DILTIAZEM  (CARDIZEM).. 180 MG  ONCE A DAY   If you need a refill on your cardiac medications before your next appointment, please call your pharmacy.  Labwork:  NONE ORDER TODAY    Testing/Procedures: NONE ORDER TODAY    Follow-Up: WITH DR [facility] IN September AS SCHEDULE  You will receive a reminder letter in the mail two months in advance. If you don't receive a letter, please call our office to schedule the follow-up appointment.        Any Other Special Instructions Will Be Listed Below (If Applicable).

## 2016-05-13 ENCOUNTER — Encounter: Payer: Self-pay | Admitting: Physician Assistant

## 2016-06-11 NOTE — Progress Notes (Signed)
[facility]  Clinical follow Up note  [phone] [phone]     ID: [name] OB: [birth year]  MR#: [id number]  ZPH#:[id number]  PCP: [name], MD GYN:  Dr. Garwin Brothers SU: Dr. Erroll Luna OTHER MD: Dr. Wyline Mood oncology  CHIEF COMPLAINT:  50 year old [place] woman with right sided T2 N0, stage IIA, grade III, invasive ductal carcinoma, ER negative, PR negative, Ki-67 77%, HER-2/neu negative.  BREAST CANCER HISTORY:  #1 The patient noted a new palpable mass in her right breast and underwent a diagnostic mammogram in January, 2010.  Biopsy was consistent with invasive ductal carcinoma.  A MRI of the breasts revealed a 2.4 cm right breast mass and a satellite lesion.  At 6 o'clock however, a round oval mass was seen on MRI and a biopsy determined it was not malignant.  Patient underwent a right breast lumpectomy on 09/17/2008 and a 2.3 and 1.7 cm grade III invasive ductal carcinoma was removed.  One sentinel node was negative.  The tumor was ER negative, PR negative, Ki-67 of 77%, HER-2/neu negative.    #2 Patient underwent a right breast lumpectomy on 09/17/2008 and a 2.3 and 1.7 cm grade III invasive ductal carcinoma was removed.  One sentinel node was negative.  The tumor was ER negative, PR negative, Ki-67 of 77%, HER-2/neu negative.    #3. Patient then received adjuvant chemotherapy consisting of Adriamycin Cytoxan given dose dense x4 cycles followed by Taxotere (at her best recollection) which she completed in September 2010.   #4 Patient underwent adjuvant radiation therapy that completed in November 2010.  CURRENT THERAPY: observation  INTERVAL HISTORY:   Olivia [name] returns for follow-up. She was last seen by me 6 months ago. She is doing moderately well overall. She still has moderate fatigue and mild dyspnea on exertion, which is stable overall. She denies any other new pain, or other discomfort. She has good appetite and  weight is stable.  REVIEW OF SYSTEMS:  A 10 point review of systems was conducted and is otherwise negative except for what is noted above.     PAST MEDICAL HISTORY: Past Medical History:  Diagnosis Date  . Breast cancer, stage 1 ([place])   . Colon polyp   . GERD (gastroesophageal reflux disease)   . H/O diastolic dysfunction 80/1655   Grade 2 DD. 1.3 x 1.3 cm mass in RA on Echo -->Cardiac MRI 12/04: 1.9 x 1.6 cm mass in the lateral RA cavity. -- Most suggestive of thrombus--  calcified and  lamina and ted suggestive of chronic.; EF from 50% with no scar.  [name] History of breast cancer 2010   invasive ductal R breast; s/p lumpectomy, radiation and chemo  . Lumbar degenerative disc disease   . Obesity   . 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  [name] Uterine fibroid    s/p embolization    PAST SURGICAL HISTORY: Past Surgical History:  Procedure Laterality Date  . BREAST LUMPECTOMY  2010   right  . Cardiac MRI  December 2014   1.9 x 1.6 cm mass in the lateral RA cavity. -- Most suggestive of thrombus--  calcified anndd  laammiinnaated suggestive of chronic.; EF from 50-4% with no scar. Trivial effusion  . COLONOSCOPY  2010  . LUMBAR LAMINECTOMY/DECOMPRESSION MICRODISCECTOMY  2007, 2008   Dr. Hal Neer x 2  . POWER PORT    .  TRANSTHORACIC ECHOCARDIOGRAM  November 2014; March 2016t   a) Mild LVH, EF of roughly 50%. Pseudo-normal LV filling (grade 3 diastolic soft. Mild LA dilation. 1.3 x 1.3 cm mass in right atrium.;; b) Low normal LV function; grade 2 diastolic dysfunction; calcified right atrial mass; no change compared to 06/11/13.  . TUBAL LIGATION  2010  . UTERINE FIBROID EMBOLIZATION  2008 or 2009    FAMILY HISTORY Family History  Problem Relation Age of Onset  . Breast cancer Mother   . Cancer Maternal Grandmother     renal cancer    GYNECOLOGIC  HISTORY:  Menarche at age 13, G31 P1, No history of estrogen replacement therapy, no history of abnormal pap smears, or sexually transmitted infections  SOCIAL HISTORY:  The patient is married to her husband Olivia [name] for 10 years.  Lives with him in a two story house.  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    ADVANCED DIRECTIVES: Not in place.     HEALTH MAINTENANCE: Social History  Substance Use Topics  . Smoking status: Never Smoker  . Smokeless tobacco: Never Used  . Alcohol use No    Mammogram: 10/31/2015 Colonoscopy: 06/2009  Bone Density Scan: 01/05/2014 Pap Smear: June, 2015 Eye Exam: 01/2013 Vitamin D Level: previously normal  Lipid Panel: unknown   Allergies  Allergen Reactions  . Adhesive [Tape] Hives  . Morphine Hives    Current Outpatient Prescriptions  Medication Sig Dispense Refill  . ascorbic acid (VITAMIN C) 250 MG tablet Take 500 mg by mouth daily.    [name] BIOTIN PO Take 1 tablet by mouth daily.    . Calcium Carbonate-Vitamin D (CALCIUM + D) 600-200 MG-UNIT TABS Take 2 tablets by mouth daily.      . cetirizine (ZYRTEC) 10 MG tablet Take 10 mg by mouth daily.    . clobetasol ointment (TEMOVATE) 0.05 %   3  . diltiazem (CARDIZEM CD) 180 MG 24 hr capsule Take 1 capsule (180 mg total) by mouth daily. 30 capsule 11  . furosemide (LASIX) 40 MG tablet Take 1 tablet (40 mg total) by mouth daily. 30 tablet 11  . gabapentin (NEURONTIN) 600 MG tablet Take 600 mg by mouth at bedtime.    . lidocaine (LIDODERM) 5 %   5  . lisinopril (PRINIVIL,ZESTRIL) 5 MG tablet Take 1 tablet (5 mg total) by mouth daily. 30 tablet 11  . Multiple Vitamins-Minerals (MULTIVITAMIN WITH MINERALS) tablet Take 1 tablet by mouth daily.      . nabumetone (RELAFEN) 500 MG tablet Take 500 mg by mouth 2 (two) times daily.      [name] NEXIUM 40 MG capsule TAKE ONE CAPSULE BY MOUTH EVERY DAY 30 capsule 0  . nitrofurantoin,  macrocrystal-monohydrate, (MACROBID) 100 MG capsule Take 1 capsule (100 mg total) by mouth 2 (two) times daily. 10 capsule 0  . potassium chloride SA (K-DUR,KLOR-CON) 20 MEQ tablet Take 1 tablet (20 mEq total) by mouth every other day. 15 tablet 11  . scopolamine (TRANSDERM-SCOP) 1 MG/3DAYS Place 1 patch (1.5 mg total) onto the skin every 3 (three) days. 4 patch 0  . tapentadol (NUCYNTA) 50 MG TABS tablet Take 50 mg by mouth every 6 (six) hours as needed.    . triamcinolone cream (KENALOG) 0.1 %   1  . UNABLE TO FIND 1 tablet. i Cool supplement  Take 1 tablet daily for hot flashes    . vitamin E 400 UNIT  capsule Take 400 Units by mouth daily.     No current facility-administered medications for this visit.     OBJECTIVE: There were no vitals filed for this visit.   There is no height or weight on file to calculate BMI.     GENERAL: Patient is a well appearing female in no acute distress HEENT:  Sclerae anicteric.  Oropharynx clear and moist. No ulcerations or evidence of oropharyngeal candidiasis. Neck is supple.  NODES:  No cervical, supraclavicular, or axillary lymphadenopathy palpated.  BREAST EXAM:  Right breast without nodules, masses, left breast without nodules or masses, benign bilateral breast exam.  LUNGS:  Clear to auscultation bilaterally.  No wheezes or rhonchi. HEART:  Regular rate and rhythm. No murmur appreciated. ABDOMEN:  Soft, nontender.  Positive, normoactive bowel sounds. No organomegaly palpated. MSK:  No focal spinal tenderness to palpation. Full range of motion bilaterally in the upper extremities. EXTREMITIES:  No peripheral edema.   SKIN:  Clear with no obvious rashes or skin changes. No nail dyscrasia. NEURO:  Nonfocal. Well oriented.  Appropriate affect. Chest wall: Her left anterior rib cage is slightly protruded, but no palpable soft tissue or bone lesion, nontender. The left side lateral chest wall are nontender, no palpable chest wall lesion or skin  changes.   ECOG FS:1 - Symptomatic but completely ambulatory    LAB RESULTS: CBC Latest Ref Rng & Units 06/12/2016 12/16/2015 03/28/2015  WBC 3.9 - 10.3 10e3/uL 6.3 6.4 7.0  Hemoglobin 11.6 - 15.9 g/dL 12.9 13.7 13.5  Hematocrit 34.8 - 46.6 % 38.8 40.5 39.0  Platelets 145 - 400 10e3/uL 264 234 290    CMP Latest Ref Rng & Units 06/12/2016 12/16/2015 03/28/2015  Glucose 70 - 140 mg/dl 86 77 85  BUN 7.0 - 26.0 mg/dL 9.7 9.8 9  Creatinine 0.6 - 1.1 mg/dL 0.9 0.9 0.84  Sodium 136 - 145 mEq/L 142 141 140  Potassium 3.5 - 5.1 mEq/L 4.3 3.5 3.6  Chloride 98 - 110 mmol/L - - 104  CO2 22 - 29 mEq/L 27 29 27   Calcium 8.4 - 10.4 mg/dL 9.4 9.4 9.4  Total Protein 6.4 - 8.3 g/dL 7.6 7.6 6.9  Total Bilirubin 0.20 - 1.20 mg/dL 0.33 0.41 0.3  Alkaline Phos 40 - 150 U/L 83 68 73  AST 5 - 34 U/L 17 16 16   ALT 0 - 55 U/L 15 16 15      RADIOLOGY STUDIES: PET 05/06/2015 IMPRESSION: 1. Two areas of mild increased uptake within the right breast likely reflects the post biopsy changes. Correlation with routine breast cancer screening recommended. 2. No specific findings identified to suggest hypermetabolic metastasis. 3. Asymmetric increased uptake localizing to the superior articulating facet of the L2 vertebra is likely related to degenerative change. If there is a clinical concern for bone metastasis then this could be better assessed with lumbar spine MRI.  Bilateral diagnostic mammogram 10/31/2015  IMPRESSION: 1. No evidence of recurrent or new breast malignancy. 2. Benign postsurgical changes on the right. New  RECOMMENDATION: Screening mammogram in one year.(Code:[id number]-B-01Y)   ASSESSMENT: 50 y.o.  [place], [place] woman   1. History of right breast cancer, T2N0M0, stage IIA, grade III, invasive ductal carcinoma, ER negative, PR negative, Ki-67 77%, HER-2/neu negative. -She is now 7 years out of her initial diagnosis. Her physical exam and recent mammogram showed no evidence of  recurrence. -she had a PET scan for her right-sided chest pain in October 2016, which was negative for disease recurrence. -We'll continue  surveillance, with annual screening mammogram and a physical exam. She prefers to follow up with [place]. -I encouraged her to have healthy diet and exercise.  3. 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 -Her recent echo showed EF 50-55%. -She is clinically doing well, we'll continue follow-up with cardiology.  Follow-up:  I'll see her back in 12 months with lab and exam. She is due for mammogram in April 2018  I spent 15 minutes counseling the patient face to face.  The total time spent in the appointment was 20 minutes.  Truitt Merle   06/12/2016

## 2016-06-12 ENCOUNTER — Ambulatory Visit (HOSPITAL_BASED_OUTPATIENT_CLINIC_OR_DEPARTMENT_OTHER): Payer: PRIVATE HEALTH INSURANCE | Admitting: Hematology

## 2016-06-12 ENCOUNTER — Other Ambulatory Visit (HOSPITAL_BASED_OUTPATIENT_CLINIC_OR_DEPARTMENT_OTHER): Payer: PRIVATE HEALTH INSURANCE

## 2016-06-12 VITALS — BP 120/79 | HR 74 | Temp 98.2°F | Resp 17 | Ht 66.0 in | Wt 196.8 lb

## 2016-06-12 DIAGNOSIS — Z7901 Long term (current) use of anticoagulants: Secondary | ICD-10-CM

## 2016-06-12 DIAGNOSIS — Z853 Personal history of malignant neoplasm of breast: Secondary | ICD-10-CM

## 2016-06-12 DIAGNOSIS — C50919 Malignant neoplasm of unspecified site of unspecified female breast: Secondary | ICD-10-CM

## 2016-06-12 LAB — COMPREHENSIVE METABOLIC PANEL
ALBUMIN: 3.5 g/dL (ref 3.5–5.0)
ALK PHOS: 83 U/L (ref 40–150)
ALT: 15 U/L (ref 0–55)
ANION GAP: 8 meq/L (ref 3–11)
AST: 17 U/L (ref 5–34)
BILIRUBIN TOTAL: 0.33 mg/dL (ref 0.20–1.20)
BUN: 9.7 mg/dL (ref 7.0–26.0)
CALCIUM: 9.4 mg/dL (ref 8.4–10.4)
CO2: 27 mEq/L (ref 22–29)
CREATININE: 0.9 mg/dL (ref 0.6–1.1)
Chloride: 107 mEq/L (ref 98–109)
Glucose: 86 mg/dl (ref 70–140)
Potassium: 4.3 mEq/L (ref 3.5–5.1)
Sodium: 142 mEq/L (ref 136–145)
TOTAL PROTEIN: 7.6 g/dL (ref 6.4–8.3)

## 2016-06-12 LAB — CBC WITH DIFFERENTIAL/PLATELET
BASO%: 0.8 % (ref 0.0–2.0)
Basophils Absolute: 0 10*3/uL (ref 0.0–0.1)
EOS ABS: 0.1 10*3/uL (ref 0.0–0.5)
EOS%: 1.2 % (ref 0.0–7.0)
HEMATOCRIT: 38.8 % (ref 34.8–46.6)
HEMOGLOBIN: 12.9 g/dL (ref 11.6–15.9)
LYMPH#: 2.7 10*3/uL (ref 0.9–3.3)
LYMPH%: 42.2 % (ref 14.0–49.7)
MCH: 30.6 pg (ref 25.1–34.0)
MCHC: 33.3 g/dL (ref 31.5–36.0)
MCV: 91.9 fL (ref 79.5–101.0)
MONO#: 0.4 10*3/uL (ref 0.1–0.9)
MONO%: 7 % (ref 0.0–14.0)
NEUT%: 48.8 % (ref 38.4–76.8)
NEUTROS ABS: 3.1 10*3/uL (ref 1.5–6.5)
PLATELETS: 264 10*3/uL (ref 145–400)
RBC: 4.22 10*6/uL (ref 3.70–5.45)
RDW: 12.6 % (ref 11.2–14.5)
WBC: 6.3 10*3/uL (ref 3.9–10.3)

## 2016-06-13 ENCOUNTER — Other Ambulatory Visit: Payer: Self-pay | Admitting: Hematology

## 2016-06-13 DIAGNOSIS — Z853 Personal history of malignant neoplasm of breast: Secondary | ICD-10-CM

## 2016-06-14 ENCOUNTER — Telehealth: Payer: Self-pay | Admitting: Hematology

## 2016-06-14 NOTE — Telephone Encounter (Signed)
lvm to inform pt of 11/21 appt per LOS. Advised pt to call office if date/time does not work to r/s appt

## 2016-06-17 ENCOUNTER — Encounter: Payer: Self-pay | Admitting: Hematology

## 2016-06-19 ENCOUNTER — Other Ambulatory Visit: Payer: Medicare Other

## 2016-06-19 ENCOUNTER — Encounter: Payer: Medicare Other | Admitting: Genetic Counselor

## 2016-06-20 ENCOUNTER — Telehealth: Payer: Self-pay | Admitting: Hematology

## 2016-06-20 NOTE — Telephone Encounter (Signed)
Appointment scheduled per 11/14 LOS. Returned call to patient regarding future scheduled appointments.

## 2016-08-13 ENCOUNTER — Ambulatory Visit (HOSPITAL_BASED_OUTPATIENT_CLINIC_OR_DEPARTMENT_OTHER): Payer: PRIVATE HEALTH INSURANCE | Admitting: Hematology

## 2016-08-13 ENCOUNTER — Telehealth: Payer: Self-pay | Admitting: Hematology

## 2016-08-13 ENCOUNTER — Encounter: Payer: Self-pay | Admitting: Hematology

## 2016-08-13 DIAGNOSIS — Z853 Personal history of malignant neoplasm of breast: Secondary | ICD-10-CM

## 2016-08-13 DIAGNOSIS — R21 Rash and other nonspecific skin eruption: Secondary | ICD-10-CM | POA: Diagnosis not present

## 2016-08-13 DIAGNOSIS — Z7901 Long term (current) use of anticoagulants: Secondary | ICD-10-CM | POA: Diagnosis not present

## 2016-08-13 NOTE — Progress Notes (Signed)
[place]  Clinical follow Up note  [phone] [phone]     ID: Olivia [name] OB: [birth year]  MR#: [id number]  TUU#:[id number]  PCP: [name], MD GYN:  Dr. Garwin Brothers SU: Dr. Erroll Luna OTHER MD: Dr. Wyline Mood oncology  CHIEF COMPLAINT:  51 year old [place] woman with right sided T2 N0, stage IIA, grade III, invasive ductal carcinoma, ER negative, PR negative, Ki-67 77%, HER-2/neu negative, is here because of new left breast rash  BREAST CANCER HISTORY:  #1 The patient noted a new palpable mass in her right breast and underwent a diagnostic mammogram in January, 2010.  Biopsy was consistent with invasive ductal carcinoma.  A MRI of the breasts revealed a 2.4 cm right breast mass and a satellite lesion.  At 6 o'clock however, a round oval mass was seen on MRI and a biopsy determined it was not malignant.  Patient underwent a right breast lumpectomy on 09/17/2008 and a 2.3 and 1.7 cm grade III invasive ductal carcinoma was removed.  One sentinel node was negative.  The tumor was ER negative, PR negative, Ki-67 of 77%, HER-2/neu negative.    #2 Patient underwent a right breast lumpectomy on 09/17/2008 and a 2.3 and 1.7 cm grade III invasive ductal carcinoma was removed.  One sentinel node was negative.  The tumor was ER negative, PR negative, Ki-67 of 77%, HER-2/neu negative.    #3. Patient then received adjuvant chemotherapy consisting of Adriamycin Cytoxan given dose dense x4 cycles followed by Taxotere (at her best recollection) which she completed in September 2010.   #4 Patient underwent adjuvant radiation therapy that completed in November 2010.  CURRENT THERAPY: observation  INTERVAL HISTORY:   Olivia [name] called earlier today and requested to be seen due to concern of her left breast rash. He started around Christmas of last year, initially was pinkish skin rash, no skin breakdown or discharge, no itchiness or pain, no  breast swollen, no palpable breast mass. The skin rash color gradually turned darker, and it has not resolved in the past 2-3 weeks. She again denies any pain, itchiness, or discharge. She still has moderate fatigue, no other new symptoms.  REVIEW OF SYSTEMS:  A 10 point review of systems was conducted and is otherwise negative except for what is noted above.     PAST MEDICAL HISTORY: Past Medical History:  Diagnosis Date  . Breast cancer, stage 1 ([place])   . Colon polyp   . GERD (gastroesophageal reflux disease)   . H/O diastolic dysfunction 79/1505   Grade 2 DD. 1.3 x 1.3 cm mass in RA on Echo -->Cardiac MRI 12/04: 1.9 x 1.6 cm mass in the lateral RA cavity. -- Most suggestive of thrombus--  calcified and  lamina and ted suggestive of chronic.; EF from 50% with no scar.  [name] History of breast cancer 2010   invasive ductal R breast; s/p lumpectomy, radiation and chemo  . Lumbar degenerative disc disease   . Obesity   . 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  [name] Uterine fibroid    s/p embolization    PAST SURGICAL HISTORY: Past Surgical History:  Procedure Laterality Date  . BREAST LUMPECTOMY  2010   right  . Cardiac MRI  December 2014   1.9 x 1.6 cm mass in the lateral RA cavity. -- Most suggestive of thrombus--  calcified anndd  laammiinnaated suggestive of chronic.; EF from 50-4% with no scar. Trivial effusion  . COLONOSCOPY  2010  . LUMBAR LAMINECTOMY/DECOMPRESSION MICRODISCECTOMY  2007, 2008   Dr. Hal Neer x 2  . POWER PORT    . TRANSTHORACIC ECHOCARDIOGRAM  November 2014; March 2016t   a) Mild LVH, EF of roughly 50%. Pseudo-normal LV filling (grade 3 diastolic soft. Mild LA dilation. 1.3 x 1.3 cm mass in right atrium.;; b) Low normal LV function; grade 2 diastolic dysfunction; calcified right atrial mass; no change compared to 06/11/13.  . TUBAL LIGATION   2010  . UTERINE FIBROID EMBOLIZATION  2008 or 2009    FAMILY HISTORY Family History  Problem Relation Age of Onset  . Breast cancer Mother   . Cancer Maternal Grandmother     renal cancer    GYNECOLOGIC HISTORY:  Menarche at age 53, G41 P1, No history of estrogen replacement therapy, no history of abnormal pap smears, or sexually transmitted infections  SOCIAL HISTORY:  The patient is married to her husband Olivia [name] for 10 years.  Lives with him in a two story house.  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    ADVANCED DIRECTIVES: Not in place.     HEALTH MAINTENANCE: Social History  Substance Use Topics  . Smoking status: Never Smoker  . Smokeless tobacco: Never Used  . Alcohol use No    Mammogram: 10/31/2015 Colonoscopy: 06/2009  Bone Density Scan: 01/05/2014 Pap Smear: June, 2015 Eye Exam: 01/2013 Vitamin D Level: previously normal  Lipid Panel: unknown   Allergies  Allergen Reactions  . Adhesive [Tape] Hives  . Morphine Hives    Current Outpatient Prescriptions  Medication Sig Dispense Refill  . ascorbic acid (VITAMIN C) 250 MG tablet Take 500 mg by mouth daily.    [name] BIOTIN PO Take 1 tablet by mouth daily.    . Calcium Carbonate-Vitamin D (CALCIUM + D) 600-200 MG-UNIT TABS Take 2 tablets by mouth daily.      . cetirizine (ZYRTEC) 10 MG tablet Take 10 mg by mouth daily.    . clobetasol ointment (TEMOVATE) 0.05 %   3  . diltiazem (CARDIZEM CD) 180 MG 24 hr capsule Take 1 capsule (180 mg total) by mouth daily. 30 capsule 11  . furosemide (LASIX) 40 MG tablet Take 1 tablet (40 mg total) by mouth daily. 30 tablet 11  . gabapentin (NEURONTIN) 600 MG tablet Take 600 mg by mouth at bedtime.    . lidocaine (LIDODERM) 5 %   5  . lisinopril (PRINIVIL,ZESTRIL) 5 MG tablet Take 1 tablet (5 mg total) by mouth daily. 30 tablet 11  . Multiple Vitamins-Minerals (MULTIVITAMIN WITH MINERALS) tablet Take 1 tablet by  mouth daily.      . nabumetone (RELAFEN) 500 MG tablet Take 500 mg by mouth 2 (two) times daily.      [name] NEXIUM 40 MG capsule TAKE ONE CAPSULE BY MOUTH EVERY DAY 30 capsule 0  . potassium chloride SA (K-DUR,KLOR-CON) 20 MEQ tablet Take 1 tablet (20 mEq total) by mouth every other day. 15 tablet 11  . scopolamine (TRANSDERM-SCOP) 1 MG/3DAYS Place 1 patch (1.5 mg total) onto the skin every 3 (three) days. 4 patch 0  . tapentadol (NUCYNTA) 50 MG TABS tablet Take 50 mg by mouth every 6 (six) hours as needed.    . triamcinolone cream (KENALOG) 0.1 %   1  . UNABLE TO FIND 1 tablet. i Cool supplement  Take 1 tablet daily for hot flashes    . vitamin E 400 UNIT capsule Take 400 Units by mouth daily.     No current facility-administered medications for this visit.     OBJECTIVE: There were no vitals filed for this visit.   There is no height or weight on file to calculate BMI.     GENERAL: Patient is a well appearing female in no acute distress HEENT:  Sclerae anicteric.  Oropharynx clear and moist. No ulcerations or evidence of oropharyngeal candidiasis. Neck is supple.  NODES:  No cervical, supraclavicular, or axillary lymphadenopathy palpated.  BREAST EXAM:  Right breast without nodules, masses, left breast without nodules or masses, benign bilateral breast exam.  LUNGS:  Clear to auscultation bilaterally.  No wheezes or rhonchi. HEART:  Regular rate and rhythm. No murmur appreciated. ABDOMEN:  Soft, nontender.  Positive, normoactive bowel sounds. No organomegaly palpated. MSK:  No focal spinal tenderness to palpation. Full range of motion bilaterally in the upper extremities. EXTREMITIES:  No peripheral edema.   SKIN:  Clear with no obvious rashes or skin changes, except a 2X3cm area of dark skin erythema in the upper outer quadrant of left breast, no skin ulcers or discharge. NEURO:  Nonfocal. Well oriented.  Appropriate affect.    ECOG FS:1 - Symptomatic but completely ambulatory    LAB  RESULTS: CBC Latest Ref Rng & Units 06/12/2016 12/16/2015 03/28/2015  WBC 3.9 - 10.3 10e3/uL 6.3 6.4 7.0  Hemoglobin 11.6 - 15.9 g/dL 12.9 13.7 13.5  Hematocrit 34.8 - 46.6 % 38.8 40.5 39.0  Platelets 145 - 400 10e3/uL 264 234 290    CMP Latest Ref Rng & Units 06/12/2016 12/16/2015 03/28/2015  Glucose 70 - 140 mg/dl 86 77 85  BUN 7.0 - 26.0 mg/dL 9.7 9.8 9  Creatinine 0.6 - 1.1 mg/dL 0.9 0.9 0.84  Sodium 136 - 145 mEq/L 142 141 140  Potassium 3.5 - 5.1 mEq/L 4.3 3.5 3.6  Chloride 98 - 110 mmol/L - - 104  CO2 22 - 29 mEq/L 27 29 27   Calcium 8.4 - 10.4 mg/dL 9.4 9.4 9.4  Total Protein 6.4 - 8.3 g/dL 7.6 7.6 6.9  Total Bilirubin 0.20 - 1.20 mg/dL 0.33 0.41 0.3  Alkaline Phos 40 - 150 U/L 83 68 73  AST 5 - 34 U/L 17 16 16   ALT 0 - 55 U/L 15 16 15      RADIOLOGY STUDIES: PET 05/06/2015 IMPRESSION: 1. Two areas of mild increased uptake within the right breast likely reflects the post biopsy changes. Correlation with routine breast cancer screening recommended. 2. No specific findings identified to suggest hypermetabolic metastasis. 3. Asymmetric increased uptake localizing to the superior articulating facet of the L2 vertebra is likely related to degenerative change. If there is a clinical concern for bone metastasis then this could be better assessed with lumbar spine MRI.  Bilateral diagnostic mammogram 10/31/2015  IMPRESSION: 1. No evidence of recurrent or new breast malignancy. 2. Benign postsurgical changes on the right. New  RECOMMENDATION: Screening mammogram in one year.(Code:[id number]-B-01Y)   ASSESSMENT: 51 y.o.  [place], [place] woman   1. Left breast skin rash -No skin itchiness, unlikely allergy reaction. The skin rash is not typical for skin fungal or bacteria infection. -No palpable mass, or skin ulcers. I reassured the patient that this is very unlikely skin or breast cancer, which is patient's main concern. I recommend her to use over-the-counter hydrocortisone  topical cream, twice a day, to see if the skin rash  improves or resolves -I do not think she needs skin biopsy or mammogram at this point. She knows to call me or her primary care physician if the rash gets worse.  2. History of right breast cancer, T2N0M0, stage IIA, grade III, invasive ductal carcinoma, ER negative, PR negative, Ki-67 77%, HER-2/neu negative. -She is now 7 years out of her initial diagnosis. Her physical exam and recent mammogram showed no evidence of recurrence. -she had a PET scan for her right-sided chest pain in October 2016, which was negative for disease recurrence. -We'll continue surveillance, with annual screening mammogram and a physical exam. She prefers to follow up with [place]. She is due for next mammogram in April 2018. -I encouraged her to have healthy diet and exercise.  3. 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 -Her recent echo showed EF 50-55%. -She is clinically doing well, we'll continue follow-up with cardiology.  Plan -I recommend her to use over-the-counter hydrocortisone cream to the skin rash in the left breast, and call us or see her PCP if the rash gets worse -Mammogram in April -She will see me back in November 2018 as it's scheduled  I spent 15 minutes counseling the patient face to face.  The total time spent in the appointment was 20 minutes.  Truitt Merle   08/13/2016

## 2016-08-13 NOTE — Telephone Encounter (Signed)
Patient called in requesting appointment due to rash she has delveloped on her breast - tough in texture. Patient transferred to desk nurse.

## 2016-08-13 NOTE — Telephone Encounter (Signed)
Spoke with pt and was informed that pt noticed a red rough patchy scale on Left side of nipple approx.  end of December.   Denied pain, denied discharge, denied bleeding.  Stated now the patchy area still has rough edges, pale color - might have very light pink appearance per pt.  Pt was very concerned. Dr. Burr Medico notified.  Spoke with pt and instructed pt to come in now today  08/13/16 to see Dr. Burr Medico.  Pt voiced understanding.

## 2016-08-14 ENCOUNTER — Ambulatory Visit: Payer: Medicare Other | Admitting: Hematology

## 2016-09-20 ENCOUNTER — Encounter: Payer: Self-pay | Admitting: Family Medicine

## 2016-09-20 ENCOUNTER — Ambulatory Visit (INDEPENDENT_AMBULATORY_CARE_PROVIDER_SITE_OTHER): Payer: PRIVATE HEALTH INSURANCE | Admitting: Family Medicine

## 2016-09-20 VITALS — BP 140/90 | HR 112 | Temp 99.1°F | Resp 16 | Wt 195.6 lb

## 2016-09-20 DIAGNOSIS — R509 Fever, unspecified: Secondary | ICD-10-CM

## 2016-09-20 DIAGNOSIS — J019 Acute sinusitis, unspecified: Secondary | ICD-10-CM | POA: Diagnosis not present

## 2016-09-20 LAB — POC INFLUENZA A&B (BINAX/QUICKVUE)
INFLUENZA A, POC: NEGATIVE
Influenza B, POC: NEGATIVE

## 2016-09-20 MED ORDER — AZELASTINE-FLUTICASONE 137-50 MCG/ACT NA SUSP: 1.0000 | Freq: Two times a day (BID) | NASAL | 0 refills | Status: DC

## 2016-09-20 MED ORDER — AMOXICILLIN 875 MG PO TABS: 875.0000 mg | ORAL_TABLET | Freq: Two times a day (BID) | ORAL | 0 refills | Status: DC

## 2016-09-20 NOTE — Patient Instructions (Signed)
Treat your symptoms with Tylenol or ibuprofen, antihistamine and decongestant. Drink extra water. You can use nasal saline spray.  REST. Let me know if you are not back to baseline after completing the antibiotic.   Sinusitis, Adult Sinusitis is soreness and inflammation of your sinuses. Sinuses are hollow spaces in the bones around your face. Your sinuses are located:  Around your eyes.  In the middle of your forehead.  Behind your nose.  In your cheekbones. Your sinuses and nasal passages are lined with a stringy fluid (mucus). Mucus normally drains out of your sinuses. When your nasal tissues become inflamed or swollen, the mucus can become trapped or blocked so air cannot flow through your sinuses. This allows bacteria, viruses, and funguses to grow, which leads to infection. Sinusitis can develop quickly and last for 7?10 days (acute) or for more than 12 weeks (chronic). Sinusitis often develops after a cold. What are the causes? This condition is caused by anything that creates swelling in the sinuses or stops mucus from draining, including:  Allergies.  Asthma.  Bacterial or viral infection.  Abnormally shaped bones between the nasal passages.  Nasal growths that contain mucus (nasal polyps).  Narrow sinus openings.  Pollutants, such as chemicals or irritants in the air.  A foreign object stuck in the nose.  A fungal infection. This is rare. What increases the risk? The following factors may make you more likely to develop this condition:  Having allergies or asthma.  Having had a recent cold or respiratory tract infection.  Having structural deformities or blockages in your nose or sinuses.  Having a weak immune system.  Doing a lot of swimming or diving.  Overusing nasal sprays.  Smoking. What are the signs or symptoms? The main symptoms of this condition are pain and a feeling of pressure around the affected sinuses. Other symptoms include:  Upper  toothache.  Earache.  Headache.  Bad breath.  Decreased sense of smell and taste.  A cough that may get worse at night.  Fatigue.  Fever.  Thick drainage from your nose. The drainage is often green and it may contain pus (purulent).  Stuffy nose or congestion.  Postnasal drip. This is when extra mucus collects in the throat or back of the nose.  Swelling and warmth over the affected sinuses.  Sore throat.  Sensitivity to light. How is this diagnosed? This condition is diagnosed based on symptoms, a medical history, and a physical exam. To find out if your condition is acute or chronic, your health care provider may:  Look in your nose for signs of nasal polyps.  Tap over the affected sinus to check for signs of infection.  View the inside of your sinuses using an imaging device that has a light attached (endoscope). If your health care provider suspects that you have chronic sinusitis, you may also:  Be tested for allergies.  Have a sample of mucus taken from your nose (nasal culture) and checked for bacteria.  Have a mucus sample examined to see if your sinusitis is related to an allergy. If your sinusitis does not respond to treatment and it lasts longer than 8 weeks, you may have an MRI or CT scan to check your sinuses. These scans also help to determine how severe your infection is. In rare cases, a bone biopsy may be done to rule out more serious types of fungal sinus disease. How is this treated? Treatment for sinusitis depends on the cause and whether your condition  is chronic or acute. If a virus is causing your sinusitis, your symptoms will go away on their own within 10 days. You may be given medicines to relieve your symptoms, including:  Topical nasal decongestants. They shrink swollen nasal passages and let mucus drain from your sinuses.  Antihistamines. These drugs block inflammation that is triggered by allergies. This can help to ease swelling in your  nose and sinuses.  Topical nasal corticosteroids. These are nasal sprays that ease inflammation and swelling in your nose and sinuses.  Nasal saline washes. These rinses can help to get rid of thick mucus in your nose. If your condition is caused by bacteria, you will be given an antibiotic medicine. If your condition is caused by a fungus, you will be given an antifungal medicine. Surgery may be needed to correct underlying conditions, such as narrow nasal passages. Surgery may also be needed to remove polyps. Follow these instructions at home: Medicines  Take, use, or apply over-the-counter and prescription medicines only as told by your health care provider. These may include nasal sprays.  If you were prescribed an antibiotic medicine, take it as told by your health care provider. Do not stop taking the antibiotic even if you start to feel better. Hydrate and Humidify  Drink enough water to keep your urine clear or pale yellow. Staying hydrated will help to thin your mucus.  Use a cool mist humidifier to keep the humidity level in your home above 50%.  Inhale steam for 10-15 minutes, 3-4 times a day or as told by your health care provider. You can do this in the bathroom while a hot shower is running.  Limit your exposure to cool or dry air. Rest  Rest as much as possible.  Sleep with your head raised (elevated).  Make sure to get enough sleep each night. General instructions  Apply a warm, moist washcloth to your face 3-4 times a day or as told by your health care provider. This will help with discomfort.  Wash your hands often with soap and water to reduce your exposure to viruses and other germs. If soap and water are not available, use hand sanitizer.  Do not smoke. Avoid being around people who are smoking (secondhand smoke).  Keep all follow-up visits as told by your health care provider. This is important. Contact a health care provider if:  You have a  fever.  Your symptoms get worse.  Your symptoms do not improve within 10 days. Get help right away if:  You have a severe headache.  You have persistent vomiting.  You have pain or swelling around your face or eyes.  You have vision problems.  You develop confusion.  Your neck is stiff.  You have trouble breathing. This information is not intended to replace advice given to you by your health care provider. Make sure you discuss any questions you have with your health care provider. Document Released: 07/16/2005 Document Revised: 03/11/2016 Document Reviewed: 05/11/2015 Elsevier Interactive Patient Education  2017 [employer].

## 2016-09-20 NOTE — Progress Notes (Signed)
Subjective: Chief Complaint  Patient presents with  . URI    URI- 2 days ago, symptoms- mucous running out of nose, headache, tongue thick, stopped up nose     Olivia [name] is a 51 y.o. female who presents for a 24 hour history of rhinorrhea, nasal congestion, right sided sinus pressure, fever, chills, and frontal headache. Also reports having post nasal drainage and feeling like her tongue is swollen.  Denies eating any new foods, no new medications.   Denies body aches, cough, shortness of breath, chest pain, palpitations, abdominal pain, N/V/D, LE edema. No difficulty swallowing or breathing.   Treatment to date: Advil cold and sinus.  Denies sick contacts.  No other aggravating or relieving factors.  No other c/o.  ROS as in subjective.   Objective: Vitals:   09/20/16 1125  BP: 140/90  Pulse: (!) 112  Resp: 16  Temp: 99.1 F (37.3 C)    General appearance: Alert, WD/WN, no distress, mildly ill appearing                             Skin: warm, no rash                           Head: marked frontal and mild maxillary sinus tenderness                            Eyes: conjunctiva normal, corneas clear, PERRLA                            Ears: pearly TMs, external ear canals normal                          Nose: septum midline, turbinates swollen, with erythema and clear discharge             Mouth/throat: MMM, tongue normal, mild pharyngeal erythema without edema                           Neck: supple, no adenopathy, no thyromegaly, left anterior cervical tenderness                          Heart: slightly tachycardiac, regular rhythm, normal S1, S2, no murmurs                         Lungs: CTA bilaterally, no wheezes, rales, or rhonchi      Assessment: Fever, unspecified fever cause - Plan: POC Influenza A&B(BINAX/QUICKVUE)  Acute sinusitis, recurrence not specified, unspecified location   Plan: Negative flu swab.  Discussed diagnosis and treatment of acute sinusitis  and URI. Amoxicillin and Dymista sent to pharmacy.  Suggested symptomatic OTC remedies. Norel AD #10 given. Nasal saline spray for congestion.  Tylenol or Ibuprofen OTC for fever and malaise.  Call/return if not back to baseline after completing the antibiotic or sooner if symptoms worsen.

## 2016-10-25 ENCOUNTER — Other Ambulatory Visit: Payer: Self-pay | Admitting: Obstetrics and Gynecology

## 2016-10-25 DIAGNOSIS — Z1231 Encounter for screening mammogram for malignant neoplasm of breast: Secondary | ICD-10-CM

## 2016-11-05 DIAGNOSIS — I10 Essential (primary) hypertension: Secondary | ICD-10-CM | POA: Diagnosis not present

## 2016-11-05 DIAGNOSIS — Z6825 Body mass index (BMI) 25.0-25.9, adult: Secondary | ICD-10-CM | POA: Diagnosis not present

## 2016-11-13 ENCOUNTER — Ambulatory Visit
Admission: RE | Admit: 2016-11-13 | Discharge: 2016-11-13 | Disposition: A | Discharge status: Home / Self Care | Payer: PRIVATE HEALTH INSURANCE | Source: Ambulatory Visit | Attending: Obstetrics and Gynecology | Admitting: Obstetrics and Gynecology

## 2016-11-13 ENCOUNTER — Encounter: Payer: Self-pay | Admitting: Radiology

## 2016-11-13 DIAGNOSIS — Z1231 Encounter for screening mammogram for malignant neoplasm of breast: Secondary | ICD-10-CM

## 2016-11-13 HISTORY — DX: Personal history of antineoplastic chemotherapy: Z92.21

## 2016-11-13 HISTORY — DX: Personal history of irradiation: Z92.3

## 2017-02-05 ENCOUNTER — Telehealth: Payer: Self-pay | Admitting: Internal Medicine

## 2017-02-05 NOTE — Telephone Encounter (Signed)
Pt is due for CPE. Left message for pt to call back and schedule CPE appt

## 2017-04-09 ENCOUNTER — Ambulatory Visit (INDEPENDENT_AMBULATORY_CARE_PROVIDER_SITE_OTHER): Payer: PRIVATE HEALTH INSURANCE | Admitting: Sports Medicine

## 2017-04-09 ENCOUNTER — Other Ambulatory Visit: Payer: Self-pay | Admitting: Sports Medicine

## 2017-04-09 ENCOUNTER — Encounter: Payer: Self-pay | Admitting: Sports Medicine

## 2017-04-09 ENCOUNTER — Ambulatory Visit (INDEPENDENT_AMBULATORY_CARE_PROVIDER_SITE_OTHER): Payer: PRIVATE HEALTH INSURANCE

## 2017-04-09 VITALS — BP 118/76 | HR 72

## 2017-04-09 DIAGNOSIS — M7751 Other enthesopathy of right foot: Secondary | ICD-10-CM

## 2017-04-09 DIAGNOSIS — M19071 Primary osteoarthritis, right ankle and foot: Secondary | ICD-10-CM | POA: Diagnosis not present

## 2017-04-09 DIAGNOSIS — M779 Enthesopathy, unspecified: Secondary | ICD-10-CM

## 2017-04-09 DIAGNOSIS — M25571 Pain in right ankle and joints of right foot: Secondary | ICD-10-CM | POA: Diagnosis not present

## 2017-04-09 DIAGNOSIS — M79671 Pain in right foot: Secondary | ICD-10-CM

## 2017-04-09 DIAGNOSIS — G8929 Other chronic pain: Secondary | ICD-10-CM | POA: Diagnosis not present

## 2017-04-09 MED ORDER — DICLOFENAC SODIUM 75 MG PO TBEC: 75.0000 mg | DELAYED_RELEASE_TABLET | Freq: Two times a day (BID) | ORAL | 0 refills | Status: DC

## 2017-04-09 NOTE — Progress Notes (Signed)
Subjective:  Olivia [name] is a 51 y.o. female patient who presents to office for evaluation of Right ankle pain. Patient complains of continued pain in the ankle x 2 years off an on. States that its hard to describe. Sometimes throbs and aches. Walking initially when it flares up hurt, last flare was over 1 month ago and it lasted for 3-4 days. States that she also sometimes experiences swelling from time to time in ankles on lasix. States right now there is no pain in the ankle on right.  Patient has tried nambutone with no relief in symptoms. States rest and heat/ice helps. Patient denies any other pedal complaints. Admits to back issues. Denies recent injury/trip/fall/sprain/any causative factors.   ROS per nurse note  Patient Active Problem List   Diagnosis Date Noted  . Mass of chest wall, left 03/28/2015  . Breast lump in female 03/28/2015  . Personal history of malignant neoplasm of breast 03/28/2015  . Hypertensive heart disease - diastolic dysfunction noted on echo 10/16/2014  . SOB (shortness of breath) on exertion 10/16/2013  . Palpitations 10/16/2013  . Right atrial thrombus 10/16/2013  . Essential hypertension, benign 06/17/2013  . Abnormal echocardiogram 06/17/2013  . GERD (gastroesophageal reflux disease) 06/11/2013  . Generalized anxiety disorder 06/11/2013  . History of right breast cancer 11/07/2011  . Hyperlipidemia 08/23/2011  . Shortness of breath 05/13/2009    Current Outpatient Prescriptions on File Prior to Visit  Medication Sig Dispense Refill  . ascorbic acid (VITAMIN C) 250 MG tablet Take 500 mg by mouth daily.    [name] BIOTIN PO Take 1 tablet by mouth daily.    . Calcium Carbonate-Vitamin D (CALCIUM + D) 600-200 MG-UNIT TABS Take 2 tablets by mouth daily.      . cetirizine (ZYRTEC) 10 MG tablet Take 10 mg by mouth daily.    [name] diltiazem (CARDIZEM CD) 180 MG 24 hr capsule Take 1 capsule (180 mg total) by mouth daily. 30 capsule 11  . furosemide (LASIX) 40 MG  tablet Take 1 tablet (40 mg total) by mouth daily. 30 tablet 11  . gabapentin (NEURONTIN) 600 MG tablet Take 600 mg by mouth at bedtime.    . lidocaine (LIDODERM) 5 %   5  . lisinopril (PRINIVIL,ZESTRIL) 5 MG tablet Take 1 tablet (5 mg total) by mouth daily. 30 tablet 11  . Multiple Vitamins-Minerals (MULTIVITAMIN WITH MINERALS) tablet Take 1 tablet by mouth daily.      . nabumetone (RELAFEN) 500 MG tablet Take 500 mg by mouth 2 (two) times daily.      [name] NEXIUM 40 MG capsule TAKE ONE CAPSULE BY MOUTH EVERY DAY 30 capsule 0  . potassium chloride SA (K-DUR,KLOR-CON) 20 MEQ tablet Take 1 tablet (20 mEq total) by mouth every other day. 15 tablet 11  . tapentadol (NUCYNTA) 50 MG TABS tablet Take 50 mg by mouth every 6 (six) hours as needed.    [name] UNABLE TO FIND 1 tablet. i Cool supplement  Take 1 tablet daily for hot flashes    . vitamin E 400 UNIT capsule Take 400 Units by mouth daily.     No current facility-administered medications on file prior to visit.     Allergies  Allergen Reactions  . Adhesive [Tape] Hives  . Morphine Hives    Objective:  General: Alert and oriented x3 in no acute distress  Dermatology: No open lesions bilateral lower extremities, no webspace macerations, no ecchymosis bilateral, all nails x 10 are well manicured.  Vascular: Dorsalis Pedis and Posterior Tibial pedal pulses palpable, Capillary Fill Time 3 seconds,(+) pedal hair growth bilateral, trace edema bilateral lower extremities, Temperature gradient within normal limits.  Neurology: Gross sensation intact via light touch bilateral. (- )Tinels sign bilateral.   Musculoskeletal:No reproducible tenderness to right ankle. Subjective pain at anterior ankle on right when flares. Negative talar tilt, Negative tib-fib stress, No instability. No pain with calf compression bilateral. Range of motion within normal limits. Pes planus. Strength within normal limits in all groups bilateral.   Gait: Antalgic  gait  Xrays  Right foot/ankle   Impression: Normal osseous mineralization, joint space narrowing at lateral gutter and midfoot, midtarsal breech, no other acute findings.   Assessment and Plan: Problem List Items Addressed This Visit    None    Visit Diagnoses    Capsulitis of ankle, right    -  Primary   Relevant Medications   diclofenac (VOLTAREN) 75 MG EC tablet   Arthritis of ankle, right       Relevant Medications   diclofenac (VOLTAREN) 75 MG EC tablet   Chronic pain of right ankle       Relevant Orders   DG Foot Complete Right (Completed)       -Complete examination performed -Xrays reviewed -Discussed treatement options for arthritis at ankle with secondary pes planus -Rx Diclofenac to replace nambutone -Advised patient on need for custom foot orthotics office will contact patient to discuss benefits if not covered recommend superfeet -May use OTC topical pain creams/rubs and ice/heat/soaking as needed -Patient to return to office as needed or sooner if condition worsens.  Landis Martins, DPM

## 2017-04-09 NOTE — Progress Notes (Signed)
   Subjective:    Patient ID: Olivia [name], female    DOB: 13-Jul-1966, 51 y.o.   MRN: [id number]  HPI  Chief Complaint  Patient presents with  . Foot Pain    Rt  . Ankle Injury    Rt       Review of Systems  Constitutional: Positive for diaphoresis.  HENT: Positive for congestion.   Eyes: Positive for pain and redness.  Cardiovascular: Positive for leg swelling.  Musculoskeletal: Positive for back pain, gait problem and myalgias.  Neurological: Positive for weakness and numbness.  All other systems reviewed and are negative.      Objective:   Physical Exam        Assessment & Plan:

## 2017-04-26 ENCOUNTER — Telehealth: Payer: Self-pay | Admitting: Sports Medicine

## 2017-04-26 NOTE — Telephone Encounter (Signed)
Left message for pt to call me back reguarding orthotics.I have verifed benefits.

## 2017-05-01 ENCOUNTER — Other Ambulatory Visit: Payer: Self-pay | Admitting: Physician Assistant

## 2017-05-02 ENCOUNTER — Other Ambulatory Visit: Payer: Self-pay | Admitting: Cardiology

## 2017-05-09 ENCOUNTER — Telehealth: Payer: Self-pay | Admitting: *Deleted

## 2017-05-09 MED ORDER — DICLOFENAC SODIUM 75 MG PO TBEC: 75.0000 mg | DELAYED_RELEASE_TABLET | Freq: Two times a day (BID) | ORAL | 0 refills | Status: DC

## 2017-05-09 NOTE — Telephone Encounter (Signed)
Refill request for diclofenac. Dr. Cannon Kettle ordered refill as previously, pt needs an appt prior to future refills.

## 2017-05-13 ENCOUNTER — Other Ambulatory Visit: Payer: Self-pay | Admitting: Cardiology

## 2017-06-05 ENCOUNTER — Encounter: Payer: Self-pay | Admitting: Cardiology

## 2017-06-05 ENCOUNTER — Ambulatory Visit (INDEPENDENT_AMBULATORY_CARE_PROVIDER_SITE_OTHER): Payer: PRIVATE HEALTH INSURANCE | Admitting: Cardiology

## 2017-06-05 VITALS — BP 128/90 | HR 78 | Ht 65.0 in | Wt 188.4 lb

## 2017-06-05 DIAGNOSIS — I5030 Unspecified diastolic (congestive) heart failure: Secondary | ICD-10-CM | POA: Diagnosis not present

## 2017-06-05 DIAGNOSIS — R002 Palpitations: Secondary | ICD-10-CM

## 2017-06-05 DIAGNOSIS — M940 Chondrocostal junction syndrome [Tietze]: Secondary | ICD-10-CM | POA: Diagnosis not present

## 2017-06-05 DIAGNOSIS — E782 Mixed hyperlipidemia: Secondary | ICD-10-CM

## 2017-06-05 DIAGNOSIS — I110 Hypertensive heart disease with heart failure: Secondary | ICD-10-CM | POA: Diagnosis not present

## 2017-06-05 MED ORDER — PREDNISONE 10 MG (21) PO TBPK: ORAL_TABLET | ORAL | 0 refills | Status: AC

## 2017-06-05 NOTE — Progress Notes (Signed)
PCP: Rita Ohara, MD  Clinic Note: Chief Complaint  Patient presents with  . Chest Pain    pt states having some bad chest pains a couple weeks ago. States it was a really sharp pain but it didn't last long     HPI: Olivia [name] is a 51 y.o. female with a PMH below who presents today for annual follow-up for right atrial thrombus, hypertensive heart disease and palpitations.  She was noted to have a right atrial thrombus back in 2014 thought to be related to a PICC line.Olivia [name] was last seen on May 08, 2016 by Rosaria Ferries, PA for shortness of breath.  I had just seen her 1 month prior to that.  I have decreased her diltiazem down the 120 mg, and provided as needed Lasix.  She noted more palpitations with decreased diltiazem and increased shortness of breath.  No chest pain. -->  Increased diltiazem back to previous dose.  Recent Hospitalizations: None  Studies Personally Reviewed - (if available, images/films reviewed: From Epic Chart or Care Everywhere)  None since March 2016  Interval History: Olivia [name] presents today overall doing okay from a cardiac standpoint.  But every now and then she is been having this chest discomfort along the left side of her chest and has had a general feeling of just tiredness and fatigue in the chest.  She does not describe it as being pressure or tightness.  Is not associated with exertion.  For the most part she is otherwise doing fine.  Her palpitations are well controlled.  She has not had real exertional chest tightness or pressure or even dyspnea. She is taking her Lasix every other day as opposed to daily and is doing fine with no significant PND, orthopnea or edema.  No lightheadedness, dizziness, weakness or syncope/near syncope. No TIA/amaurosis fugax symptoms.  No claudication.  ROS: A comprehensive was performed. Review of Systems  Constitutional: Positive for malaise/fatigue (Starting to feel a little bit tired -a tired  sensation in her chest of late.). Negative for weight loss.  HENT: Negative for congestion and nosebleeds.   Respiratory: Negative for cough, shortness of breath and wheezing.   Cardiovascular: Positive for chest pain (Left-sided chest discomfort at rest).  Gastrointestinal: Negative for abdominal pain, blood in stool, heartburn and melena.  Genitourinary: Negative for flank pain and hematuria.  Musculoskeletal: Negative for falls and joint pain.  Neurological: Negative for dizziness and weakness.  Psychiatric/Behavioral: The patient is not nervous/anxious.   All other systems reviewed and are negative.   I have reviewed and (if needed) personally updated the patient's problem list, medications, allergies, past medical and surgical history, social and family history.   Past Medical History:  Diagnosis Date  . Breast cancer, stage 1 ([place])   . Colon polyp   . GERD (gastroesophageal reflux disease)   . H/O diastolic dysfunction 56/4332   Grade 2 DD. 1.3 x 1.3 cm mass in RA on Echo -->Cardiac MRI 12/04: 1.9 x 1.6 cm mass in the lateral RA cavity. -- Most suggestive of thrombus--  calcified and  lamina and ted suggestive of chronic.; EF from 50% with no scar.  [name] History of breast cancer 2010   invasive ductal R breast; s/p lumpectomy, radiation and chemo  . Lumbar degenerative disc disease   . Obesity   . Personal history of chemotherapy   . Personal history of radiation therapy   . Right atrial thrombus 05/2013   f/u Echo 09/2014:  Low normal LV function; grade 2 diastolic dysfunction; calcified right atrial mass; no change compared to 06/11/13.  [name] Uterine fibroid    s/p embolization    Past Surgical History:  Procedure Laterality Date  . BREAST BIOPSY Right 12/31/2012  . BREAST BIOPSY Right 09/06/2008  . BREAST BIOPSY Right 08/11/2008  . BREAST LUMPECTOMY  2010   right  . Cardiac MRI  December 2014   1.9 x 1.6 cm mass in the lateral RA cavity. -- Most suggestive of thrombus--   calcified anndd  laammiinnaated suggestive of chronic.; EF from 50-4% with no scar. Trivial effusion  . COLONOSCOPY  2010  . LUMBAR LAMINECTOMY/DECOMPRESSION MICRODISCECTOMY  2007, 2008   Dr. Hal Neer x 2  . POWER PORT    . TRANSTHORACIC ECHOCARDIOGRAM  November 2014; March 2016t   a) Mild LVH, EF of roughly 50%. Pseudo-normal LV filling (grade 3 diastolic soft. Mild LA dilation. 1.3 x 1.3 cm mass in right atrium.;; b) Low normal LV function; grade 2 diastolic dysfunction; calcified right atrial mass; no change compared to 06/11/13.  . TUBAL LIGATION  2010  . UTERINE FIBROID EMBOLIZATION  2008 or 2009    Current Meds  Medication Sig  . ascorbic acid (VITAMIN C) 250 MG tablet Take 500 mg by mouth daily.  [name] BIOTIN PO Take 1 tablet by mouth daily.  . Calcium Carbonate-Vitamin D (CALCIUM + D) 600-200 MG-UNIT TABS Take 2 tablets by mouth daily.    . cetirizine (ZYRTEC) 10 MG tablet Take 10 mg by mouth daily.  . diclofenac (VOLTAREN) 75 MG EC tablet Take 1 tablet (75 mg total) by mouth 2 (two) times daily.  [name] diltiazem (CARDIZEM CD) 180 MG 24 hr capsule TAKE 1 CAPSULE (180 MG TOTAL) BY MOUTH DAILY.  . furosemide (LASIX) 40 MG tablet TAKE 1 TABLET (40 MG TOTAL) BY MOUTH DAILY.  [name] gabapentin (NEURONTIN) 600 MG tablet Take 600 mg by mouth at bedtime.  [name] KLOR-CON M20 20 MEQ tablet TAKE 1 TABLET (20 MEQ TOTAL) BY MOUTH EVERY OTHER DAY.  [name] lidocaine (LIDODERM) 5 %   . lisinopril (PRINIVIL,ZESTRIL) 5 MG tablet TAKE 1 TABLET (5 MG TOTAL) BY MOUTH DAILY.  . Multiple Vitamins-Minerals (MULTIVITAMIN WITH MINERALS) tablet Take 1 tablet by mouth daily.    . nabumetone (RELAFEN) 500 MG tablet Take 500 mg by mouth 2 (two) times daily.    [name] NEXIUM 40 MG capsule TAKE ONE CAPSULE BY MOUTH EVERY DAY  . tapentadol (NUCYNTA) 50 MG TABS tablet Take 50 mg by mouth every 6 (six) hours as needed.  [name] UNABLE TO FIND 1 tablet. i Cool supplement  Take 1 tablet daily for hot flashes  . vitamin E 400 UNIT capsule Take 400  Units by mouth daily.    Allergies  Allergen Reactions  . Adhesive [Tape] Hives  . Morphine Hives    Social History   Socioeconomic History  . Marital status: Married    Spouse name: None  . Number of children: None  . Years of education: None  . Highest education level: None  Social Needs  . Financial resource strain: None  . Food insecurity - worry: None  . Food insecurity - inability: None  . Transportation needs - medical: None  . Transportation needs - non-medical: None  Occupational History  . None  Tobacco Use  . Smoking status: Never Smoker  . Smokeless tobacco: Never Used  Substance and Sexual Activity  . Alcohol use: No  . Drug use: No  .  Sexual activity: Yes  Other Topics Concern  . None  Social History Narrative   She is married with one child age 32 (March 2013). She's been married for 9-1/2 years to   She walks with a cane sometimes. She is currently disabled.   Never smoked.   He does not exercise regularly.    family history includes Breast cancer in her mother; Cancer in her maternal grandmother.  Wt Readings from Last 3 Encounters:  06/05/17 188 lb 6.4 oz (85.5 kg)  09/20/16 195 lb 9.6 oz (88.7 kg)  06/12/16 196 lb 12.8 oz (89.3 kg)    PHYSICAL EXAM BP 128/90   Pulse 78   Ht 5\' 5"  (1.651 m)   Wt 188 lb 6.4 oz (85.5 kg)   BMI 31.35 kg/m  Physical Exam  Constitutional: She is oriented to person, place, and time. She appears well-developed and well-nourished. No distress.  Mildly obese  HENT:  Head: Normocephalic and atraumatic.  Eyes: EOM are normal. No scleral icterus.  Neck: No hepatojugular reflux and no JVD present. Carotid bruit is not present.  Cardiovascular: Normal rate, regular rhythm and intact distal pulses. PMI is not displaced. Exam reveals gallop and S4 (soft).  Murmur (soft SEM) heard. Pulmonary/Chest: Effort normal and breath sounds normal. No respiratory distress. She has no wheezes. She has no rales.  She has 2 areas  of focal tenderness along the left costochondral margin -probably more related to the third and fourth ribs.  Abdominal: Soft. Bowel sounds are normal. She exhibits no distension. There is no tenderness. There is no rebound.  Musculoskeletal: Normal range of motion. She exhibits edema (Trivial).  Neurological: She is alert and oriented to person, place, and time.  Skin: Skin is warm and dry. No erythema.  Psychiatric: She has a normal mood and affect. Her behavior is normal. Judgment and thought content normal.  Nursing note and vitals reviewed.    Adult ECG Report  Rate: 78;  Rhythm: normal sinus rhythm and Nonspecific ST and T wave changes.; otherwise normal axis, intervals and durations  Narrative Interpretation: Stable EKG   Other studies Reviewed: Additional studies/ records that were reviewed today include:  Recent Labs:   Lab Results  Component Value Date   CREATININE 0.9 06/12/2016   BUN 9.7 06/12/2016   NA 142 06/12/2016   K 4.3 06/12/2016   CL 104 03/28/2015   CO2 27 06/12/2016    ASSESSMENT / PLAN: Problem List Items Addressed This Visit    Costochondritis, acute - Primary    This unusual sensation she is chest is associated with point tenderness along the chest wall.  I think that is probably more consistent with costochondritis.  Will do a short steroid taper to see how this does.  I will see her back in a month or so to see how she is doing.      Relevant Orders   EKG 12-Lead   Hyperlipidemia (Chronic)    Labs should be followed by PCP.  They are very poorly controlled several years ago.  She is not on a statin, would recommend close control of PCP.      Hypertensive heart disease - diastolic dysfunction noted on echo (Chronic)    No acute symptoms noted.  No signs of heart failure.  She just has a little bit of fatigue noted.  Her blood pressure is pretty well controlled with the diltiazem and ACE inhibitor..  She is taking her Lasix every other day.  Palpitations (Chronic)    Well-controlled on diltiazem.  She did not do well with the lower dose of diltiazem, so therefore we will add back up to 180 mg.         Current medicines are reviewed at length with the patient today. (+/- concerns) n/a The following changes have been made: n/a  Patient Instructions  MEDICATION  INSTRUCTIONS Prednisone Taper follow instructions      Your physician recommends that you schedule a follow-up appointment in 1 month with DR Jaycub Noorani OR EXTENDER       Studies Ordered:   Orders Placed This Encounter  Procedures  . EKG 12-Lead      Glenetta Hew, M.D., M.S. Interventional Cardiologist   Pager # [phone] Phone # [phone] [address] [place], [place] [address]

## 2017-06-05 NOTE — Assessment & Plan Note (Signed)
Labs should be followed by PCP.  They are very poorly controlled several years ago.  She is not on a statin, would recommend close control of PCP.

## 2017-06-05 NOTE — Assessment & Plan Note (Signed)
No acute symptoms noted.  No signs of heart failure.  She just has a little bit of fatigue noted.  Her blood pressure is pretty well controlled with the diltiazem and ACE inhibitor..  She is taking her Lasix every other day.

## 2017-06-05 NOTE — Assessment & Plan Note (Signed)
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  Related to catheter.  Safely off Eliquis at this time.

## 2017-06-05 NOTE — Assessment & Plan Note (Signed)
This unusual sensation she is chest is associated with point tenderness along the chest wall.  I think that is probably more consistent with costochondritis.  Will do a short steroid taper to see how this does.  I will see her back in a month or so to see how she is doing.

## 2017-06-05 NOTE — Assessment & Plan Note (Signed)
Well-controlled on diltiazem.  She did not do well with the lower dose of diltiazem, so therefore we will add back up to 180 mg.

## 2017-06-05 NOTE — Patient Instructions (Addendum)
MEDICATION  INSTRUCTIONS Prednisone Taper follow instructions      Your physician recommends that you schedule a follow-up appointment in 1 month with DR HARDING OR EXTENDER

## 2017-06-10 ENCOUNTER — Other Ambulatory Visit: Payer: Self-pay | Admitting: *Deleted

## 2017-06-10 DIAGNOSIS — E785 Hyperlipidemia, unspecified: Secondary | ICD-10-CM

## 2017-06-11 ENCOUNTER — Other Ambulatory Visit: Payer: Medicare Other

## 2017-06-11 ENCOUNTER — Ambulatory Visit: Payer: Medicare Other | Admitting: Nurse Practitioner

## 2017-06-14 ENCOUNTER — Encounter: Payer: Self-pay | Admitting: Medical

## 2017-06-14 ENCOUNTER — Ambulatory Visit (INDEPENDENT_AMBULATORY_CARE_PROVIDER_SITE_OTHER): Payer: PRIVATE HEALTH INSURANCE | Admitting: Medical

## 2017-06-14 VITALS — BP 122/84 | HR 96 | Temp 98.1°F | Wt 189.4 lb

## 2017-06-14 DIAGNOSIS — J0110 Acute frontal sinusitis, unspecified: Secondary | ICD-10-CM

## 2017-06-14 MED ORDER — FLUTICASONE PROPIONATE 50 MCG/ACT NA SUSP: 2.0000 | Freq: Every day | NASAL | 0 refills | Status: DC

## 2017-06-14 MED ORDER — PREDNISONE 20 MG PO TABS: ORAL_TABLET | ORAL | 0 refills | Status: DC

## 2017-06-14 MED ORDER — AMOXICILLIN 875 MG PO TABS: 875.0000 mg | ORAL_TABLET | Freq: Two times a day (BID) | ORAL | 0 refills | Status: DC

## 2017-06-14 MED ORDER — OXYMETAZOLINE HCL 0.05 % NA SOLN: 1.0000 | Freq: Two times a day (BID) | NASAL | 0 refills | Status: DC

## 2017-06-14 NOTE — Patient Instructions (Addendum)
Recommendations:  Drink plenty of water to help flush out mucous  You can continue the Mucinex D this week as long as its not raising your blood pressure  Add nasal saline to flush out the sinuses and nasal passages  Consider adding over the counter Flonase nasal spray to help reduce inflammation in the nose.  You can use this Flonase 1-2 sprays twice daily  For worse stopped up nose, use short term Afrin nasal spray over the counter for 4 days or less  Begin Amoxicillin antibiotic  In the future, consider Mucinex plain or Mucinex DM or Coricidin HBP  In general avoid "D" in cough/congestion medication as the D for sudafed raises blood pressure     Using Saline Nose Drops with Bulb Syringe A bulb syringe is used to clear your nose. You may use it when you have a stuffy nose, nasal congestion, sinus pressure, or sneezing.   SALINE SOLUTION You can buy nose drops at your local drug store. You can also make nose drops yourself. Mix 1 cup of water with  teaspoon of salt. Stir. Store this mixture at room temperature. Make a new batch daily.  USE THE BULB IN COMBINATION WITH SALINE NOSE DROPS  Squeeze the air out of the bulb before suctioning the saline mixture.  While still squeezing the bulb flat, place the tip of the bulb into the saline mixture.  Let air come back into the bulb.  This will suction up the saline mixture.  Gently flush one nostril at a time.  Salt water nose drops will then moisten your  congested nose and loosen secretions before suctioning.  Use the bulb syringe as directed below to suction.  USING THE BULB SYRINGE TO SUCTION  While still squeezing the bulb flat, place the tip of the bulb into a nostril. Let air come back into the bulb. The suction will pull snot out of the nose and into the bulb.  Repeat on the other nostril.  Squeeze syringe several times into a tissue.  CLEANING THE BULB SYRINGE  Clean the bulb syringe every day with hot soapy  water.  Clean the inside of the bulb by squeezing the bulb while the tip is in soapy water.  Rinse by squeezing the bulb while the tip is in clean hot water.  Store the bulb with the tip side down on paper towel.  HOME CARE INSTRUCTIONS   Use saline nose drops often to keep the nose open and not stuffy.  Throw away used salt water. Make a new solution every time.  Do not use the same solution and dropper for another person  If you do not prefer to use nasal saline flush, other options include nasal saline spray or the [employer], both of which are available over the counter at your pharmacy.

## 2017-06-14 NOTE — Progress Notes (Signed)
Subjective:  Olivia [name] is a 51 y.o. female who presents for possible sinus infection.  Here with husband.  She reports about a week hx/o headache, sore throat, cough, sneezing, watery eyes, congestion, can't breath through nose.  Has felt fever, chills.   Taking tylenol.   Taking Mucinex D  denies sick contacts.  Patient is not a smoker.  No other aggravating or relieving factors.  No other c/o.  Past Medical History:  Diagnosis Date  . Breast cancer, stage 1 ([place])   . Colon polyp   . GERD (gastroesophageal reflux disease)   . H/O diastolic dysfunction 44/0102   Grade 2 DD. 1.3 x 1.3 cm mass in RA on Echo -->Cardiac MRI 12/04: 1.9 x 1.6 cm mass in the lateral RA cavity. -- Most suggestive of thrombus--  calcified and  lamina and ted suggestive of chronic.; EF from 50% with no scar.  [name] History of breast cancer 2010   invasive ductal R breast; s/p lumpectomy, radiation and chemo  . Lumbar degenerative disc disease   . Obesity   . Personal history of chemotherapy   . Personal history of radiation therapy   . 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  [name] Uterine fibroid    s/p embolization   Current Outpatient Medications on File Prior to Visit  Medication Sig Dispense Refill  . ascorbic acid (VITAMIN C) 250 MG tablet Take 500 mg by mouth daily.    . Calcium Carbonate-Vitamin D (CALCIUM + D) 600-200 MG-UNIT TABS Take 2 tablets by mouth daily.      . cetirizine (ZYRTEC) 10 MG tablet Take 10 mg by mouth daily.    . diclofenac (VOLTAREN) 75 MG EC tablet Take 1 tablet (75 mg total) by mouth 2 (two) times daily. 60 tablet 0  . diltiazem (CARDIZEM CD) 180 MG 24 hr capsule TAKE 1 CAPSULE (180 MG TOTAL) BY MOUTH DAILY. 30 capsule 1  . furosemide (LASIX) 40 MG tablet TAKE 1 TABLET (40 MG TOTAL) BY MOUTH DAILY. 30 tablet 1  . gabapentin (NEURONTIN) 600 MG tablet Take 600 mg  by mouth at bedtime.    [name] KLOR-CON M20 20 MEQ tablet TAKE 1 TABLET (20 MEQ TOTAL) BY MOUTH EVERY OTHER DAY. 15 tablet 5  . lidocaine (LIDODERM) 5 %   5  . lisinopril (PRINIVIL,ZESTRIL) 5 MG tablet TAKE 1 TABLET (5 MG TOTAL) BY MOUTH DAILY. 30 tablet 1  . Multiple Vitamins-Minerals (MULTIVITAMIN WITH MINERALS) tablet Take 1 tablet by mouth daily.      . nabumetone (RELAFEN) 500 MG tablet Take 500 mg by mouth 2 (two) times daily.      [name] NEXIUM 40 MG capsule TAKE ONE CAPSULE BY MOUTH EVERY DAY 30 capsule 0  . tapentadol (NUCYNTA) 50 MG TABS tablet Take 50 mg by mouth every 6 (six) hours as needed.    [name] UNABLE TO FIND 1 tablet. i Cool supplement  Take 1 tablet daily for hot flashes    . vitamin E 400 UNIT capsule Take 400 Units by mouth daily.     No current facility-administered medications on file prior to visit.     ROS as in subjective   Objective: BP 122/84   Pulse 96   Temp 98.1 F (36.7 C)   Wt 189 lb 6.4 oz (85.9 kg)   SpO2 97%   BMI 31.52 kg/m   General  appearance: Alert, WD/WN, no distress                             Skin: warm, no rash                           Head: +frontal sinus tenderness,                            Eyes: conjunctiva normal, corneas clear, PERRLA                            Ears: flat TMs, external ear canals normal                          Nose: septum midline, turbinates swollen, with erythema and clear discharge             Mouth/throat: MMM, tongue normal, mild pharyngeal erythema                           Neck: supple, no adenopathy, no thyromegaly, non tender                      Lungs: CTA bilaterally, no wheezes, rales, or rhonchi       Assessment  Encounter Diagnosis  Name Primary?  . Acute non-recurrent frontal sinusitis Yes      Plan: discussed symptoms, exam findings.  Begin medications Amoxicillin antibiotic, short term Afrin QHS to help open nasal passages, Flonase BID.  Call report within a week  Recommendations:  Drink  plenty of water to help flush out mucous  You can continue the Mucinex D this week as long as its not raising your blood pressure  Add nasal saline to flush out the sinuses and nasal passages  Consider adding over the counter Flonase nasal spray to help reduce inflammation in the nose.  You can use this Flonase 1-2 sprays twice daily  For worse stopped up nose, use short term Afrin nasal spray over the counter for 4 days or less  Begin Amoxicillin antibiotic  In the future, consider Mucinex plain or Mucinex DM or Coricidin HBP  In general avoid "D" in cough/congestion medication as the D for sudafed raises blood pressure  Patient was advised to call or return if worse or not improving in the next few days.    Patient voiced understanding of diagnosis, recommendations, and treatment plan.  After visit summary given.

## 2017-06-28 ENCOUNTER — Other Ambulatory Visit: Payer: Self-pay | Admitting: Physician Assistant

## 2017-06-28 ENCOUNTER — Other Ambulatory Visit: Payer: Self-pay | Admitting: Cardiology

## 2017-07-03 ENCOUNTER — Telehealth: Payer: Self-pay | Admitting: Hematology

## 2017-07-03 NOTE — Telephone Encounter (Signed)
FAXED [facility] [phone]

## 2017-07-09 ENCOUNTER — Ambulatory Visit: Payer: PRIVATE HEALTH INSURANCE | Admitting: Cardiology

## 2017-07-11 ENCOUNTER — Other Ambulatory Visit: Payer: Self-pay | Admitting: Medical

## 2017-07-16 DIAGNOSIS — Z01419 Encounter for gynecological examination (general) (routine) without abnormal findings: Secondary | ICD-10-CM | POA: Diagnosis not present

## 2017-08-20 ENCOUNTER — Encounter: Payer: Self-pay | Admitting: *Deleted

## 2017-08-20 NOTE — Progress Notes (Signed)
Called patient and left several messages. Left another one today. I did pull chart and we only had the operative report, I did pull that and called over to Dr Lorie Apley office-they are faxing the path report, I will give to you to sign off on so we can scan into the computer.

## 2017-08-22 ENCOUNTER — Encounter: Payer: Self-pay | Admitting: Family Medicine

## 2017-08-29 ENCOUNTER — Other Ambulatory Visit: Payer: Self-pay | Admitting: Cardiology

## 2017-09-27 ENCOUNTER — Other Ambulatory Visit: Payer: Self-pay | Admitting: Cardiology

## 2017-09-30 NOTE — Telephone Encounter (Signed)
REFILL 

## 2017-10-10 NOTE — Progress Notes (Signed)
Called patient and reached voice mail lmtrc needs cpe.  Next avail around Oct 14. Sent pt email request her to call me.

## 2017-10-13 ENCOUNTER — Encounter: Payer: Self-pay | Admitting: Family Medicine

## 2017-10-13 NOTE — Progress Notes (Signed)
Chief Complaint  Patient presents with  . Hyperlipidemia    non-fasting med check(had peanut butter crackers) Feels very fatigued. Has all over body pain and some days just hurts so much.    . Itching    all over itching x 2 weeks. Has been taking benadryl and zyrtec and this only helps for a while.   . Flu Vaccine    declined.     Patient presents to discuss overall health and for fasting labs.  She hasn't seen me since 2014, and only seen in the office for acute visits. She was asked to schedule a visit after I read a note from her cardiologist (being seen for annual follow-up for h/o right atrial thrombus, hypertensive heart disease and palpitations) which stated in the Assessment/Plan: Hyperlipidemia (Chronic)     Labs should be followed by PCP.  They are very poorly controlled several years ago.  She is not on a statin, would recommend close control of PCP.     She is fasting for lipids today.  Last lipids were: Lab Results  Component Value Date   CHOL 297 (H) 08/22/2011   HDL 52 08/22/2011   LDLCALC 211 (H) 08/22/2011   TRIG 168 (H) 08/22/2011   CHOLHDL 5.7 08/22/2011   Diet: Had some red meat yesterday. Usual routine is infrequent red meat.  Over the last month she is eating more beef sausage, had meatfloaf last week.   Usual diet is lots of chicken and fish. Eats hard-boiled eggs during the week, doesn't eat the yolks.  Has 2-4 scrambled eggs (with yolk) on the weekends. "I love cheese" Uses 2% milk.  She had some complaints of chest wall pain and fatigue at the visit with Dr Ellyn Hack. She was diagnosed with costochondritis. She was treated with a short steroid taper. This resolved the pain. Her blood pressure and palpitations were controlled with the regimen prescribed by cardiologist. Uses the lasix every other day, along with potassium.  Gets slight swelling on the days she doesn't take the lasix. Doing well on her current regimen.  She missed her last oncology visit,  due 05/2017.  Last seen in 07/2016 when she had complaint of a rash on her breast. Mammogram is UTD, last 10/2016.  She scheduled a physical in December. She sees GYN, Dr. Garwin Brothers, last saw in December.  Some chronic pain, back. Not well controlled. Today hurts in her full body. 8/10 today. Pain doctor is Dr. Brien Few.  She is complaining of itching, in whole body, starting 1-2 weeks ago.  Itching is all over.  No new soaps, detergents or products.  Denies rash, just itches. She used to take zyrtec every other day (for allergies), changed to benadryl when the itching started--took it 3 days in a row when it first started, not using daily. Had only taken the benadryl at bedtime, and stopped the zyrtec.  Fullness and bloating with slight nausea after eating.  Used to take 46m nexium rx, has been taking 1 OTC for the last year or two.   These symptoms started in the last 2 weeks ago, needing to take Tums once/day.  PMH, PSH, SH reviewed  Outpatient Encounter Medications as of 10/14/2017  Medication Sig Note  . ascorbic acid (VITAMIN C) 250 MG tablet Take 500 mg by mouth daily.   . Calcium Carbonate-Vitamin D (CALCIUM + D) 600-200 MG-UNIT TABS Take 2 tablets by mouth daily.     . cetirizine (ZYRTEC) 10 MG tablet Take 10 mg by mouth  daily.   . diltiazem (CARDIZEM CD) 180 MG 24 hr capsule Take 1 capsule (180 mg total) by mouth daily.   . diphenhydrAMINE (BENADRYL) 25 MG tablet Take 25 mg by mouth every 6 (six) hours as needed.   . fluticasone (FLONASE) 50 MCG/ACT nasal spray PLACE 2 SPRAYS DAILY INTO BOTH NOSTRILS.   . furosemide (LASIX) 40 MG tablet Take 1 tablet (40 mg total) by mouth daily. 10/14/2017: Takes every other day  . gabapentin (NEURONTIN) 600 MG tablet Take 600 mg by mouth at bedtime.   [name] KLOR-CON M20 20 MEQ tablet TAKE 1 TABLET (20 MEQ TOTAL) BY MOUTH EVERY OTHER DAY.   [name] lisinopril (PRINIVIL,ZESTRIL) 5 MG tablet TAKE 1 TABLET BY MOUTH EVERY DAY   . methocarbamol (ROBAXIN) 500 MG tablet  Take 500 mg by mouth as needed for muscle spasms.   . Multiple Vitamins-Minerals (MULTIVITAMIN WITH MINERALS) tablet Take 1 tablet by mouth daily.     . nabumetone (RELAFEN) 500 MG tablet Take 500 mg by mouth 2 (two) times daily.     [name] NEXIUM 40 MG capsule TAKE ONE CAPSULE BY MOUTH EVERY DAY 10/14/2017: Takes OTC Nexium daily now, not the 8m rx  . oxyCODONE-acetaminophen (PERCOCET/ROXICET) 5-325 MG tablet Take 1 tablet by mouth every 6 (six) hours as needed for severe pain. 10/14/2017: Takes 4/day  . oxymetazoline (AFRIN NASAL SPRAY) 0.05 % nasal spray Place 1 spray 2 (two) times daily into both nostrils.   . vitamin E 400 UNIT capsule Take 400 Units by mouth daily.   .Marland Kitchenlidocaine (LIDODERM) 5 %  06/10/2014: Received from: External Pharmacy  . [DISCONTINUED] amoxicillin (AMOXIL) 875 MG tablet Take 1 tablet (875 mg total) 2 (two) times daily by mouth.   . [DISCONTINUED] diclofenac (VOLTAREN) 75 MG EC tablet Take 1 tablet (75 mg total) by mouth 2 (two) times daily.   . [DISCONTINUED] tapentadol (NUCYNTA) 50 MG TABS tablet Take 50 mg by mouth every 6 (six) hours as needed.   . [DISCONTINUED] UNABLE TO FIND 1 tablet. i Cool supplement  Take 1 tablet daily for hot flashes    No facility-administered encounter medications on file as of 10/14/2017.    Allergies  Allergen Reactions  . Adhesive [Tape] Hives  . Morphine Hives   ROS: no fever, chills, URI symptoms  +itching, chronic pain, GI complaints as per HPI.  No chest pain, palpitations, shortness of breath, rash, bleeding, bruising, mood changes or other concerns.   PHYSICAL EXAM:  BP 122/72   Pulse 80   Ht 5' 5"  (1.651 m)   Wt 190 lb 12.8 oz (86.5 kg)   BMI 31.75 kg/m   Wt Readings from Last 3 Encounters:  10/14/17 190 lb 12.8 oz (86.5 kg)  06/14/17 189 lb 6.4 oz (85.9 kg)  06/05/17 188 lb 6.4 oz (85.5 kg)   Well appearing, pleasant female in no distress HEENT: conjunctiva and sclera are clear EOMI, OP clear Neck: no  lymphadenopathy, thyromegaly, carotid bruit Heart: regular rate and rhythm Lungs: clear bilaterally Back: no CVA or spinal tenderness Abdomen: soft, nontender, no mass.  No organomegaly. 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 Can increase to 2 OTC Nexium for up to 2 weeks, and if sx recur at lower dose, may call for rx 14m refill.  Other chronic pain - under care of Dr. BBrien Few Personal history of breast cancer - past due for oncology f/u, reminded to schedule. Mammo due April    c-met, lipids, TSH, CBC Vit D screen was normal in 05/2013   Please remember to call Dr. FBurr Medicoto reschedule your oncology visit. Your mammogram is due in April  Restart your zyrtec, and take it every day.  You may also take the benadryl at bedtime, only if needed for itching. Moisturize your skin well, and try to avoid prolonged hot bath/showers.  I'm suspecting that your cholesterol will still be quite high.  We discussed possibly starting statin medications, in addition to making changes in your diet. Coenzyme Q10 is a supplement that can help  decrease the muscles aches that some people get with statins. We briefly discussed Crestor (rosuvastatin)   ADDENDUM: Lab Results  Component Value Date   CHOL 297 (H) 10/14/2017   HDL 47 10/14/2017   LDLCALC 217 (H) 10/14/2017   TRIG 166 (H) 10/14/2017   CHOLHDL 6.3 (H) 10/14/2017   To start 267mCrestor; to schedule fasting lab visit in 8-10 wks

## 2017-10-14 ENCOUNTER — Encounter: Payer: Self-pay | Admitting: Family Medicine

## 2017-10-14 ENCOUNTER — Telehealth: Payer: Self-pay | Admitting: *Deleted

## 2017-10-14 ENCOUNTER — Ambulatory Visit (INDEPENDENT_AMBULATORY_CARE_PROVIDER_SITE_OTHER): Payer: PRIVATE HEALTH INSURANCE | Admitting: Family Medicine

## 2017-10-14 VITALS — BP 122/72 | HR 80 | Ht 65.0 in | Wt 190.8 lb

## 2017-10-14 DIAGNOSIS — R5383 Other fatigue: Secondary | ICD-10-CM

## 2017-10-14 DIAGNOSIS — I10 Essential (primary) hypertension: Secondary | ICD-10-CM

## 2017-10-14 DIAGNOSIS — I110 Hypertensive heart disease with heart failure: Secondary | ICD-10-CM | POA: Diagnosis not present

## 2017-10-14 DIAGNOSIS — Z853 Personal history of malignant neoplasm of breast: Secondary | ICD-10-CM | POA: Diagnosis not present

## 2017-10-14 DIAGNOSIS — Z5181 Encounter for therapeutic drug level monitoring: Secondary | ICD-10-CM | POA: Diagnosis not present

## 2017-10-14 DIAGNOSIS — R002 Palpitations: Secondary | ICD-10-CM | POA: Diagnosis not present

## 2017-10-14 DIAGNOSIS — G8929 Other chronic pain: Secondary | ICD-10-CM | POA: Diagnosis not present

## 2017-10-14 DIAGNOSIS — I5030 Unspecified diastolic (congestive) heart failure: Secondary | ICD-10-CM | POA: Diagnosis not present

## 2017-10-14 DIAGNOSIS — K219 Gastro-esophageal reflux disease without esophagitis: Secondary | ICD-10-CM | POA: Diagnosis not present

## 2017-10-14 DIAGNOSIS — E782 Mixed hyperlipidemia: Secondary | ICD-10-CM | POA: Diagnosis not present

## 2017-10-14 NOTE — Patient Instructions (Addendum)
Please remember to call Dr. Burr Medico to reschedule your oncology visit. Your mammogram is due in April  Restart your zyrtec, and take it every day.  You may also take the benadryl at bedtime, only if needed for itching. Moisturize your skin well, and try to avoid prolonged hot bath/showers.  I'm suspecting that your cholesterol will still be quite high.  We discussed possibly starting statin medications, in addition to making changes in your diet. Coenzyme Q10 is a supplement that can help decrease the muscles aches that some people get with statins. We briefly discussed Crestor (rosuvastatin)    Fat and Cholesterol Restricted Diet Getting too much fat and cholesterol in your diet may cause health problems. Following this diet helps keep your fat and cholesterol at normal levels. This can keep you from getting sick. What types of fat should I choose?  Choose monosaturated and polyunsaturated fats. These are found in foods such as olive oil, canola oil, flaxseeds, walnuts, almonds, and seeds.  Eat more omega-3 fats. Good choices include salmon, mackerel, sardines, tuna, flaxseed oil, and ground flaxseeds.  Limit saturated fats. These are in animal products such as meats, butter, and cream. They can also be in plant products such as palm oil, palm kernel oil, and coconut oil.  Avoid foods with partially hydrogenated oils in them. These contain trans fats. Examples of foods that have trans fats are stick margarine, some tub margarines, cookies, crackers, and other baked goods. What general guidelines do I need to follow?  Check food labels. Look for the words "trans fat" and "saturated fat."  When preparing a meal: ? Fill half of your plate with vegetables and green salads. ? Fill one fourth of your plate with whole grains. Look for the word "whole" as the first word in the ingredient list. ? Fill one fourth of your plate with lean protein foods.  Eat more foods that have fiber, like  apples, carrots, beans, peas, and barley.  Eat more home-cooked foods. Eat less at restaurants and buffets.  Limit or avoid alcohol.  Limit foods high in starch and sugar.  Limit fried foods.  Cook foods without frying them. Baking, boiling, grilling, and broiling are all great options.  Lose weight if you are overweight. Losing even a small amount of weight can help your overall health. It can also help prevent diseases such as diabetes and heart disease. What foods can I eat? Grains Whole grains, such as whole wheat or whole grain breads, crackers, cereals, and pasta. Unsweetened oatmeal, bulgur, barley, quinoa, or brown rice. Corn or whole wheat flour tortillas. Vegetables Fresh or frozen vegetables (raw, steamed, roasted, or grilled). Green salads. Fruits All fresh, canned (in natural juice), or frozen fruits. Meat and Other Protein Products Ground beef (85% or leaner), grass-fed beef, or beef trimmed of fat. Skinless chicken or [place]. Ground chicken or [place]. Pork trimmed of fat. All fish and seafood. Eggs. Dried beans, peas, or lentils. Unsalted nuts or seeds. Unsalted canned or dry beans. Dairy Low-fat dairy products, such as skim or 1% milk, 2% or reduced-fat cheeses, low-fat ricotta or cottage cheese, or plain low-fat yogurt. Fats and Oils Tub margarines without trans fats. Light or reduced-fat mayonnaise and salad dressings. Avocado. Olive, canola, sesame, or safflower oils. Natural peanut or almond butter (choose ones without added sugar and oil). The items listed above may not be a complete list of recommended foods or beverages. Contact your dietitian for more options. What foods are not recommended? Grains White bread.  White pasta. White rice. Cornbread. Bagels, pastries, and croissants. Crackers that contain trans fat. Vegetables White potatoes. Corn. Creamed or fried vegetables. Vegetables in a cheese sauce. Fruits Dried fruits. Canned fruit in light or heavy syrup.  Fruit juice. Meat and Other Protein Products Fatty cuts of meat. Ribs, chicken wings, bacon, sausage, bologna, salami, chitterlings, fatback, hot dogs, bratwurst, and packaged luncheon meats. Liver and organ meats. Dairy Whole or 2% milk, cream, half-and-half, and cream cheese. Whole milk cheeses. Whole-fat or sweetened yogurt. Full-fat cheeses. Nondairy creamers and whipped toppings. Processed cheese, cheese spreads, or cheese curds. Sweets and Desserts Corn syrup, sugars, honey, and molasses. Candy. Jam and jelly. Syrup. Sweetened cereals. Cookies, pies, cakes, donuts, muffins, and ice cream. Fats and Oils Butter, stick margarine, lard, shortening, ghee, or bacon fat. Coconut, palm kernel, or palm oils. Beverages Alcohol. Sweetened drinks (such as sodas, lemonade, and fruit drinks or punches). The items listed above may not be a complete list of foods and beverages to avoid. Contact your dietitian for more information. This information is not intended to replace advice given to you by your health care provider. Make sure you discuss any questions you have with your health care provider. Document Released: 01/15/2012 Document Revised: 03/22/2016 Document Reviewed: 10/15/2013 Elsevier Interactive Patient Education  2018 [place] for Gastroesophageal Reflux Disease, Adult When you have gastroesophageal reflux disease (GERD), the foods you eat and your eating habits are very important. Choosing the right foods can help ease the discomfort of GERD. Consider working with a diet and nutrition specialist (dietitian) to help you make healthy food choices. What general guidelines should I follow? Eating plan  Choose healthy foods low in fat, such as fruits, vegetables, whole grains, low-fat dairy products, and lean meat, fish, and poultry.  Eat frequent, small meals instead of three large meals each day. Eat your meals slowly, in a relaxed setting. Avoid bending over or lying  down until 2-3 hours after eating.  Limit high-fat foods such as fatty meats or fried foods.  Limit your intake of oils, butter, and shortening to less than 8 teaspoons each day.  Avoid the following: ? Foods that cause symptoms. These may be different for different people. Keep a food diary to keep track of foods that cause symptoms. ? Alcohol. ? Drinking large amounts of liquid with meals. ? Eating meals during the 2-3 hours before bed.  Cook foods using methods other than frying. This may include baking, grilling, or broiling. Lifestyle   Maintain a healthy weight. Ask your health care provider what weight is healthy for you. If you need to lose weight, work with your health care provider to do so safely.  Exercise for at least 30 minutes on 5 or more days each week, or as told by your health care provider.  Avoid wearing clothes that fit tightly around your waist and chest.  Do not use any products that contain nicotine or tobacco, such as cigarettes and e-cigarettes. If you need help quitting, ask your health care provider.  Sleep with the head of your bed raised. Use a wedge under the mattress or blocks under the bed frame to raise the head of the bed. What foods are not recommended? The items listed may not be a complete list. Talk with your dietitian about what dietary choices are best for you. Grains Pastries or quick breads with added fat. [place] toast. Vegetables Deep fried vegetables. [place] fries. Any vegetables prepared with added fat. Any vegetables  that cause symptoms. For some people this may include tomatoes and tomato products, chili peppers, onions and garlic, and horseradish. Fruits Any fruits prepared with added fat. Any fruits that cause symptoms. For some people this may include citrus fruits, such as oranges, grapefruit, pineapple, and lemons. Meats and other protein foods High-fat meats, such as fatty beef or pork, hot dogs, ribs, ham, sausage, salami and  bacon. Fried meat or protein, including fried fish and fried chicken. Nuts and nut butters. Dairy Whole milk and chocolate milk. Sour cream. Cream. Ice cream. Cream cheese. Milk shakes. Beverages Coffee and tea, with or without caffeine. Carbonated beverages. Sodas. Energy drinks. Fruit juice made with acidic fruits (such as orange or grapefruit). Tomato juice. Alcoholic drinks. Fats and oils Butter. Margarine. Shortening. Ghee. Sweets and desserts Chocolate and cocoa. Donuts. Seasoning and other foods Pepper. Peppermint and spearmint. Any condiments, herbs, or seasonings that cause symptoms. For some people, this may include curry, hot sauce, or vinegar-based salad dressings. Summary  When you have gastroesophageal reflux disease (GERD), food and lifestyle choices are very important to help ease the discomfort of GERD.  Eat frequent, small meals instead of three large meals each day. Eat your meals slowly, in a relaxed setting. Avoid bending over or lying down until 2-3 hours after eating.  Limit high-fat foods such as fatty meat or fried foods. This information is not intended to replace advice given to you by your health care provider. Make sure you discuss any questions you have with your health care provider. Document Released: 07/16/2005 Document Revised: 07/17/2016 Document Reviewed: 07/17/2016 Elsevier Interactive Patient Education  [employer].

## 2017-10-14 NOTE — Telephone Encounter (Signed)
She needs to get this from her pain doctor.

## 2017-10-14 NOTE — Telephone Encounter (Signed)
Said she forgot to ask for her lidoderm patches to be refilled and sent to [facility].

## 2017-10-14 NOTE — Telephone Encounter (Signed)
Patient advised.

## 2017-10-15 LAB — COMPREHENSIVE METABOLIC PANEL
ALK PHOS: 97 IU/L (ref 39–117)
ALT: 21 IU/L (ref 0–32)
AST: 27 IU/L (ref 0–40)
Albumin/Globulin Ratio: 1.6 (ref 1.2–2.2)
Albumin: 4.4 g/dL (ref 3.5–5.5)
BUN/Creatinine Ratio: 9 (ref 9–23)
BUN: 8 mg/dL (ref 6–24)
CHLORIDE: 103 mmol/L (ref 96–106)
CO2: 21 mmol/L (ref 20–29)
CREATININE: 0.85 mg/dL (ref 0.57–1.00)
Calcium: 9.6 mg/dL (ref 8.7–10.2)
GFR calc Af Amer: 92 mL/min/{1.73_m2} (ref >59)
GFR calc non Af Amer: 80 mL/min/{1.73_m2} (ref >59)
Globulin, Total: 2.8 g/dL (ref 1.5–4.5)
Glucose: 98 mg/dL (ref 65–99)
Potassium: 3.8 mmol/L (ref 3.5–5.2)
Sodium: 143 mmol/L (ref 134–144)
Total Protein: 7.2 g/dL (ref 6.0–8.5)

## 2017-10-15 LAB — CBC WITH DIFFERENTIAL/PLATELET
BASOS ABS: 0 10*3/uL (ref 0.0–0.2)
Basos: 0 %
EOS (ABSOLUTE): 0.1 10*3/uL (ref 0.0–0.4)
EOS: 2 %
HEMOGLOBIN: 14 g/dL (ref 11.1–15.9)
Hematocrit: 39.8 % (ref 34.0–46.6)
IMMATURE GRANS (ABS): 0 10*3/uL (ref 0.0–0.1)
IMMATURE GRANULOCYTES: 0 %
LYMPHS: 39 %
Lymphocytes Absolute: 1.9 10*3/uL (ref 0.7–3.1)
MCH: 31.5 pg (ref 26.6–33.0)
MCHC: 35.2 g/dL (ref 31.5–35.7)
MCV: 90 fL (ref 79–97)
Monocytes Absolute: 0.4 10*3/uL (ref 0.1–0.9)
Monocytes: 7 %
NEUTROS PCT: 52 %
Neutrophils Absolute: 2.6 10*3/uL (ref 1.4–7.0)
Platelets: 282 10*3/uL (ref 150–379)
RBC: 4.44 x10E6/uL (ref 3.77–5.28)
RDW: 13.2 % (ref 12.3–15.4)
WBC: 4.9 10*3/uL (ref 3.4–10.8)

## 2017-10-15 LAB — LIPID PANEL
Chol/HDL Ratio: 6.3 ratio — ABNORMAL HIGH (ref 0.0–4.4)
Cholesterol, Total: 297 mg/dL — ABNORMAL HIGH (ref 100–199)
HDL: 47 mg/dL (ref >39)
LDL Calculated: 217 mg/dL — ABNORMAL HIGH (ref 0–99)
Triglycerides: 166 mg/dL — ABNORMAL HIGH (ref 0–149)
VLDL Cholesterol Cal: 33 mg/dL (ref 5–40)

## 2017-10-15 LAB — TSH: TSH: 1.38 u[IU]/mL (ref 0.450–4.500)

## 2017-10-15 LAB — VITAMIN D 25 HYDROXY (VIT D DEFICIENCY, FRACTURES): VIT D 25 HYDROXY: 46.3 ng/mL (ref 30.0–100.0)

## 2017-10-15 MED ORDER — ROSUVASTATIN CALCIUM 20 MG PO TABS: 20.0000 mg | ORAL_TABLET | Freq: Every day | ORAL | 2 refills | Status: DC

## 2017-10-29 ENCOUNTER — Ambulatory Visit (INDEPENDENT_AMBULATORY_CARE_PROVIDER_SITE_OTHER): Payer: PRIVATE HEALTH INSURANCE | Admitting: Cardiology

## 2017-10-29 ENCOUNTER — Encounter: Payer: Self-pay | Admitting: Cardiology

## 2017-10-29 VITALS — BP 126/90 | HR 74 | Ht 66.0 in | Wt 194.0 lb

## 2017-10-29 DIAGNOSIS — R600 Localized edema: Secondary | ICD-10-CM | POA: Insufficient documentation

## 2017-10-29 DIAGNOSIS — R002 Palpitations: Secondary | ICD-10-CM | POA: Diagnosis not present

## 2017-10-29 DIAGNOSIS — E785 Hyperlipidemia, unspecified: Secondary | ICD-10-CM

## 2017-10-29 DIAGNOSIS — I10 Essential (primary) hypertension: Secondary | ICD-10-CM | POA: Diagnosis not present

## 2017-10-29 MED ORDER — POTASSIUM CHLORIDE CRYS ER 20 MEQ PO TBCR: 20.0000 meq | EXTENDED_RELEASE_TABLET | ORAL | 5 refills | Status: DC

## 2017-10-29 MED ORDER — LISINOPRIL 5 MG PO TABS: 5.0000 mg | ORAL_TABLET | Freq: Every day | ORAL | 11 refills | Status: DC

## 2017-10-29 MED ORDER — DILTIAZEM HCL ER COATED BEADS 180 MG PO CP24: 180.0000 mg | ORAL_CAPSULE | Freq: Every day | ORAL | 3 refills | Status: DC

## 2017-10-29 NOTE — Patient Instructions (Addendum)
NO MEDICATION CHANGES    RECOMMEND WEARING SUPPORT SOCKS OR KNEE HI  ( 8-10 MMHG OR 10-15 MMHG) MAY BUY OVER THE COUNTER   INCREASE LEG EXERCISE AND WALKING.    Your physician discussed the importance of regular exercise and recommended that you start or continue a regular exercise program for good health and decrease dietary intake.   Recommend  You restarting Crestor for cholesterol - primary will refill.    Your physician wants you to follow-up in 12 months with DR HARDING.You will receive a reminder letter in the mail two months in advance. If you don't receive a letter, please call our office to schedule the follow-up appointment.

## 2017-10-29 NOTE — Assessment & Plan Note (Signed)
On every other day Lasix.  Recommend support stockings as this may very well be signs of venous insufficiency.  Also talked about leg exercises and ambulation along with foot elevation when possible.

## 2017-10-29 NOTE — Assessment & Plan Note (Signed)
Well-controlled with diltiazem 

## 2017-10-29 NOTE — Assessment & Plan Note (Signed)
Stable on current dose of calcium channel blocker and ACE inhibitor

## 2017-10-29 NOTE — Assessment & Plan Note (Signed)
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  No longer on anticoagulation

## 2017-10-29 NOTE — Assessment & Plan Note (Addendum)
Lipids are well out of control off of statin.  We talked about dietary modification and exercise. She would like to try  avoiding medical management, but I think she probably will need to be on Crestor --> provided she has not made any dramatic improvement with lifestyle modification.  This with her currently having leg aching symptoms, I do not think starting statin at this point is a good idea until she is convinced that this is not related to statin.  Once her leg aching has improved, would restart Crestor probably starting at 20 mg and then titrating to 40.  I will defer to PCP to manage.

## 2017-10-29 NOTE — Progress Notes (Signed)
PCP: Rita Ohara, MD  Clinic Note: Chief Complaint  Patient presents with  . Follow-up    Patient says that her legs cramp and ache a lot.  . Edema    Ankles and feet.    HPI: Olivia [name] is a 52 y.o. female with a PMH below who presents today for annual follow-up for right atrial thrombus, hypertensive heart disease and palpitations.  She was noted to have a right atrial thrombus back in 2014 thought to be related to a PICC line.  She has hyperlipidemia not on statin currently.  She also has mild lower extremity edema with legs aching and cramping.  Olivia [name] was last seen on Jun 05, 2017 - costochondritis - steroid taper - felt much better.  Recent Hospitalizations: None  Studies Personally Reviewed - (if available, images/films reviewed: From Epic Chart or Care Everywhere)  None since March 2016  Interval History: [name] presents today still doing well from a cardiac standpoint.  Her costochondritis pain is totally resolved stating that the steroid taper worked wonders.  She is not having any sensation of rapid irregular heartbeats palpitations.  No recurrent chest tightness or pressure with rest or exertion.  No resting or exertional dyspnea.  No PND orthopnea, but she does have some mild end of the edema that is seems to be more stable now.  She is taking Lasix every other day.  She notes that by mid day and then today her lower legs released tend to ache quite a bit.  She is not up and about very much during the course of the day, sitting down most the time at school.  She denies any lightheadedness, dizziness, syncope/near syncope or TIA/amaurosis fugax.  No claudication.  ROS: A comprehensive was performed. Review of Systems  Constitutional: Negative for malaise/fatigue and weight loss.  HENT: Negative for congestion and nosebleeds.   Respiratory: Negative for cough, shortness of breath and wheezing.   Cardiovascular: Positive for leg swelling. Negative for chest  pain.  Gastrointestinal: Negative for abdominal pain, blood in stool, heartburn and melena.  Genitourinary: Negative for flank pain and hematuria.  Musculoskeletal: Negative for falls and joint pain.       Legs ache at the end of the day  Neurological: Negative for dizziness and weakness.  Psychiatric/Behavioral: The patient is not nervous/anxious.   All other systems reviewed and are negative.  I have reviewed and (if needed) personally updated the patient's problem list, medications, allergies, past medical and surgical history, social and family history.   Past Medical History:  Diagnosis Date  . Breast cancer, stage 1 ([place])   . Colon polyp   . GERD (gastroesophageal reflux disease)   . H/O diastolic dysfunction 08/5425   Grade 2 DD. 1.3 x 1.3 cm mass in RA on Echo -->Cardiac MRI 12/04: 1.9 x 1.6 cm mass in the lateral RA cavity. -- Most suggestive of thrombus--  calcified and  lamina and ted suggestive of chronic.; EF from 50% with no scar.  Olivia [name] History of breast cancer 2010   invasive ductal R breast; s/p lumpectomy, radiation and chemo  . Lumbar degenerative disc disease   . Obesity   . Personal history of chemotherapy   . Personal history of radiation therapy   . 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  Olivia [name] Uterine fibroid    s/p embolization  Past Surgical History:  Procedure Laterality Date  . BREAST BIOPSY Right 12/31/2012  . BREAST BIOPSY Right 09/06/2008  . BREAST BIOPSY Right 08/11/2008  . BREAST LUMPECTOMY  2010   right  . Cardiac MRI  December 2014   1.9 x 1.6 cm mass in the lateral RA cavity. -- Most suggestive of thrombus--  calcified anndd  laammiinnaated suggestive of chronic.; EF from 50-4% with no scar. Trivial effusion  . COLONOSCOPY  2010  . LUMBAR LAMINECTOMY/DECOMPRESSION MICRODISCECTOMY  2007, 2008   Dr. Hal Neer x 2  . POWER  PORT    . TRANSTHORACIC ECHOCARDIOGRAM  November 2014; March 2016t   a) Mild LVH, EF of roughly 50%. Pseudo-normal LV filling (grade 3 diastolic soft. Mild LA dilation. 1.3 x 1.3 cm mass in right atrium.;; b) Low normal LV function; grade 2 diastolic dysfunction; calcified right atrial mass; no change compared to 06/11/13.  . TUBAL LIGATION  2010  . UTERINE FIBROID EMBOLIZATION  2008 or 2009    Current Meds  Medication Sig  . ascorbic acid (VITAMIN C) 250 MG tablet Take 500 mg by mouth daily.  . Calcium Carbonate-Vitamin D (CALCIUM + D) 600-200 MG-UNIT TABS Take 2 tablets by mouth daily.    . cetirizine (ZYRTEC) 10 MG tablet Take 10 mg by mouth daily.  Olivia [name] diltiazem (CARDIZEM CD) 180 MG 24 hr capsule Take 1 capsule (180 mg total) by mouth daily.  . diphenhydrAMINE (BENADRYL) 25 MG tablet Take 25 mg by mouth every 6 (six) hours as needed.  . fluticasone (FLONASE) 50 MCG/ACT nasal spray PLACE 2 SPRAYS DAILY INTO BOTH NOSTRILS.  . furosemide (LASIX) 40 MG tablet Take 1 tablet (40 mg total) by mouth daily.  Olivia [name] gabapentin (NEURONTIN) 600 MG tablet Take 600 mg by mouth at bedtime.  . lidocaine (LIDODERM) 5 %   . lisinopril (PRINIVIL,ZESTRIL) 5 MG tablet Take 1 tablet (5 mg total) by mouth daily.  . methocarbamol (ROBAXIN) 500 MG tablet Take 500 mg by mouth as needed for muscle spasms.  . Multiple Vitamins-Minerals (MULTIVITAMIN WITH MINERALS) tablet Take 1 tablet by mouth daily.    . nabumetone (RELAFEN) 500 MG tablet Take 500 mg by mouth 2 (two) times daily.    Olivia [name] NEXIUM 40 MG capsule TAKE ONE CAPSULE BY MOUTH EVERY DAY  . oxyCODONE-acetaminophen (PERCOCET/ROXICET) 5-325 MG tablet Take 1 tablet by mouth every 6 (six) hours as needed for severe pain.  Olivia [name] oxymetazoline (AFRIN NASAL SPRAY) 0.05 % nasal spray Place 1 spray 2 (two) times daily into both nostrils.  . potassium chloride SA (KLOR-CON M20) 20 MEQ tablet Take 1 tablet (20 mEq total) by mouth every other day.  . rosuvastatin (CRESTOR) 20 MG  tablet Take 1 tablet (20 mg total) by mouth daily.  . vitamin E 400 UNIT capsule Take 400 Units by mouth daily.  . [DISCONTINUED] diltiazem (CARDIZEM CD) 180 MG 24 hr capsule Take 1 capsule (180 mg total) by mouth daily.  . [DISCONTINUED] KLOR-CON M20 20 MEQ tablet TAKE 1 TABLET (20 MEQ TOTAL) BY MOUTH EVERY OTHER DAY.  . [DISCONTINUED] lisinopril (PRINIVIL,ZESTRIL) 5 MG tablet TAKE 1 TABLET BY MOUTH EVERY DAY    Allergies  Allergen Reactions  . Adhesive [Tape] Hives  . Morphine Hives    Social History   Tobacco Use  . Smoking status: Never Smoker  . Smokeless tobacco: Never Used  Substance Use Topics  . Alcohol use: No  . Drug use: No   Social History   Social History Narrative  She is married. Has 1 son, married, age 34 (09/2017).   She walks with a cane sometimes. She is currently disabled.   Never smoked.   She does not exercise regularly, occasional walking   Student for interior design at [employer]    family history includes Breast cancer in her mother; Cancer in her maternal grandmother.  Wt Readings from Last 3 Encounters:  10/29/17 194 lb (88 kg)  10/14/17 190 lb 12.8 oz (86.5 kg)  06/14/17 189 lb 6.4 oz (85.9 kg)    PHYSICAL EXAM BP 126/90 (BP Location: Left Arm, Patient Position: Sitting, Cuff Size: Normal)   Pulse 74   Ht 5\' 6"  (1.676 m)   Wt 194 lb (88 kg)   BMI 31.31 kg/m  Physical Exam  Constitutional: She is oriented to person, place, and time. She appears well-developed and well-nourished. No distress.  Mildly obese  HENT:  Head: Normocephalic and atraumatic.  Neck: No hepatojugular reflux and no JVD present. Carotid bruit is not present.  Cardiovascular: Normal rate, regular rhythm and intact distal pulses. PMI is not displaced. Exam reveals gallop and S4 (soft).  Murmur (soft SEM) heard. Pulmonary/Chest: Effort normal and breath sounds normal. No respiratory distress. She has no wheezes. She has no rales.  Abdominal: Soft. Bowel sounds are normal.  She exhibits no distension. There is no tenderness. There is no rebound.  Musculoskeletal: Normal range of motion. She exhibits edema (Trivial; no varicose veins or venous stasis changes).  Neurological: She is alert and oriented to person, place, and time.  Psychiatric: She has a normal mood and affect. Her behavior is normal. Judgment and thought content normal.  Nursing note and vitals reviewed.    Adult ECG Report Not chest  Other studies Reviewed: Additional studies/ records that were reviewed today include:  Recent Labs:   Lab Results  Component Value Date   CREATININE 0.85 10/14/2017   BUN 8 10/14/2017   NA 143 10/14/2017   K 3.8 10/14/2017   CL 103 10/14/2017   CO2 21 10/14/2017  Cholesterol levels checked by PCP October 14, 2017: Lab Results  Component Value Date   CHOL 297 (H) 10/14/2017   HDL 47 10/14/2017   LDLCALC 217 (H) 10/14/2017   TRIG 166 (H) 10/14/2017   CHOLHDL 6.3 (H) 10/14/2017  -- has NOT BEEN TAKING CRESTOR.    ASSESSMENT / PLAN: Problem List Items Addressed This Visit    Palpitations (Chronic)    Well-controlled with diltiazem      Hyperlipidemia with target LDL less than 130 (Chronic)    Lipids are well out of control off of statin.  We talked about dietary modification and exercise. She would like to try  avoiding medical management, but I think she probably will need to be on Crestor --> provided she has not made any dramatic improvement with lifestyle modification.  This with her currently having leg aching symptoms, I do not think starting statin at this point is a good idea until she is convinced that this is not related to statin.  Once her leg aching has improved, would restart Crestor probably starting at 20 mg and then titrating to 40.  I will defer to PCP to manage.         Relevant Medications   lisinopril (PRINIVIL,ZESTRIL) 5 MG tablet   diltiazem (CARDIZEM CD) 180 MG 24 hr capsule   Essential hypertension, benign (Chronic)     Stable on current dose of calcium channel blocker and ACE inhibitor  Relevant Medications   lisinopril (PRINIVIL,ZESTRIL) 5 MG tablet   diltiazem (CARDIZEM CD) 180 MG 24 hr capsule   Bilateral lower extremity edema - Primary (Chronic)    On every other day Lasix.  Recommend support stockings as this may very well be signs of venous insufficiency.  Also talked about leg exercises and ambulation along with foot elevation when possible.         Current medicines are reviewed at length with the patient today. (+/- concerns) n/a The following changes have been made: n/a  Patient Instructions  NO MEDICATION CHANGES    RECOMMEND WEARING SUPPORT SOCKS OR KNEE HI  ( 8-10 MMHG OR 10-15 MMHG) MAY BUY OVER THE COUNTER   INCREASE LEG EXERCISE AND WALKING.    Your physician discussed the importance of regular exercise and recommended that you start or continue a regular exercise program for good health and decrease dietary intake.   Recommend  You restarting Crestor for cholesterol - primary will refill.    Your physician wants you to follow-up in 12 months with DR HARDING.You will receive a reminder letter in the mail two months in advance. If you don't receive a letter, please call our office to schedule the follow-up appointment.      Studies Ordered:   No orders of the defined types were placed in this encounter.     Glenetta Hew, M.D., M.S. Interventional Cardiologist   Pager # [phone] Phone # [phone] [address] [place], [place] [address]

## 2017-12-24 ENCOUNTER — Other Ambulatory Visit: Payer: Self-pay | Admitting: Obstetrics and Gynecology

## 2017-12-24 DIAGNOSIS — Z1231 Encounter for screening mammogram for malignant neoplasm of breast: Secondary | ICD-10-CM

## 2018-01-14 ENCOUNTER — Ambulatory Visit
Admission: RE | Admit: 2018-01-14 | Discharge: 2018-01-14 | Disposition: A | Discharge status: Home / Self Care | Payer: PRIVATE HEALTH INSURANCE | Source: Ambulatory Visit | Attending: Obstetrics and Gynecology | Admitting: Obstetrics and Gynecology

## 2018-01-14 DIAGNOSIS — Z1231 Encounter for screening mammogram for malignant neoplasm of breast: Secondary | ICD-10-CM

## 2018-01-14 NOTE — Progress Notes (Deleted)
    Patient was complaining of fatigue at her last visit in March.  Labs were done--normal CBC, c-met, TSH, VIt D.  Lipids were also addressed at that visit. She was prescribed Crestor, but never started it. She saw her cardiologist shortly after our visit, where Crestor was prescribed, with some LE complaints (prior to starting Crestor).  He suggested that with her having leg aching symptoms, he didn't recommend starting statin at this point is a good idea until she is convinced that this is not related to statin.  Once her leg aching has improved, would restart Crestor probably starting at 20 mg and then titrating to 40. (he deferred to PCP for management).  Lab Results  Component Value Date   CHOL 297 (H) 10/14/2017   HDL 47 10/14/2017   LDLCALC 217 (H) 10/14/2017   TRIG 166 (H) 10/14/2017   CHOLHDL 6.3 (H) 10/14/2017

## 2018-01-15 ENCOUNTER — Other Ambulatory Visit: Payer: Self-pay | Admitting: Obstetrics and Gynecology

## 2018-01-15 ENCOUNTER — Ambulatory Visit: Payer: Self-pay | Admitting: Family Medicine

## 2018-01-15 DIAGNOSIS — N63 Unspecified lump in unspecified breast: Secondary | ICD-10-CM

## 2018-01-15 NOTE — Progress Notes (Signed)
Chief Complaint  Patient presents with  . Fatigue    ans SOB for about the last 2-3 weeks. Feels like she is breathing really hard even to do little things like take a shower.     Patient presents with complaint of 2-3 weeks of fatigue. Started out "just feeling tired".  She tried to catch up on her sleep, but that hasn't made a difference.  When sitting still, she feels tired.  Gasps for air with activity, breathes heavy just even from showering.  Still feels bad, even when sitting still, like a vacuum sucked all the life out of her--extreme fatigue.  She thought it could have been allergies contributing at first. She resumed taking zyrtec (which she has taken in the past without causing fatigue), only takes 1/2 tablet daily, and this hasn't helped. Fatigue preceded taking zyrtec. Currently denies any allergy symptoms. Denies postnasal drainage or chest congestion or cough. Feels a "tiredness in her chest". She fears it could be anemia causing these symptoms, or related to her heart since "chemo damaged my heart". She denies any bleeding (just slight in her nose)  Forgot to take her lasix when she went to visit her parents for a few days.  She had increased swelling, which improved after taking her lasix when she got home.  Feels similar to fluid overload she had in the past (shortness of breath and chest heaviness).  Left leg was aching when the chest heaviness/fatigue first started, was worried about a blood clot.  Had been driving/traveling more.  Legs are now feeling better, denies any pain/swelling  She was last seen in March. Labs were done--normal CBC, c-met, TSH, VIt D.  Lipids were also addressed at that visit. She was prescribed Crestor, but never started it. She saw her cardiologist shortly after our visit, where Crestor was prescribed, with some LE complaints (prior to starting Crestor).  He suggested that with her having leg aching symptoms, he didn't recommend starting statin at  this point is a good idea until she is convinced that this is not related to statin. Once her leg aching has improved, would restart Crestor probably starting at 20 mg and then titrating to 40. (he deferred to PCP for management).       Lab Results  Component Value Date   CHOL 297 (H) 10/14/2017   HDL 47 10/14/2017   LDLCALC 217 (H) 10/14/2017   TRIG 166 (H) 10/14/2017   CHOLHDL 6.3 (H) 10/14/2017    PMH, PSH, SH reviewed  Outpatient Encounter Medications as of 01/16/2018  Medication Sig Note  . ascorbic acid (VITAMIN C) 250 MG tablet Take 500 mg by mouth daily.   . Calcium Carbonate-Vitamin D (CALCIUM + D) 600-200 MG-UNIT TABS Take 2 tablets by mouth daily.     . cetirizine (ZYRTEC) 10 MG tablet Take 10 mg by mouth daily.   [name] diltiazem (CARDIZEM CD) 180 MG 24 hr capsule Take 1 capsule (180 mg total) by mouth daily.   . furosemide (LASIX) 40 MG tablet Take 1 tablet (40 mg total) by mouth daily. 10/14/2017: Takes every other day  . gabapentin (NEURONTIN) 600 MG tablet Take 600 mg by mouth at bedtime.   . lidocaine (LIDODERM) 5 %  06/10/2014: Received from: External Pharmacy  . lisinopril (PRINIVIL,ZESTRIL) 5 MG tablet Take 1 tablet (5 mg total) by mouth daily.   . methocarbamol (ROBAXIN) 500 MG tablet Take 500 mg by mouth as needed for muscle spasms.   . Multiple Vitamins-Minerals (MULTIVITAMIN WITH  MINERALS) tablet Take 1 tablet by mouth daily.     . nabumetone (RELAFEN) 500 MG tablet Take 500 mg by mouth 2 (two) times daily.     [name] NEXIUM 40 MG capsule TAKE ONE CAPSULE BY MOUTH EVERY DAY 10/14/2017: Takes OTC Nexium daily now, not the 47m rx  . potassium chloride SA (KLOR-CON M20) 20 MEQ tablet Take 1 tablet (20 mEq total) by mouth every other day.   . vitamin E 400 UNIT capsule Take 400 Units by mouth daily.   . [DISCONTINUED] oxyCODONE-acetaminophen (PERCOCET/ROXICET) 5-325 MG tablet Take 1 tablet by mouth every 6 (six) hours as needed for severe pain. 10/14/2017: Takes 4/day   . diphenhydrAMINE (BENADRYL) 25 MG tablet Take 25 mg by mouth every 6 (six) hours as needed.   . fluticasone (FLONASE) 50 MCG/ACT nasal spray PLACE 2 SPRAYS DAILY INTO BOTH NOSTRILS. (Patient not taking: Reported on 01/16/2018)   . rosuvastatin (CRESTOR) 20 MG tablet Take 1 tablet (20 mg total) by mouth daily. (Patient not taking: Reported on 01/16/2018)   . [DISCONTINUED] oxymetazoline (AFRIN NASAL SPRAY) 0.05 % nasal spray Place 1 spray 2 (two) times daily into both nostrils.    No facility-administered encounter medications on file as of 01/16/2018.    Allergies  Allergen Reactions  . Adhesive [Tape] Hives  . Morphine Hives    ROS:  No fever, chills, URI symptoms.  No current allergy symptoms. No cough.  +fatigue, dyspnea per HPI.  No nausea, vomiting, bowel changes, urinary complaints, bleeding, bruising, rash. No edema or leg pain currently.  No palpitations.  See HPI. She denies snoring.   PHYSICAL EXAM:  BP (!) 142/82   Pulse 76   Temp 98.2 F (36.8 C) (Tympanic)   Ht _0  (1.676 m)   Wt 193 lb 12.8 oz (87.9 kg)   SpO2 98%   BMI 31.28 kg/m   Wt Readings from Last 3 Encounters:  01/16/18 193 lb 12.8 oz (87.9 kg)  10/29/17 194 lb (88 kg)  10/14/17 190 lb 12.8 oz (86.5 kg)   Well appearing female, speaking easily in full sentences, in no distress HEENT: conjunctiva and sclera are clear, EOMI. OP is clear. Nasal mucosa is normal, sinuses nontender. Neck: no lymphadenopathy, thyromegaly or mass Heart: regular rate and rhythm Chest: nontender to palpitation Lungs: clear bilaterally. No wheezes, rales, ronchi; good air movement Abdomen: soft, nontender, no organomegaly or mass Extremities: negative Homan. No edema, nontender, normal pulses Psych: normal mood, affect, hygiene and grooming   EKG--no acute changes, same as prior EKG   ASSESSMENT/PLAN:  Fatigue, unspecified type - unclear etiology; had full labs 3 months ago which were normal. More acute symptoms now, so  repeat electrolytes, CBC - Plan: CBC with Differential/Platelet, Basic metabolic panel, EKG 136-[id number] Shortness of breath - normal exam, no e/o CHF on exam, or infection. Check CXR - Plan: CBC with Differential/Platelet, Basic metabolic panel, DG Chest 2 View, EKG 12-Lead  Chest pain, unspecified type - unclear etiology. r/o anemia, electrolyte abnormality, check CXR. EKG was unchanged - Plan: DG Chest 2 View, EKG 12-Lead  Mixed hyperlipidemia - encouraged to start crestor as previously recommended (now that her leg symptoms resolved). Risks of cardiovascular disease reviewed, due to high chol, tx rec   CXR, EKG, CBC, b-met  Discussed Ddx in detail--sounds as though allergies improved, and she denies that zyrtec causing fatigue. Denies snoring. There is no bleeding.  Leg swelling/discomfort resolved after taking her lasix. There is no e/o ongoing fluid retention/CHF,  nor any evidence to suggest PE. Will check labs and CXR. May need to f/u with cardiologist if ongoing DOE--discussed that her hyperlipidemia puts her at increased risk for cardiovascular disease, and encouraged to start the Crestor.   Start the crestor 64m for your cholesterol (since your leg aching has resolved). You will need to set up for the liver and cholesterol recheck in 2-3 months  [phone]

## 2018-01-16 ENCOUNTER — Ambulatory Visit (INDEPENDENT_AMBULATORY_CARE_PROVIDER_SITE_OTHER): Payer: PRIVATE HEALTH INSURANCE | Admitting: Family Medicine

## 2018-01-16 ENCOUNTER — Encounter: Payer: Self-pay | Admitting: Family Medicine

## 2018-01-16 ENCOUNTER — Ambulatory Visit
Admission: RE | Admit: 2018-01-16 | Discharge: 2018-01-16 | Disposition: A | Discharge status: Home / Self Care | Payer: PRIVATE HEALTH INSURANCE | Source: Ambulatory Visit | Attending: Family Medicine | Admitting: Family Medicine

## 2018-01-16 VITALS — BP 142/82 | HR 76 | Temp 98.2°F | Ht 66.0 in | Wt 193.8 lb

## 2018-01-16 DIAGNOSIS — R5383 Other fatigue: Secondary | ICD-10-CM

## 2018-01-16 DIAGNOSIS — R0602 Shortness of breath: Secondary | ICD-10-CM | POA: Diagnosis not present

## 2018-01-16 DIAGNOSIS — E782 Mixed hyperlipidemia: Secondary | ICD-10-CM | POA: Diagnosis not present

## 2018-01-16 DIAGNOSIS — R079 Chest pain, unspecified: Secondary | ICD-10-CM

## 2018-01-16 NOTE — Patient Instructions (Signed)
Go to [facility] ([address]) to get your chest x-ray. We will be in touch with your x-ray results and labs test by tomorrow (likely will have x-ray this evening; labs will be tomorrow).

## 2018-01-17 ENCOUNTER — Encounter: Payer: Self-pay | Admitting: Family Medicine

## 2018-01-17 LAB — CBC WITH DIFFERENTIAL/PLATELET
BASOS ABS: 0 10*3/uL (ref 0.0–0.2)
Basos: 0 %
EOS (ABSOLUTE): 0.1 10*3/uL (ref 0.0–0.4)
Eos: 1 %
HEMOGLOBIN: 14.2 g/dL (ref 11.1–15.9)
Hematocrit: 40.7 % (ref 34.0–46.6)
IMMATURE GRANULOCYTES: 0 %
Immature Grans (Abs): 0 10*3/uL (ref 0.0–0.1)
Lymphocytes Absolute: 2.5 10*3/uL (ref 0.7–3.1)
Lymphs: 40 %
MCH: 31.8 pg (ref 26.6–33.0)
MCHC: 34.9 g/dL (ref 31.5–35.7)
MCV: 91 fL (ref 79–97)
MONOCYTES: 6 %
Monocytes Absolute: 0.4 10*3/uL (ref 0.1–0.9)
NEUTROS PCT: 53 %
Neutrophils Absolute: 3.4 10*3/uL (ref 1.4–7.0)
PLATELETS: 314 10*3/uL (ref 150–450)
RBC: 4.47 x10E6/uL (ref 3.77–5.28)
RDW: 13 % (ref 12.3–15.4)
WBC: 6.3 10*3/uL (ref 3.4–10.8)

## 2018-01-17 LAB — BASIC METABOLIC PANEL
BUN/Creatinine Ratio: 11 (ref 9–23)
BUN: 9 mg/dL (ref 6–24)
CO2: 22 mmol/L (ref 20–29)
CREATININE: 0.81 mg/dL (ref 0.57–1.00)
Calcium: 10 mg/dL (ref 8.7–10.2)
Chloride: 103 mmol/L (ref 96–106)
GFR calc Af Amer: 97 mL/min/{1.73_m2} (ref >59)
GFR calc non Af Amer: 84 mL/min/{1.73_m2} (ref >59)
GLUCOSE: 81 mg/dL (ref 65–99)
Potassium: 4.3 mmol/L (ref 3.5–5.2)
SODIUM: 143 mmol/L (ref 134–144)

## 2018-01-21 ENCOUNTER — Ambulatory Visit
Admission: RE | Admit: 2018-01-21 | Discharge: 2018-01-21 | Disposition: A | Discharge status: Home / Self Care | Payer: PRIVATE HEALTH INSURANCE | Source: Ambulatory Visit | Attending: Obstetrics and Gynecology | Admitting: Obstetrics and Gynecology

## 2018-01-21 DIAGNOSIS — N63 Unspecified lump in unspecified breast: Secondary | ICD-10-CM

## 2018-01-21 DIAGNOSIS — R921 Mammographic calcification found on diagnostic imaging of breast: Secondary | ICD-10-CM | POA: Diagnosis not present

## 2018-01-21 DIAGNOSIS — N6489 Other specified disorders of breast: Secondary | ICD-10-CM | POA: Diagnosis not present

## 2018-02-15 ENCOUNTER — Emergency Department (HOSPITAL_COMMUNITY): Payer: PRIVATE HEALTH INSURANCE

## 2018-02-15 ENCOUNTER — Emergency Department (HOSPITAL_COMMUNITY)
Admission: EM | Admit: 2018-02-15 | Discharge: 2018-02-15 | Discharge status: Absent without leave | Payer: PRIVATE HEALTH INSURANCE

## 2018-02-15 ENCOUNTER — Other Ambulatory Visit: Payer: Self-pay

## 2018-02-15 ENCOUNTER — Encounter (HOSPITAL_COMMUNITY): Payer: Self-pay

## 2018-02-15 NOTE — ED Triage Notes (Deleted)
Pt states she fell down the stairs last night and injured left ankle. Pt states she misstepped going down the stairs. Pt denies LOC and hitting head. Pt ambulatory using 1 crutch in triage.

## 2018-07-04 ENCOUNTER — Other Ambulatory Visit: Payer: Self-pay | Admitting: *Deleted

## 2018-07-04 MED ORDER — POTASSIUM CHLORIDE CRYS ER 20 MEQ PO TBCR: 20.0000 meq | EXTENDED_RELEASE_TABLET | ORAL | 4 refills | Status: DC

## 2018-07-06 NOTE — Progress Notes (Deleted)
Olivia [name] is a 52 y.o. female who presents for a complete physical.  She sees Dr. Garwin Brothers for her GYN care. She has the following concerns:   She was last seen in June, with complaint of "feeling tired".  Sleeping more didn't help. She reported feeling tired even when sitting still, and gasping for air with activity, breathes heavy just even from showering.  She felt like a vacuum sucked all the life out of her--extreme fatigue. She thought it could have been allergies contributing at first. She resumed taking zyrtec (which she has taken in the past without causing fatigue), only takes 1/2 tablet daily, which didn't help. She did not have postnasal drainage, runny nose, chest congestion or cough. She described a "tiredness in her chest", and worried that it could be anemia causing these symptoms, or related to her heart since "chemo damaged my heart".  She admitted that she had forgotten to take her lasix when she went to visit her parents for a few days.  She had increased swelling, which improved after taking her lasix when she got home.  Felt similar to fluid overload she had in the past (shortness of breath and chest heaviness). Left leg was aching when the chest heaviness/fatigue first started, was worried about a blood clot.  Had been driving/traveling more.  Legs had been feeling better at time of her June visit.  Evaluation included normal CBC, b-met, CXR, EKG. She had TSH and Vitamin D levels done in March, which were normal.  Patient is under the care of Dr. Ellyn Hack for hypertensive heart disease and palpitations.  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   Hyperlipidemia: Crestor was prescribed in March, didn't start, then saw her cardiologist--recommended holding off on starting the Crestor until leg aching had resolved (was having prior to starting the statin). Suggested starting at 46m, titrating up if needed (he deferred to PCP for  management).  Lab Results  Component Value Date   CHOL 297 (H) 10/14/2017   HDL 47 10/14/2017   LDLCALC 217 (H) 10/14/2017   TRIG 166 (H) 10/14/2017   CHOLHDL 6.3 (H) 10/14/2017    Immunization History  Administered Date(s) Administered  . Td 07/30/2004  . Tdap 12/23/2011   Last Pap smear: per Dr. CGarwin BrothersLast mammogram: 12/2017 Last colonoscopy: 06/2009 with Dr. MCollene Mares  Biopsy was benign.  5 year f/u was recommended (per notation on path report, see scanned documents) Last DEXA: 12/2013 normal Dentist: Ophtho: Exercise: Vitamin D-OH normal at 46.3 in 09/2017  ROS:  The patient denies anorexia, fever, weight changes, headaches,  vision changes, decreased hearing, ear pain, sore throat, breast concerns, chest pain, palpitations, dizziness, syncope, dyspnea on exertion, cough, swelling, nausea, vomiting, diarrhea, constipation, abdominal pain, melena, hematochezia, indigestion/heartburn, hematuria, incontinence, dysuria, irregular menstrual cycles, vaginal discharge, odor or itch, genital lesions, joint pains, numbness, tingling, weakness, tremor, suspicious skin lesions, depression, anxiety, abnormal bleeding/bruising, or enlarged lymph nodes.  UPDATE_-menopausal??   PHYSICAL EXAM:  Wt Readings from Last 3 Encounters:  01/16/18 193 lb 12.8 oz (87.9 kg)  10/29/17 194 lb (88 kg)  10/14/17 190 lb 12.8 oz (86.5 kg)    General Appearance:    Alert, cooperative, no distress, appears stated age  Head:    Normocephalic, without obvious abnormality, atraumatic  Eyes:    PERRL, conjunctiva/corneas clear, EOM's intact, fundi    benign  Ears:    Normal TM's and external ear canals  Nose:   Nares normal, mucosa normal, no drainage or sinus   tenderness  Throat:   Lips, mucosa, and tongue normal; teeth and gums normal  Neck:   Supple, no lymphadenopathy;  thyroid:  no   enlargement/tenderness/nodules; no carotid   bruit or JVD  Back:    Spine nontender, no curvature, ROM normal, no CVA      tenderness  Lungs:     Clear to auscultation bilaterally without wheezes, rales or     ronchi; respirations unlabored  Chest Wall:    No tenderness or deformity   Heart:    Regular rate and rhythm, S1 and S2 normal, no murmur, rub   or gallop  Breast Exam:    Deferred to GYN  Abdomen:     Soft, non-tender, nondistended, normoactive bowel sounds,    no masses, no hepatosplenomegaly  Genitalia:    Deferred to GYN     Extremities:   No clubbing, cyanosis or edema  Pulses:   2+ and symmetric all extremities  Skin:   Skin color, texture, turgor normal, no rashes or lesions  Lymph nodes:   Cervical, supraclavicular, and axillary nodes normal  Neurologic:   CNII-XII intact, normal strength, sensation and gait; reflexes 2+ and symmetric throughout          Psych:   Normal mood, affect, hygiene and grooming.     ASSESSMENT/PLAN:  Discussed monthly self breast exams and yearly mammograms; at least 30 minutes of aerobic activity at least 5 days/week, weight-bearing exercise at least 2x/week; proper sunscreen use reviewed; healthy diet, including goals of calcium and vitamin D intake and alcohol recommendations (less than or equal to 1 drink/day) reviewed; regular seatbelt use; changing batteries in smoke detectors.  Immunization recommendations discussed--yearly flu shots are recommended.  Shingrix recommended--risks and side effects reviewed; to check insurance and schedule NV if/when desired. Colonoscopy recommendations reviewed--?past due per documentation in chart, to contact Dr. Collene Mares.

## 2018-07-07 ENCOUNTER — Telehealth: Payer: Self-pay | Admitting: *Deleted

## 2018-07-07 ENCOUNTER — Encounter: Payer: Self-pay | Admitting: Family Medicine

## 2018-07-07 NOTE — Telephone Encounter (Signed)
No-show letter and charge for missed physical.

## 2018-07-07 NOTE — Telephone Encounter (Signed)

## 2018-07-25 DIAGNOSIS — Z01419 Encounter for gynecological examination (general) (routine) without abnormal findings: Secondary | ICD-10-CM | POA: Diagnosis not present

## 2018-08-04 ENCOUNTER — Encounter (HOSPITAL_COMMUNITY): Payer: Self-pay | Admitting: Emergency Medicine

## 2018-08-04 ENCOUNTER — Encounter: Payer: Self-pay | Admitting: Family Medicine

## 2018-08-04 ENCOUNTER — Emergency Department (HOSPITAL_COMMUNITY)
Admission: EM | Admit: 2018-08-04 | Discharge: 2018-08-04 | Disposition: A | Discharge status: Home / Self Care | Payer: PRIVATE HEALTH INSURANCE | Attending: Emergency Medicine | Admitting: Emergency Medicine

## 2018-08-04 DIAGNOSIS — M542 Cervicalgia: Secondary | ICD-10-CM | POA: Insufficient documentation

## 2018-08-04 DIAGNOSIS — Z5321 Procedure and treatment not carried out due to patient leaving prior to being seen by health care provider: Secondary | ICD-10-CM | POA: Insufficient documentation

## 2018-08-04 NOTE — Telephone Encounter (Signed)
No show letter with fee sent. Back to Melissa to access fee.

## 2018-08-04 NOTE — ED Triage Notes (Addendum)
Pt c/o neck stiffness, headache that started Friday. C/o left arm tightness that started today. Pt reports that she has chronic pain and herniated disc in her neck and back.

## 2018-08-04 NOTE — ED Notes (Signed)
Called x1 for vitals reassessment. no answer.

## 2018-08-06 NOTE — Progress Notes (Signed)
Chief Complaint  Patient presents with  . Neck Pain    stiff neck and really bad headache. Started last Fri. Was worst Fri,Sat, Sun and Monday. Still hurts today but feels foggy. Hurts when she turns her head to the left. Lots of pressure in the back of her head, feels like something is going to burst-no facial pain or pressure. Used heat, ice and massage and helped but only some. Muscle relaxant and ibuprofen as well.    Patient presents with complaint of stiff neck and posterior headaches. Pain started 1/3--woke up with a stiff neck on the left side, and later spread down into her left shoulder. She had gone to the ER 1/6, but left prior to being seen. She had reported to triage nurse that left arm tightness started that day, 1/6. No longer has the tightness in her arm or pain in the shoulder.  She has h/o chronic neck pain with h/o herniated disc in neck/back (which has never been addressed by me). She reports being under the care of Dr. Brien Few.  We have no records from his office.   She states that her pain was the worst Fri through Sunday. Feels somewhat better now, but still not well. Headache is across the whole back of her head currently. Feels like a lot of pressure, "like something is going to pop".  Heat didn't help. Describes the posterior pain as heavy, tight, aching.  Pain is 7/10, after having taken 600mg  ibuprofen prior to coming here. She also reports taking nabumetone BID regularly for her back She reports taking ibuprofen every 4 hours.  Took 2 doses today.  Helps some, doesn't bother her stomach. Methocarbamol--uses at night. Used it twice during the day Friday through [facility] in bed, due to pain, and somewhat sleepy.  Med list reviewed, and meds from Dr. Brien Few (for back/neck pain) noted to be Lidocaine patches, robaxin,  relafen BID for her back Last saw Brien Few a couple of months ago   Chart reviewed.  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 Bilateral foraminal narrowing could affect either C6 nerve root. Minimal cord flattening without abnormal cord signal. Shallow central protrusion at C6-7 without impingement.  Asymmetric uncinate spurring at C3-4 on the left.  Left C4 nerve root impingement.  Back then was having left arm pain, intermittent HA. Headache is worse now.  Arm pain was bad, but got better, as reported above.   PMH, PSH, SH reviewed  Outpatient Encounter Medications as of 08/07/2018  Medication Sig Note  . ascorbic acid (VITAMIN C) 250 MG tablet Take 500 mg by mouth daily.   . Calcium Carbonate-Vitamin D (CALCIUM + D) 600-200 MG-UNIT TABS Take 2 tablets by mouth daily.     . cetirizine (ZYRTEC) 10 MG tablet Take 10 mg by mouth daily.   [name] diltiazem (CARDIZEM CD) 180 MG 24 hr capsule Take 1 capsule (180 mg total) by mouth daily.   . furosemide (LASIX) 40 MG tablet Take 1 tablet (40 mg total) by mouth daily. 10/14/2017: Takes every other day  . gabapentin (NEURONTIN) 600 MG tablet Take 600 mg by mouth at bedtime.   . lidocaine (LIDODERM) 5 %  06/10/2014: Received from: External Pharmacy  . lisinopril (PRINIVIL,ZESTRIL) 5 MG tablet Take 1 tablet (5 mg total) by mouth daily.   . methocarbamol (ROBAXIN) 500 MG tablet Take 500 mg by mouth as needed for muscle spasms.   . Multiple Vitamins-Minerals (MULTIVITAMIN WITH MINERALS) tablet Take 1  tablet by mouth daily.     . nabumetone (RELAFEN) 500 MG tablet Take 500 mg by mouth 2 (two) times daily.     [name] NEXIUM 40 MG capsule TAKE ONE CAPSULE BY MOUTH EVERY DAY 10/14/2017: Takes OTC Nexium daily now, not the 40mg  rx  . potassium chloride SA (KLOR-CON M20) 20 MEQ tablet Take 1 tablet (20 mEq total) by mouth every other day.   . vitamin E 400 UNIT capsule Take 400 Units by mouth daily.   . diphenhydrAMINE (BENADRYL) 25 MG tablet Take 25 mg by mouth every 6 (six) hours as needed.   . fluticasone (FLONASE) 50 MCG/ACT nasal spray PLACE 2 SPRAYS DAILY INTO BOTH NOSTRILS. (Patient  not taking: Reported on 08/07/2018)   . rosuvastatin (CRESTOR) 20 MG tablet Take 1 tablet (20 mg total) by mouth daily. (Patient not taking: Reported on 01/16/2018)    Facility-Administered Encounter Medications as of 08/07/2018  Medication  . methylPREDNISolone acetate (DEPO-MEDROL) injection 80 mg  . [COMPLETED] methylPREDNISolone acetate (DEPO-MEDROL) injection 80 mg   Allergies  Allergen Reactions  . Adhesive [Tape] Hives  . Morphine Hives   ROS:m No nausea, vomiting. Slight blurring of vision.  Brain feels a little slow, hard time processing--since neck started hurting on 1/3. Denies other neuro symptoms. No URI symptoms, other body aches.  No known fever or chills. No bleeding, bruising, rashes. Had some trembling in her left hand last night, (short-lived). No numbness/tingling. Denies any change in strength in either hand.   PHYSICAL EXAM:  BP (!) 140/94   Pulse 84   Temp 99.1 F (37.3 C) (Tympanic)   Ht 5\' 6"  (1.676 m)   Wt 202 lb (91.6 kg)   BMI 32.60 kg/m   Pleasant female, in mild discomfort but no acute distress. She seems somewhat anxious about the pain HEENT: conjunctiva and sclera are clear, fundi benign, EOMI. OP clear, sinuses nontender, nasal mucosa normal Neck: Supple, no lymphadenopathy, thyromegaly or mass, no bruit. No spinal tenderness. She is tender over the occiput bilaterally, extending into the paraspinous muscles with notable spasm, bilaterally. She has almost FROM--she has pain with looking to the left, with slight decreased ROM, otherwise FROM in all other direction.  Pain L neck with L ear to shoulder stretch but also with R ear to shoulder (pain on left) Neuro: alert and oriented, normal strength, sensation. DTR's diminished bilaterally and symmetric thoughout Psych: normal mood, affect, hygiene and grooming   ASSESSMENT/PLAN:  Neck pain - Plan: methylPREDNISolone acetate (DEPO-MEDROL) injection 80 mg, methylPREDNISolone acetate (DEPO-MEDROL)  injection 80 mg  Muscle spasm - Plan: methylPREDNISolone acetate (DEPO-MEDROL) injection 80 mg    We are giving you a steroid shot today. You may not need to take your nabumetone--back off on that and restart only if/when pain recurs. (since it is an anti-inflammatory just like the steroid). You should never take ibuprofen/advil/motrin/BC/Goody/naproxen or aleve while taking nabumetone. This can hurt your stomach and kidneys. acetaminophen (ie Tylenol) is perfectly fine to take in addition to all of these medications (muscle relaxant, steroid and/or nabumetone).  Continue heat, do stretches and massage as we discussed. Follow up with Dr. Brien Few if not improving. Physical therapy might be a good option.   DECLINES FLU SHOTS (encouraged)

## 2018-08-07 ENCOUNTER — Encounter: Payer: Self-pay | Admitting: Family Medicine

## 2018-08-07 ENCOUNTER — Ambulatory Visit (INDEPENDENT_AMBULATORY_CARE_PROVIDER_SITE_OTHER): Payer: PRIVATE HEALTH INSURANCE | Admitting: Family Medicine

## 2018-08-07 VITALS — BP 140/94 | HR 84 | Temp 99.1°F | Ht 66.0 in | Wt 202.0 lb

## 2018-08-07 DIAGNOSIS — M62838 Other muscle spasm: Secondary | ICD-10-CM | POA: Diagnosis not present

## 2018-08-07 DIAGNOSIS — M542 Cervicalgia: Secondary | ICD-10-CM

## 2018-08-07 MED ORDER — METHYLPREDNISOLONE ACETATE 80 MG/ML IJ SUSP: 80.0000 mg | Freq: Once | INTRAMUSCULAR | Status: DC

## 2018-08-07 MED ORDER — METHYLPREDNISOLONE ACETATE 80 MG/ML IJ SUSP
80.0000 mg | Freq: Once | INTRAMUSCULAR | Status: AC
  Administered 2018-08-07: 80 mg via INTRAMUSCULAR

## 2018-08-07 NOTE — Patient Instructions (Signed)
  We are giving you a steroid shot today. You may not need to take your nabumetone--back off on that and restart only if/when pain recurs. (since it is an anti-inflammatory just like the steroid). You should never take ibuprofen/advil/motrin/BC/Goody/naproxen or aleve while taking nabumetone. This can hurt your stomach and kidneys. acetaminophen (ie Tylenol) is perfectly fine to take in addition to all of these medications (muscle relaxant, steroid and/or nabumetone).  Continue heat, do stretches and massage as we discussed. Follow up with Dr. Brien Few if not improving. Physical therapy might be a good option.

## 2018-08-13 ENCOUNTER — Other Ambulatory Visit: Payer: Self-pay | Admitting: Obstetrics and Gynecology

## 2018-08-13 DIAGNOSIS — Z1231 Encounter for screening mammogram for malignant neoplasm of breast: Secondary | ICD-10-CM

## 2018-08-18 DIAGNOSIS — M5020 Other cervical disc displacement, unspecified cervical region: Secondary | ICD-10-CM | POA: Diagnosis not present

## 2018-08-18 DIAGNOSIS — M9981 Other biomechanical lesions of cervical region: Secondary | ICD-10-CM | POA: Diagnosis not present

## 2018-08-26 DIAGNOSIS — M542 Cervicalgia: Secondary | ICD-10-CM | POA: Diagnosis not present

## 2018-09-04 DIAGNOSIS — M5412 Radiculopathy, cervical region: Secondary | ICD-10-CM | POA: Diagnosis not present

## 2018-09-04 DIAGNOSIS — M5020 Other cervical disc displacement, unspecified cervical region: Secondary | ICD-10-CM | POA: Diagnosis not present

## 2018-09-04 DIAGNOSIS — M961 Postlaminectomy syndrome, not elsewhere classified: Secondary | ICD-10-CM | POA: Diagnosis not present

## 2018-09-04 DIAGNOSIS — M5416 Radiculopathy, lumbar region: Secondary | ICD-10-CM | POA: Diagnosis not present

## 2018-09-04 DIAGNOSIS — Z6833 Body mass index (BMI) 33.0-33.9, adult: Secondary | ICD-10-CM | POA: Diagnosis not present

## 2018-09-16 ENCOUNTER — Ambulatory Visit: Payer: PRIVATE HEALTH INSURANCE

## 2018-10-10 LAB — HM COLONOSCOPY

## 2018-10-15 ENCOUNTER — Other Ambulatory Visit: Payer: Self-pay

## 2018-10-15 ENCOUNTER — Ambulatory Visit
Admission: RE | Admit: 2018-10-15 | Discharge: 2018-10-15 | Disposition: A | Discharge status: Home / Self Care | Payer: PRIVATE HEALTH INSURANCE | Source: Ambulatory Visit | Attending: Obstetrics and Gynecology | Admitting: Obstetrics and Gynecology

## 2018-10-15 DIAGNOSIS — Z1231 Encounter for screening mammogram for malignant neoplasm of breast: Secondary | ICD-10-CM

## 2018-10-16 ENCOUNTER — Encounter: Payer: Self-pay | Admitting: *Deleted

## 2018-10-20 ENCOUNTER — Other Ambulatory Visit: Payer: Self-pay | Admitting: Cardiology

## 2018-11-01 ENCOUNTER — Other Ambulatory Visit: Payer: Self-pay | Admitting: Cardiology

## 2018-11-03 NOTE — Telephone Encounter (Signed)
Diltiazem refilled. 

## 2018-11-13 ENCOUNTER — Telehealth: Payer: Self-pay | Admitting: *Deleted

## 2018-11-13 NOTE — Telephone Encounter (Signed)
Left message to call back - need to schedule  televisit w/dr harding 4/21 earlier in the day

## 2018-11-17 ENCOUNTER — Telehealth: Payer: Self-pay | Admitting: Cardiology

## 2018-11-17 NOTE — Telephone Encounter (Signed)
Called x3 for pre reg °

## 2018-11-18 ENCOUNTER — Ambulatory Visit: Payer: PRIVATE HEALTH INSURANCE | Admitting: Cardiology

## 2018-11-18 ENCOUNTER — Telehealth (INDEPENDENT_AMBULATORY_CARE_PROVIDER_SITE_OTHER): Payer: PRIVATE HEALTH INSURANCE | Admitting: Cardiology

## 2018-11-18 ENCOUNTER — Telehealth: Payer: Self-pay | Admitting: *Deleted

## 2018-11-18 ENCOUNTER — Encounter: Payer: Self-pay | Admitting: Cardiology

## 2018-11-18 DIAGNOSIS — I10 Essential (primary) hypertension: Secondary | ICD-10-CM

## 2018-11-18 DIAGNOSIS — R002 Palpitations: Secondary | ICD-10-CM

## 2018-11-18 DIAGNOSIS — R0602 Shortness of breath: Secondary | ICD-10-CM

## 2018-11-18 DIAGNOSIS — I5032 Chronic diastolic (congestive) heart failure: Secondary | ICD-10-CM

## 2018-11-18 DIAGNOSIS — R600 Localized edema: Secondary | ICD-10-CM

## 2018-11-18 DIAGNOSIS — E785 Hyperlipidemia, unspecified: Secondary | ICD-10-CM

## 2018-11-18 DIAGNOSIS — I110 Hypertensive heart disease with heart failure: Secondary | ICD-10-CM

## 2018-11-18 MED ORDER — DILTIAZEM HCL ER COATED BEADS 180 MG PO CP24: ORAL_CAPSULE | ORAL | 3 refills | Status: DC

## 2018-11-18 MED ORDER — POTASSIUM CHLORIDE CRYS ER 20 MEQ PO TBCR: 20.0000 meq | EXTENDED_RELEASE_TABLET | ORAL | 3 refills | Status: DC

## 2018-11-18 MED ORDER — LISINOPRIL 5 MG PO TABS: ORAL_TABLET | ORAL | 3 refills | Status: DC

## 2018-11-18 MED ORDER — FUROSEMIDE 40 MG PO TABS: 40.0000 mg | ORAL_TABLET | ORAL | 3 refills | Status: DC

## 2018-11-18 MED ORDER — ROSUVASTATIN CALCIUM 40 MG PO TABS: 40.0000 mg | ORAL_TABLET | Freq: Every day | ORAL | 3 refills | Status: DC

## 2018-11-18 NOTE — Assessment & Plan Note (Signed)
No acute CHF symptoms.  She has a murmur orthopnea but is probably more related to her obesity.  Exertional dyspnea probably not related to heart failure and that she does not really have other true heart failure symptoms.  She has chronic swelling which I think is probably more related to venous stasis then CHF in the absence of any PND type symptoms.  She is on an ACE inhibitor and diltiazem along with intermittent dosing of Lasix.

## 2018-11-18 NOTE — Progress Notes (Signed)
Virtual Visit via Video Note   This visit type was conducted due to national recommendations for restrictions regarding the COVID-19 Pandemic (e.g. social distancing) in an effort to limit this patient's exposure and mitigate transmission in our community.  Due to her co-morbid illnesses, this patient is at least at moderate risk for complications without adequate follow up.  This format is felt to be most appropriate for this patient at this time.  All issues noted in this document were discussed and addressed.  A limited physical exam was performed with this format.  Please refer to the patient's chart for her consent to telehealth for [facility].   Patient has given verbal permission to conduct this visit via virtual appointment and to bill insurance 11/14/2018 1:51 PM     Evaluation Performed:  Follow-up visit  Date:  11/18/2018   ID:  Olivia [name], DOB [birth year], MRN [id number]  Patient Location: Home Provider Location: Home  PCP:  Rita Ohara, MD  Cardiologist:  Glenetta Hew, MD Electrophysiologist:  None   Chief Complaint: Annual cardiology follow-up  History of Present Illness:    Olivia [name] is a 53 y.o. 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  Olivia [name] presents via Engineer, civil (consulting) for a telehealth visit today for annual follow-up.  Olivia [name] was last seen in April 2019.  Doing well from a cardiac standpoint.  She did well after treatment of costochondritis with a steroid taper.  Not really having that much in the way of any irregular heart palpitations -doing well with diltiazem.  No chest pain or CHF symptoms.  Was taking Lasix every other day. --Started on Crestor (NOT TAKING) -apparently she did not get the prescription.  Interval History:  Doing relatively well.  No major complaints.  The most notable complaint is that she will has that  she does have right greater than left leg swelling and that that leg from the knee down mostly in the back of her calf she does have pain worse with standing but also occurs with walking.  Not made worse with walking however. She does sleep with a pillow somewhat raised, but does not necessarily describe true orthopnea.  No PND.  Otherwise she is been doing fine from a cardiac standpoint with no chest tightness or pressure with rest or exertion.  No palpitations.  Cardiovascular ROS: positive for - dyspnea on exertion, edema, orthopnea and R > L leg swelling - better in Am; sleeps with pillow up a bit negative for - chest pain, irregular heartbeat, loss of consciousness, palpitations, paroxysmal nocturnal dyspnea, rapid heart rate, shortness of breath or syncope/ near syncope TIA/amaurosis fugax  The patient does not have symptoms concerning for COVID-19 infection (fever, chills, cough, or new shortness of breath).  The patient is practicing social distancing.  ROS:  Please see the history of present illness.    Review of Systems  Constitutional: Negative for malaise/fatigue.  HENT: Negative for congestion.   Respiratory: Negative for cough, shortness of breath and wheezing.   Gastrointestinal: Negative for blood in stool, diarrhea, melena, nausea and vomiting.  Genitourinary: Negative for dysuria and hematuria.  Musculoskeletal: Positive for joint pain and myalgias (R > L lower leg - with standing & walking - worse @ end of the day.).  Neurological: Negative for dizziness, focal weakness and weakness.  All other systems reviewed and are  negative.   Past Medical History:  Diagnosis Date  . Breast cancer, stage 1 ([place])   . Colon polyp   . GERD (gastroesophageal reflux disease)   . H/O diastolic dysfunction 96/2836   Grade 2 DD. 1.3 x 1.3 cm mass in RA on Echo -->Cardiac MRI 12/04: 1.9 x 1.6 cm mass in the lateral RA cavity. -- Most suggestive of thrombus--  calcified and  lamina and ted  suggestive of chronic.; EF from 50% with no scar.  [name] History of breast cancer 2010   invasive ductal R breast; s/p lumpectomy, radiation and chemo  . Lumbar degenerative disc disease   . Obesity   . Personal history of chemotherapy   . Personal history of radiation therapy   . 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  [name] Uterine fibroid    s/p embolization   Past Surgical History:  Procedure Laterality Date  . BREAST BIOPSY Right 12/31/2012  . BREAST BIOPSY Right 09/06/2008  . BREAST BIOPSY Right 08/11/2008  . BREAST LUMPECTOMY  2010   right  . Cardiac MRI  December 2014   1.9 x 1.6 cm mass in the lateral RA cavity. -- Most suggestive of thrombus--  calcified anndd  laammiinnaated suggestive of chronic.; EF from 50-4% with no scar. Trivial effusion  . COLONOSCOPY  2010  . LUMBAR LAMINECTOMY/DECOMPRESSION MICRODISCECTOMY  2007, 2008   Dr. Hal Neer x 2  . POWER PORT    . TRANSTHORACIC ECHOCARDIOGRAM  November 2014; March 2016t   a) Mild LVH, EF of roughly 50%. Pseudo-normal LV filling (grade 3 diastolic soft. Mild LA dilation. 1.3 x 1.3 cm mass in right atrium.;; b) Low normal LV function; grade 2 diastolic dysfunction; calcified right atrial mass; no change compared to 06/11/13.  . TUBAL LIGATION  2010  . UTERINE FIBROID EMBOLIZATION  2008 or 2009     Current Meds  Medication Sig  . ascorbic acid (VITAMIN C) 250 MG tablet Take 500 mg by mouth daily.  . Calcium Carbonate-Vitamin D (CALCIUM + D) 600-200 MG-UNIT TABS Take 2 tablets by mouth daily.    [name] diltiazem (CARDIZEM CD) 180 MG 24 hr capsule TAKE 1 CAPSULE(180 MG) BY MOUTH DAILY.....ONLY WANTS OCEANSIDE GENERIC  . esomeprazole (NEXIUM 24HR) 20 MG capsule Take 20 mg by mouth daily at 12 noon.  . fluticasone (FLONASE) 50 MCG/ACT nasal spray Place 2 sprays into both nostrils daily as needed for allergies or  rhinitis.  . furosemide (LASIX) 40 MG tablet Take 1 tablet (40 mg total) by mouth every other day.  . gabapentin (NEURONTIN) 600 MG tablet Take 600 mg by mouth at bedtime.  . lidocaine (LIDODERM) 5 % Place 3 patches onto the skin daily.   [name] lisinopril (ZESTRIL) 5 MG tablet TAKE 1 TABLET(5 MG) BY MOUTH DAILY  . methocarbamol (ROBAXIN) 500 MG tablet Take 500 mg by mouth as needed for muscle spasms.  . Multiple Vitamins-Minerals (MULTIVITAMIN WITH MINERALS) tablet Take 1 tablet by mouth daily.    . nabumetone (RELAFEN) 500 MG tablet Take 500 mg by mouth 2 (two) times daily.    . potassium chloride SA (KLOR-CON M20) 20 MEQ tablet Take 1 tablet (20 mEq total) by mouth every other day.  . vitamin E 400 UNIT capsule Take 400 Units by mouth daily.  . [DISCONTINUED] diltiazem (CARDIZEM CD) 180 MG 24 hr capsule TAKE 1 CAPSULE(180 MG) BY MOUTH DAILY.....ONLY  WANTS OCEANSIDE GENERIC  . [DISCONTINUED] furosemide (LASIX) 40 MG tablet Take 40 mg by mouth every other day.  . [DISCONTINUED] lisinopril (PRINIVIL,ZESTRIL) 5 MG tablet TAKE 1 TABLET(5 MG) BY MOUTH DAILY  . [DISCONTINUED] potassium chloride SA (KLOR-CON M20) 20 MEQ tablet Take 1 tablet (20 mEq total) by mouth every other day.     Allergies:   Adhesive [tape] and Morphine   Social History   Tobacco Use  . Smoking status: Never Smoker  . Smokeless tobacco: Never Used  Substance Use Topics  . Alcohol use: No  . Drug use: No     Family Hx: The patient's family history includes Breast cancer in her mother; Cancer in her maternal grandmother.   Prior CV studies:   The following studies were reviewed today: . None  Labs/Other Tests and Data Reviewed:    EKG:  No ECG reviewed.  Recent Labs: 01/16/2018: BUN 9; Creatinine, Ser 0.81; Hemoglobin 14.2; Platelets 314; Potassium 4.3; Sodium 143   Recent Lipid Panel Lab Results  Component Value Date/Time   CHOL 297 (H) 10/14/2017 12:08 PM   TRIG 166 (H) 10/14/2017 12:08 PM   HDL 47  10/14/2017 12:08 PM   CHOLHDL 6.3 (H) 10/14/2017 12:08 PM   CHOLHDL 5.7 08/22/2011 12:00 AM   LDLCALC 217 (H) 10/14/2017 12:08 PM    Wt Readings from Last 3 Encounters:  08/07/18 202 lb (91.6 kg)  08/04/18 200 lb (90.7 kg)  01/16/18 193 lb 12.8 oz (87.9 kg)     Objective:    Vital Signs:  There were no vitals taken for this visit.  -- BP has been OK (not checked today); Wgt [id number]. VITAL SIGNS:  reviewed GEN:  Well nourished, well developed female in no acute distress. RESPIRATORY:  normal respiratory effort, symmetric expansion MUSCULOSKELETAL:  no obvious deformities. NEURO:  alert and oriented x 3, no obvious focal deficit PSYCH:  normal affect   ASSESSMENT & PLAN:    Problem List Items Addressed This Visit    Bilateral lower extremity edema (Chronic)   Essential hypertension, benign - Primary (Chronic)    She says her blood pressures been doing pretty well at home.  Would like to see what they are running, but unfortunately will have levels for today.  She says routinely they have been in the "normal range ".  For now I would not make any adjustments but would defer to PCP.      Relevant Medications   furosemide (LASIX) 40 MG tablet   diltiazem (CARDIZEM CD) 180 MG 24 hr capsule   lisinopril (ZESTRIL) 5 MG tablet   rosuvastatin (CRESTOR) 40 MG tablet   Other Relevant Orders   Lipid panel   Comprehensive metabolic panel   Hyperlipidemia with target LDL less than 100 (Chronic)    Lipids are well out of control as of last March, and she never did start rosuvastatin.  It seems like her leg pain is probably more related to venous stasis type discomfort and not necessarily myalgia or claudication.  Plan: Recheck lipids now with chemistry panel.  Rosuvastatin 40 mg p.o. daily--start with 2 weeks of tablet dail.,  Then increase to full dose.  Recheck lipids in 4 months  Continue to work on diet and exercise      Relevant Medications   furosemide (LASIX) 40 MG  tablet   diltiazem (CARDIZEM CD) 180 MG 24 hr capsule   lisinopril (ZESTRIL) 5 MG tablet   rosuvastatin (CRESTOR) 40 MG tablet   Other Relevant Orders  Lipid panel   Comprehensive metabolic panel   Hypertensive heart disease - diastolic dysfunction noted on echo (Chronic)    No acute CHF symptoms.  She has a murmur orthopnea but is probably more related to her obesity.  Exertional dyspnea probably not related to heart failure and that she does not really have other true heart failure symptoms.  She has chronic swelling which I think is probably more related to venous stasis then CHF in the absence of any PND type symptoms.  She is on an ACE inhibitor and diltiazem along with intermittent dosing of Lasix.      Relevant Medications   furosemide (LASIX) 40 MG tablet   diltiazem (CARDIZEM CD) 180 MG 24 hr capsule   lisinopril (ZESTRIL) 5 MG tablet   rosuvastatin (CRESTOR) 40 MG tablet   Other Relevant Orders   Comprehensive metabolic panel   Palpitations (Chronic)    Well-controlled on current dose of diltiazem.  No change.      SOB (shortness of breath) on exertion    At this point, I think it is probably exertional dyspnea more related to deconditioning and obesity. Encouraged her to continue to work on diet and exercise. She seems to be quite sedentary, especially now with her being back in school.  I encouraged her to get outside and walk.  Continue walking on her treadmill.         COVID-19 Education: The signs and symptoms of COVID-19 were discussed with the patient and how to seek care for testing (follow up with PCP or arrange E-visit).   The importance of social distancing was discussed today.  Time:   Today, I have spent ~20 minutes with the patient with telehealth technology discussing the above problems.  5-20 min additional time with chart review & charting.   Medication Adjustments/Labs and Tests Ordered: Current medicines are reviewed at length with the patient  today.  Concerns regarding medicines are outlined above.   Patient instructions:   As I discussed, which usually.  His weight. For leg swelling, allergy to try to take time during the day to raise your feet up to at least 8 hip if not above your heart level for may be 20 to 30 minutes at a time.  But otherwise if sitting for long period time do foot exercises moving her feet up and down to try to keep blood circulated.   Would also recommend wearing over-the-counter support stockings -can be purchased at pharmacies or [facility].  Medication Instructions:   I do want you to start rosuvastatin --we will start off with 1/2 tablet a day for 2 weeks and then increase to full tablet.  We will refill other cardiac medicines  Tests Ordered: Orders Placed This Encounter  Procedures  . Lipid panel  . Comprehensive metabolic panel  We will check chemistry panel and lipid panel now and then again in roughly 4 months to reevaluate cholesterol levels.  Medication Changes: Meds ordered this encounter  Medications  . furosemide (LASIX) 40 MG tablet    Sig: Take 1 tablet (40 mg total) by mouth every other day.    Dispense:  30 tablet    Refill:  3  . potassium chloride SA (KLOR-CON M20) 20 MEQ tablet    Sig: Take 1 tablet (20 mEq total) by mouth every other day.    Dispense:  90 tablet    Refill:  3  . diltiazem (CARDIZEM CD) 180 MG 24 hr capsule    Sig: TAKE 1  CAPSULE(180 MG) BY MOUTH DAILY.....ONLY WANTS OCEANSIDE GENERIC    Dispense:  90 capsule    Refill:  3  . lisinopril (ZESTRIL) 5 MG tablet    Sig: TAKE 1 TABLET(5 MG) BY MOUTH DAILY    Dispense:  3090 tablet    Refill:  3  . rosuvastatin (CRESTOR) 40 MG tablet    Sig: Take 1 tablet (40 mg total) by mouth daily.    Dispense:  90 tablet    Refill:  3  New Rx: Rosuvastatin 40 mg.  1 tablet p.o. daily (as directed). Disp: 90 tab.  3 Refills  Refill: Current prescriptions for diltiazem, lisinopril, furosemide, Klor-Con  Disposition:   Follow up in 1 year(s)    Signed, Glenetta Hew, MD  11/18/2018 2:20 PM    [facility]

## 2018-11-18 NOTE — Assessment & Plan Note (Signed)
Well-controlled on current dose of diltiazem.  No change.

## 2018-11-18 NOTE — Telephone Encounter (Signed)
LATE ENTRY  RN LEFT MESSAGE   OF AVS SUMMARY ANY QUESTION MAY CALL BACK . AVS SUMMARY WILL BE RECEIVED VIA MYCHART.

## 2018-11-18 NOTE — Assessment & Plan Note (Signed)
Lipids are well out of control as of last March, and she never did start rosuvastatin.  It seems like her leg pain is probably more related to venous stasis type discomfort and not necessarily myalgia or claudication.  Plan: Recheck lipids now with chemistry panel.  Rosuvastatin 40 mg p.o. daily--start with 2 weeks of tablet dail.,  Then increase to full dose.  Recheck lipids in 4 months  Continue to work on diet and exercise

## 2018-11-18 NOTE — Patient Instructions (Addendum)
  Patient instructions:   As I discussed, which usually.  His weight. For leg swelling, allergy to try to take time during the day to raise your feet up to at least 8 hip if not above your heart level for may be 20 to 30 minutes at a time.  But otherwise if sitting for long period time do foot exercises moving her feet up and down to try to keep blood circulated.   Would also recommend wearing over-the-counter support stockings -can be purchased at pharmacies or [facility].  Medication Instructions:   I do want you to start rosuvastatin --we will start off with 1/2 tablet a day for 2 weeks and then increase to full tablet..  New Rx: Rosuvastatin 40 mg.  1 tablet p.o. daily (as directed). Disp: 90 tab.  3 Refills  We will refill other cardiac medicines: Current prescriptions for diltiazem, lisinopril, furosemide, Klor-Con   If you need a refill on your cardiac medications before your next appointment, please call your pharmacy.   Lab work:  We will check chemistry panel and lipid panel (CMP, FLP) now and then again in roughly 4 months to reevaluate cholesterol levels. Will mail labslip closer to Aug 2020   If you have labs (blood work) drawn today and your tests are completely normal, you will receive your results only by: [name] MyChart Message (if you have MyChart) OR . A paper copy in the mail If you have any lab test that is abnormal or we need to change your treatment, we will call you to review the results.  Testing/Procedures:  Not needed  Follow-Up: At [facility], you and your health needs are our priority.  As part of our continuing mission to provide you with exceptional heart care, we have created designated Provider Care Teams.  These Care Teams include your primary Cardiologist (physician) and Advanced Practice Providers (APPs -  Physician Assistants and Nurse Practitioners) who all work together to provide you with the care you need, when you need it.  You will need a  follow up appointment in APRIL 2021 .  Please call our office 2 months in advance to schedule this appointment.  You may see Glenetta Hew ,MD or one of the following Advanced Practice Providers on your designated Care Team:   Rosaria Ferries, PA-C . Jory Sims, DNP, ANP  Any Other Special Instructions Will Be Listed Below.  As I discussed, which usually.  His weight. For leg swelling, allergy to try to take time during the day to raise your feet up to at least 8 hip if not above your heart level for may be 20 to 30 minutes at a time.  But otherwise if sitting for long period time do foot exercises moving her feet up and down to try to keep blood circulated.   Would also recommend wearing over-the-counter support stockings -can be purchased at pharmacies or [facility].

## 2018-11-18 NOTE — Assessment & Plan Note (Signed)
At this point, I think it is probably exertional dyspnea more related to deconditioning and obesity. Encouraged her to continue to work on diet and exercise. She seems to be quite sedentary, especially now with her being back in school.  I encouraged her to get outside and walk.  Continue walking on her treadmill.

## 2018-11-18 NOTE — Assessment & Plan Note (Signed)
She says her blood pressures been doing pretty well at home.  Would like to see what they are running, but unfortunately will have levels for today.  She says routinely they have been in the "normal range ".  For now I would not make any adjustments but would defer to PCP.

## 2018-11-27 ENCOUNTER — Telehealth: Payer: Self-pay | Admitting: *Deleted

## 2018-11-27 NOTE — Telephone Encounter (Signed)
Patient advised.

## 2018-11-27 NOTE — Telephone Encounter (Signed)
Patient called and she has been nauseous since Sat. No diarrhea or vomiting, no fever. She will start feeling better and then nausea will come right back. She has tried pepto, pedialyte, ginger ale and crackers over the past few days. Nothing helps. Please advise. Thanks.

## 2018-11-27 NOTE — Telephone Encounter (Signed)
Have her try regular dosing of the [facility] and if that does not work have her come in for an appointment

## 2018-12-18 ENCOUNTER — Ambulatory Visit (INDEPENDENT_AMBULATORY_CARE_PROVIDER_SITE_OTHER): Payer: PRIVATE HEALTH INSURANCE | Admitting: Family Medicine

## 2018-12-18 ENCOUNTER — Encounter: Payer: Self-pay | Admitting: Family Medicine

## 2018-12-18 ENCOUNTER — Other Ambulatory Visit: Payer: Self-pay

## 2018-12-18 VITALS — Wt 195.0 lb

## 2018-12-18 DIAGNOSIS — R350 Frequency of micturition: Secondary | ICD-10-CM | POA: Diagnosis not present

## 2018-12-18 DIAGNOSIS — N3000 Acute cystitis without hematuria: Secondary | ICD-10-CM

## 2018-12-18 DIAGNOSIS — R300 Dysuria: Secondary | ICD-10-CM | POA: Diagnosis not present

## 2018-12-18 MED ORDER — NITROFURANTOIN MONOHYD MACRO 100 MG PO CAPS: 100.0000 mg | ORAL_CAPSULE | Freq: Two times a day (BID) | ORAL | 0 refills | Status: DC

## 2018-12-18 NOTE — Progress Notes (Signed)
Documentation for virtual audio and video telecommunications through [place] encounter:  The patient was located at home. 2 patient identifiers used.  The provider was located in the office. The patient did consent to this visit and is aware of possible charges through their insurance for this visit.  The other persons participating in this telemedicine service were none.   Subjective:  Olivia [name] is a 53 y.o. female who complains of possible urinary tract infection.  She has had symptoms for 6 days.  Symptoms include urinary frequency, urgency, dysuria, dark and cloudy urine withsharp pain in left side. Patient denies fever, chills, abdominal pain, N/V/D, vaginal discharge.   Last UTI was years ago.   Using AZO maxiumum for current symptoms.    Patient does not have a history of recurrent UTI. Patient does not have a history of pyelonephritis.  No other aggravating or relieving factors.  No other c/o.  Past Medical History:  Diagnosis Date  . Breast cancer, stage 1 ([place])   . Colon polyp   . GERD (gastroesophageal reflux disease)   . H/O diastolic dysfunction 12/2692   Grade 2 DD. 1.3 x 1.3 cm mass in RA on Echo -->Cardiac MRI 12/04: 1.9 x 1.6 cm mass in the lateral RA cavity. -- Most suggestive of thrombus--  calcified and  lamina and ted suggestive of chronic.; EF from 50% with no scar.  [name] History of breast cancer 2010   invasive ductal R breast; s/p lumpectomy, radiation and chemo  . Lumbar degenerative disc disease   . Obesity   . Personal history of chemotherapy   . Personal history of radiation therapy   . 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  [name] Uterine fibroid    s/p embolization    ROS as in subjective  Reviewed allergies, medications, past medical, surgical, and social history.    Objective: There were no vitals filed for this  visit.  General appearance: alert, no distress, WD/WN, female Unable to perform physical exam due to virtual visit  Laboratory:  Urine dipstick: not done.       Assessment: Acute cystitis without hematuria - Plan: nitrofurantoin, macrocrystal-monohydrate, (MACROBID) 100 MG capsule  Urinary frequency  Dysuria   Plan: Discussed symptoms, diagnosis, possible complications, and usual course of illness.  Macrobid prescribed.   Advised increased water intake, can use OTC Tylenol for pain.    Advised to increase water intake. May use AZO for 1-2 days if needed and then stop.    Urine culture was not sent.    Call or return if worse or not improving.    Time spent on call was 15 minutes and in review of previous records 2 minutes total.  This virtual service is not related to other E/M service within previous 7 days.

## 2018-12-23 ENCOUNTER — Telehealth: Payer: Self-pay | Admitting: Cardiology

## 2018-12-23 NOTE — Telephone Encounter (Signed)
Left message to call back  

## 2018-12-23 NOTE — Telephone Encounter (Signed)
  Pt c/o medication issue:  1. Name of Medication: rosuvastatin (CRESTOR) 40 MG tablet  2. How are you currently taking this medication (dosage and times per day)? Started out with half a pill and then moved to a whole pill as directed by dr  3. Are you having a reaction (difficulty breathing--STAT)?  Difficulty breathing, really tired  4. What is your medication issue? She feels really tired and like she can't get her breath, like she is winded. She went back to taking the 1/2 pill and that helped some but she wants to know if she can try something else.

## 2018-12-24 NOTE — Telephone Encounter (Signed)
Spoke with pt and has been taking Crestor 40 mg for approx 2 weeks and noted SOB so decreased to 20 mg and symptoms have improved. Pt would like to try another med. Will forward to Dr Ellyn Hack for review .Adonis Housekeeper

## 2018-12-24 NOTE — Telephone Encounter (Signed)
These are not symptoms that are usual side effects of Crestor -- lets continue with 1/2 tab for now. Would prefer not to change.  Glenetta Hew, MD

## 2018-12-24 NOTE — Telephone Encounter (Signed)
Pt aware of recommendations and agrees with plan ./cy 

## 2018-12-24 NOTE — Telephone Encounter (Signed)
Lm to call back ./cy 

## 2019-01-16 ENCOUNTER — Other Ambulatory Visit: Payer: Self-pay | Admitting: Cardiology

## 2019-01-26 ENCOUNTER — Other Ambulatory Visit: Payer: Self-pay

## 2019-01-26 ENCOUNTER — Ambulatory Visit
Admission: RE | Admit: 2019-01-26 | Discharge: 2019-01-26 | Disposition: A | Discharge status: Home / Self Care | Payer: PRIVATE HEALTH INSURANCE | Source: Ambulatory Visit | Attending: Obstetrics and Gynecology | Admitting: Obstetrics and Gynecology

## 2019-01-27 NOTE — Telephone Encounter (Signed)
Open n error °

## 2019-02-02 ENCOUNTER — Other Ambulatory Visit: Payer: Self-pay | Admitting: Cardiology

## 2019-02-02 NOTE — Telephone Encounter (Signed)
Rx(s) sent to pharmacy electronically.  

## 2019-03-06 ENCOUNTER — Other Ambulatory Visit: Payer: Self-pay | Admitting: *Deleted

## 2019-03-06 ENCOUNTER — Telehealth: Payer: Self-pay | Admitting: *Deleted

## 2019-03-06 DIAGNOSIS — I10 Essential (primary) hypertension: Secondary | ICD-10-CM

## 2019-03-06 DIAGNOSIS — E785 Hyperlipidemia, unspecified: Secondary | ICD-10-CM

## 2019-03-06 DIAGNOSIS — I110 Hypertensive heart disease with heart failure: Secondary | ICD-10-CM

## 2019-03-06 NOTE — Telephone Encounter (Signed)
Mailed labslip  And letter.

## 2019-03-06 NOTE — Telephone Encounter (Signed)
-----   Message from Raiford Simmonds, RN sent at 11/18/2018 12:51 PM EDT ----- Mail letter and labslip  @ July 21 , 2020  Due cmp lipid aug 21,2020

## 2019-03-18 DIAGNOSIS — M5416 Radiculopathy, lumbar region: Secondary | ICD-10-CM | POA: Diagnosis not present

## 2019-07-02 ENCOUNTER — Other Ambulatory Visit: Payer: Self-pay | Admitting: Cardiology

## 2019-08-02 ENCOUNTER — Other Ambulatory Visit: Payer: Self-pay | Admitting: Cardiology

## 2019-08-11 DIAGNOSIS — R87810 Cervical high risk human papillomavirus (HPV) DNA test positive: Secondary | ICD-10-CM | POA: Diagnosis not present

## 2019-10-30 ENCOUNTER — Other Ambulatory Visit: Payer: Self-pay | Admitting: Cardiology

## 2019-11-20 ENCOUNTER — Other Ambulatory Visit: Payer: Self-pay | Admitting: Cardiology

## 2019-11-23 ENCOUNTER — Encounter: Payer: Self-pay | Admitting: Cardiology

## 2019-11-23 ENCOUNTER — Telehealth: Payer: Self-pay | Admitting: *Deleted

## 2019-11-23 ENCOUNTER — Other Ambulatory Visit: Payer: Self-pay

## 2019-11-23 ENCOUNTER — Ambulatory Visit (INDEPENDENT_AMBULATORY_CARE_PROVIDER_SITE_OTHER): Payer: Commercial Managed Care - PPO | Admitting: Cardiology

## 2019-11-23 VITALS — BP 140/80 | HR 74 | Temp 96.4°F | Ht 65.0 in | Wt 189.4 lb

## 2019-11-23 DIAGNOSIS — I5032 Chronic diastolic (congestive) heart failure: Secondary | ICD-10-CM

## 2019-11-23 DIAGNOSIS — E785 Hyperlipidemia, unspecified: Secondary | ICD-10-CM | POA: Diagnosis not present

## 2019-11-23 DIAGNOSIS — I110 Hypertensive heart disease with heart failure: Secondary | ICD-10-CM

## 2019-11-23 DIAGNOSIS — I513 Intracardiac thrombosis, not elsewhere classified: Secondary | ICD-10-CM | POA: Diagnosis not present

## 2019-11-23 DIAGNOSIS — R931 Abnormal findings on diagnostic imaging of heart and coronary circulation: Secondary | ICD-10-CM

## 2019-11-23 DIAGNOSIS — I10 Essential (primary) hypertension: Secondary | ICD-10-CM

## 2019-11-23 DIAGNOSIS — R002 Palpitations: Secondary | ICD-10-CM | POA: Diagnosis not present

## 2019-11-23 DIAGNOSIS — R0602 Shortness of breath: Secondary | ICD-10-CM

## 2019-11-23 NOTE — Patient Instructions (Signed)
Medication Instructions:  *no changes  *If you need a refill on your cardiac medications before your next appointment, please call your pharmacy*   Lab Work: Will try to obtain labs from [facility]   Testing/Procedures: Not needed   Follow-Up: At [facility], you and your health needs are our priority.  As part of our continuing mission to provide you with exceptional heart care, we have created designated Provider Care Teams.  These Care Teams include your primary Cardiologist (physician) and Advanced Practice Providers (APPs -  Physician Assistants and Nurse Practitioners) who all work together to provide you with the care you need, when you need it.   Your next appointment:   12 month(s)  The format for your next appointment:   In Person  Provider:   Glenetta Hew, MD   Other Instructions n/a

## 2019-11-23 NOTE — Progress Notes (Signed)
Primary Care Provider: Rita Ohara, MD Cardiologist: Glenetta Hew, MD Electrophysiologist: None  Clinic Note: Chief Complaint  Patient presents with  . Follow-up    Stable.  Doing well.  No idea    HPI:    Olivia [name] is a 54 y.o. female with a h/o RA Thrombus (associated with PICC Line) along with HTN Heart Disease who presents today for annual follow-up.  [name] was last seen on November 18, 2018 via telemedicine.  No major complaints right greater than left leg swelling from the knee down in the back of the calf.  She was not taking Crestor.  Recent Hospitalizations: None  Reviewed  CV studies:    The following studies were reviewed today: (if available, images/films reviewed: From Epic Chart or Care Everywhere) . None:  Interval History:   Olivia [name] returns today overall doing fairly well.  She said she still has swelling in her left leg but is definitely better.  Is off and on.  While this is related to her back surgery where she has neuropathic pains.  She has definitely been trying to increase exercise activity and adjusting her diet.  She is trying to lose slowly and has lost about 10 pounds in the last 1+ years.  She has been going to a "obesity clinic where they have adjusted her diet and are helping her get more active.  She tells me that home her systolic blood pressures usually range in the 110-120 mmHg range, it makes her pressures little hard to take because she just ate a pretty big lunch. Overall, she is not having any significant symptoms of chest pain or pressure with rest or exertion.  She does get little bit short of breath if she rushes upstairs, or carrying something heavy.  Otherwise no wheezing.  She has off-and-on little episodes of pinching sharp chest discomfort in the upper chest, but currently associated with exertion.  Cardiovascular  Review of Symptoms (Summary): positive for - Mild exertional dyspnea and mostly unilateral swelling goes  away at night. negative for - chest pain, irregular heartbeat, orthopnea, palpitations, paroxysmal nocturnal dyspnea, rapid heart rate, shortness of breath or Syncope/near syncope, TIA/amaurosis fugax.  The patient does not have symptoms concerning for COVID-19 infection (fever, chills, cough, or new shortness of breath).  The patient is practicing social distancing & Masking.   She has had stop 1 of 2 COVID-19 vaccines.  The second 1 is due on May 15.  REVIEWED OF SYSTEMS   Review of Systems  Constitutional: Negative for malaise/fatigue.  HENT: Negative for congestion and nosebleeds.   Respiratory: Negative for shortness of breath.   Cardiovascular: Positive for leg swelling (Right leg usually more than left.).  Gastrointestinal: Negative for blood in stool and melena.  Genitourinary: Negative for hematuria.  Musculoskeletal: Positive for joint pain and myalgias.  Neurological: Positive for tingling (Left leg) and focal weakness (Sometimes when she has her neuropathic pain down the left leg). Negative for weakness.  Psychiatric/Behavioral: Negative for depression and memory loss. The patient is not nervous/anxious and does not have insomnia.    I have reviewed and (if needed) personally updated the patient's problem list, medications, allergies, past medical and surgical history, social and family history.   PAST MEDICAL HISTORY   Past Medical History:  Diagnosis Date  . Breast cancer, stage 1 ([place])   . Colon polyp   . GERD (gastroesophageal reflux disease)   . H/O diastolic dysfunction XX123456   Grade 2  DD. 1.3 x 1.3 cm mass in RA on Echo -->Cardiac MRI 12/04: 1.9 x 1.6 cm mass in the lateral RA cavity. -- Most suggestive of thrombus--  calcified and  lamina and ted suggestive of chronic.; EF from 50% with no scar.  [name] History of breast cancer 2010   invasive ductal R breast; s/p lumpectomy, radiation and chemo  . Lumbar degenerative disc disease   . Obesity   . Personal history  of chemotherapy   . Personal history of radiation therapy   . 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  [name] Uterine fibroid    s/p embolization    PAST SURGICAL HISTORY   Past Surgical History:  Procedure Laterality Date  . BREAST BIOPSY Right 12/31/2012  . BREAST BIOPSY Right 09/06/2008  . BREAST BIOPSY Right 08/11/2008  . BREAST LUMPECTOMY  2010   right  . Cardiac MRI  December 2014   1.9 x 1.6 cm mass in the lateral RA cavity. -- Most suggestive of thrombus--  calcified anndd  laammiinnaated suggestive of chronic.; EF from 50-4% with no scar. Trivial effusion  . COLONOSCOPY  2010  . LUMBAR LAMINECTOMY/DECOMPRESSION MICRODISCECTOMY  2007, 2008   Dr. Hal Neer x 2  . POWER PORT    . TRANSTHORACIC ECHOCARDIOGRAM  November 2014; March 2016t   a) Mild LVH, EF of roughly 50%. Pseudo-normal LV filling (grade 3 diastolic soft. Mild LA dilation. 1.3 x 1.3 cm mass in right atrium.;; b) Low normal LV function; grade 2 diastolic dysfunction; calcified right atrial mass; no change compared to 06/11/13.  . TUBAL LIGATION  2010  . UTERINE FIBROID EMBOLIZATION  2008 or 2009    MEDICATIONS/ALLERGIES   Current Meds  Medication Sig  . ascorbic acid (VITAMIN C) 250 MG tablet Take 500 mg by mouth daily.  . Calcium Carbonate-Vitamin D (CALCIUM + D) 600-200 MG-UNIT TABS Take 2 tablets by mouth daily.    [name] diltiazem (CARDIZEM CD) 180 MG 24 hr capsule Take 1 capsule (180 mg total) by mouth daily.  [name] esomeprazole (NEXIUM 24HR) 20 MG capsule Take 20 mg by mouth daily at 12 noon.  . fluticasone (FLONASE) 50 MCG/ACT nasal spray Place 2 sprays into both nostrils daily as needed for allergies or rhinitis.  . furosemide (LASIX) 40 MG tablet TAKE 1 TABLET(40 MG) BY MOUTH EVERY OTHER DAY  . gabapentin (NEURONTIN) 600 MG tablet Take 600 mg by mouth at bedtime.  . lidocaine (LIDODERM) 5 %  Place 3 patches onto the skin daily.   [name] lisinopril (ZESTRIL) 5 MG tablet TAKE 1 TABLET(5 MG) BY MOUTH DAILY  . methocarbamol (ROBAXIN) 500 MG tablet Take 500 mg by mouth as needed for muscle spasms.  . Multiple Vitamins-Minerals (MULTIVITAMIN WITH MINERALS) tablet Take 1 tablet by mouth daily.    . nabumetone (RELAFEN) 500 MG tablet Take 500 mg by mouth 2 (two) times daily.    . nitrofurantoin, macrocrystal-monohydrate, (MACROBID) 100 MG capsule Take 1 capsule (100 mg total) by mouth 2 (two) times daily.  . rosuvastatin (CRESTOR) 40 MG tablet Take 1 tablet (40 mg total) by mouth daily. NEED OV.  . vitamin E 400 UNIT capsule Take 400 Units by mouth daily.  . [DISCONTINUED] potassium chloride SA (KLOR-CON M20) 20 MEQ tablet Take 1 tablet (20 mEq total) by mouth every other day.    Allergies  Allergen Reactions  . Adhesive [Tape] Hives  .  Morphine Hives    SOCIAL HISTORY/FAMILY HISTORY   Reviewed in Epic:  Pertinent findings: No notable changes. --> Is now at a obesity clinic discussing dietary adjustment and exercise.  OBJCTIVE -PE, EKG, labs   Wt Readings from Last 3 Encounters:  11/23/19 189 lb 6.4 oz (85.9 kg)  12/18/18 195 lb (88.5 kg)  08/07/18 202 lb (91.6 kg)    Physical Exam: BP 140/80   Pulse 74   Temp (!) 96.4 F (35.8 C)   Ht 5\' 5"  (1.651 m)   Wt 189 lb 6.4 oz (85.9 kg)   SpO2 99%   BMI 31.52 kg/m  Physical Exam  Constitutional: She is oriented to person, place, and time. She appears well-developed and well-nourished. No distress.  Healthy-appearing.  Well-groomed.  HENT:  Head: Normocephalic and atraumatic.  Neck: No hepatojugular reflux and no JVD present. Carotid bruit is not present.  Cardiovascular: Normal rate, regular rhythm, normal heart sounds and intact distal pulses. Exam reveals no gallop and no friction rub.  No murmur heard. Pulmonary/Chest: Effort normal and breath sounds normal. No respiratory distress. She has no wheezes. She has no rales.    Musculoskeletal:        General: Edema (Trivial LE) present. Normal range of motion.     Cervical back: Normal range of motion and neck supple.  Neurological: She is alert and oriented to person, place, and time.  Psychiatric: She has a normal mood and affect. Her behavior is normal. Judgment and thought content normal.  Vitals reviewed.    Adult ECG Report  Rate: 74 ;  Rhythm: normal sinus rhythm and Nonspecific ST and T wave changes.  Otherwise stable.;   Narrative Interpretation: Stable EKG. I would like to see her lipids are now she is doing her diet and exercise plan. Recent Labs: Has not had labs since 2019. Lab Results  Component Value Date   CHOL 297 (H) 10/14/2017   HDL 47 10/14/2017   LDLCALC 217 (H) 10/14/2017   TRIG 166 (H) 10/14/2017   CHOLHDL 6.3 (H) 10/14/2017   Lab Results  Component Value Date   CREATININE 0.81 01/16/2018   BUN 9 01/16/2018   NA 143 01/16/2018   K 4.3 01/16/2018   CL 103 01/16/2018   CO2 22 01/16/2018   Lab Results  Component Value Date   TSH 1.380 10/14/2017    ASSESSMENT/PLAN    Problem List Items Addressed This Visit    Hyperlipidemia with target LDL less than 100 - Primary (Chronic)    Very poorly controlled lipids.  Unfortunately she is not taking her rosuvastatin, not exactly sure why.  We should get lipids on her current PCP relatively soon.  Still think she probably needs to be on some type of therapy, but will defer treatment until we see upcoming labs.      Right atrial thrombus (Chronic)   Relevant Orders   EKG 12-Lead (Completed)   Hypertensive heart disease - diastolic dysfunction noted on echo (Chronic)    No signs or symptoms of diastolic heart failure with any PND, orthopnea, and edema associated with CHF.  Is on ACE inhibitor.  Not on beta-blocker, he is on diltiazem. Essentially euvolemic taking every other day furosemide.  Not taking additional dosing.      Relevant Orders   EKG 12-Lead (Completed)    Essential hypertension, benign (Chronic)    Blood pressure little high today, but she indicates that usually it is better than this at home.  Continue to follow.  For now we will continue with diltiazem and lisinopril along with furosemide that she is actually taking every day.      Abnormal echocardiogram (Chronic)    Likely chronic thrombus on MRI.  No requirement for anticoagulation..      Palpitations (Chronic)    Controlled on diltiazem.  No complaints.      Relevant Orders   EKG 12-Lead (Completed)   SOB (shortness of breath) on exertion    Still present, but not any worse or better than it has been.  Likely related to deconditioning.  Encouraged continued exercise and diet  Continue her treadmill exercises diet adjustment with obesity clinic           COVID-19 Education: The signs and symptoms of COVID-19 were discussed with the patient and how to seek care for testing (follow up with PCP or arrange E-visit).   The importance of social distancing and COVID-19 vaccination was discussed today.  I spent a total of 42minutes with the patient. >  50% of the time was spent in direct patient consultation.  Additional time spent with chart review  / charting (studies, outside notes, etc): 6 Total Time: 24 min   Current medicines are reviewed at length with the patient today.  (+/- concerns) n/a  Notice: This dictation was prepared with Dragon dictation along with smaller phrase technology. Any transcriptional errors that result from this process are unintentional and may not be corrected upon review.  Patient Instructions / Medication Changes & Studies & Tests Ordered   Patient Instructions  Medication Instructions:  *no changes  *If you need a refill on your cardiac medications before your next appointment, please call your pharmacy*   Lab Work: Will try to obtain labs from [facility]   Testing/Procedures: Not needed   Follow-Up: At [facility], you  and your health needs are our priority.  As part of our continuing mission to provide you with exceptional heart care, we have created designated Provider Care Teams.  These Care Teams include your primary Cardiologist (physician) and Advanced Practice Providers (APPs -  Physician Assistants and Nurse Practitioners) who all work together to provide you with the care you need, when you need it.   Your next appointment:   12 month(s)  The format for your next appointment:   In Person  Provider:   Glenetta Hew, MD   Other Instructions n/a    Studies Ordered:   Orders Placed This Encounter  Procedures  . EKG 12-Lead     Glenetta Hew, M.D., M.S. Interventional Cardiologist   Pager # [phone] Phone # [phone] [address] [place], [place] [address]   Thank you for choosing Heartcare at [facility]!!

## 2019-11-23 NOTE — Telephone Encounter (Signed)
Medication refilled . Patient had an appointment on 11/23/19 with Dr Ellyn Hack.

## 2019-11-23 NOTE — Telephone Encounter (Signed)
Patient came back to office to have prescription refilled. -  rx was done. Patient was informed  That a medical release form will need to be filled out .  patient states she will be going  Architectural technologist to [facility] . She states she will fill out  Form at that time.

## 2019-11-23 NOTE — Telephone Encounter (Signed)
Called  [facility] - requesting  Copy of recent labs per patient  Information to Dr Ellyn Hack.. Per  Receptionist needs a  Medical release form from patient.  RN called left message on patient's voicemail - informing her that a medical release form  Is need for Dr harding to obtain lab results. She may go to  [facility] and fill out the form or come to ur office and fill medical release and we can fax to [facility] .  Rn asked for patient to call back and let our office know which she prefer.

## 2019-11-29 ENCOUNTER — Encounter: Payer: Self-pay | Admitting: Cardiology

## 2019-11-29 NOTE — Assessment & Plan Note (Signed)
Controlled on diltiazem.  No complaints.

## 2019-11-29 NOTE — Assessment & Plan Note (Signed)
Blood pressure little high today, but she indicates that usually it is better than this at home.  Continue to follow.  For now we will continue with diltiazem and lisinopril along with furosemide that she is actually taking every day.

## 2019-11-29 NOTE — Assessment & Plan Note (Signed)
Very poorly controlled lipids.  Unfortunately she is not taking her rosuvastatin, not exactly sure why.  We should get lipids on her current PCP relatively soon.  Still think she probably needs to be on some type of therapy, but will defer treatment until we see upcoming labs.

## 2019-11-29 NOTE — Assessment & Plan Note (Signed)
Still present, but not any worse or better than it has been.  Likely related to deconditioning.  Encouraged continued exercise and diet  Continue her treadmill exercises diet adjustment with obesity clinic

## 2019-11-29 NOTE — Assessment & Plan Note (Addendum)
No signs or symptoms of diastolic heart failure with any PND, orthopnea, and edema associated with CHF.  Is on ACE inhibitor.  Not on beta-blocker, he is on diltiazem. Essentially euvolemic taking every other day furosemide.  Not taking additional dosing.

## 2019-11-29 NOTE — Assessment & Plan Note (Signed)
Likely chronic thrombus on MRI.  No requirement for anticoagulation.[name]

## 2019-11-30 ENCOUNTER — Telehealth: Payer: Self-pay | Admitting: *Deleted

## 2019-11-30 NOTE — Telephone Encounter (Signed)
-----   Message from Rita Ohara, MD sent at 11/29/2019  8:39 PM EDT ----- Maybe this is someone we need to get in? She has Medicare because she is disabled. Hasn't been seen in over a year. Sees cardiologist, and maybe is regular with her GYN (Dr. Garwin Brothers)? Either CPE vs a AWV might be indicated

## 2019-11-30 NOTE — Telephone Encounter (Signed)
Left message for patient to return my call.

## 2019-12-01 ENCOUNTER — Other Ambulatory Visit: Payer: Self-pay

## 2019-12-01 MED ORDER — LISINOPRIL 5 MG PO TABS: ORAL_TABLET | ORAL | 3 refills | Status: DC

## 2019-12-02 ENCOUNTER — Telehealth: Payer: Self-pay | Admitting: *Deleted

## 2019-12-02 NOTE — Telephone Encounter (Signed)
Left another message asking patient to please call me back if she would like to schedule CPE.

## 2019-12-24 ENCOUNTER — Other Ambulatory Visit: Payer: Self-pay

## 2019-12-24 ENCOUNTER — Encounter: Payer: Self-pay | Admitting: Medical

## 2019-12-24 ENCOUNTER — Ambulatory Visit (INDEPENDENT_AMBULATORY_CARE_PROVIDER_SITE_OTHER): Payer: Commercial Managed Care - PPO | Admitting: Medical

## 2019-12-24 VITALS — BP 126/84 | HR 86 | Temp 98.0°F

## 2019-12-24 DIAGNOSIS — M545 Low back pain, unspecified: Secondary | ICD-10-CM

## 2019-12-24 DIAGNOSIS — R300 Dysuria: Secondary | ICD-10-CM

## 2019-12-24 LAB — POCT URINALYSIS DIP (PROADVANTAGE DEVICE)
Bilirubin, UA: NEGATIVE
Blood, UA: NEGATIVE
Glucose, UA: NEGATIVE mg/dL
Ketones, POC UA: NEGATIVE mg/dL
Nitrite, UA: NEGATIVE
Protein Ur, POC: NEGATIVE mg/dL
Specific Gravity, Urine: 1.015
Urobilinogen, Ur: NEGATIVE
pH, UA: 7 (ref 5.0–8.0)

## 2019-12-24 MED ORDER — NITROFURANTOIN MONOHYD MACRO 100 MG PO CAPS: 100.0000 mg | ORAL_CAPSULE | Freq: Two times a day (BID) | ORAL | 0 refills | Status: DC

## 2019-12-24 NOTE — Progress Notes (Signed)
Subjective:   Olivia [name] is a 54 y.o. female who complains of possible urinary tract infection.  They report symptoms for 2 days.  Symptoms include right low back pain wrapping around to right lower abdomen some nausea.  Patient denies fever, vomiting, diarrhea, constipation, leg pain, paresthesias.  Last UTI was over a year ago.   Using heat pad for current symptoms.  She has hx/o chronic back pain, prior back surgery,   No recent trauma, injury or heavy lifting.  Heat pad did seem to help.   No other aggravating or relieving factors.  No other c/o.  Past Medical History:  Diagnosis Date  . Breast cancer, stage 1 ([place])   . Colon polyp   . GERD (gastroesophageal reflux disease)   . H/O diastolic dysfunction XX123456   Grade 2 DD. 1.3 x 1.3 cm mass in RA on Echo -->Cardiac MRI 12/04: 1.9 x 1.6 cm mass in the lateral RA cavity. -- Most suggestive of thrombus--  calcified and  lamina and ted suggestive of chronic.; EF from 50% with no scar.  [name] History of breast cancer 2010   invasive ductal R breast; s/p lumpectomy, radiation and chemo  . Lumbar degenerative disc disease   . Obesity   . Personal history of chemotherapy   . Personal history of radiation therapy   . 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  [name] Uterine fibroid    s/p embolization    Current Outpatient Medications on File Prior to Visit  Medication Sig Dispense Refill  . ascorbic acid (VITAMIN C) 250 MG tablet Take 500 mg by mouth daily.    . Calcium Carbonate-Vitamin D (CALCIUM + D) 600-200 MG-UNIT TABS Take 2 tablets by mouth daily.      [name] diltiazem (CARDIZEM CD) 180 MG 24 hr capsule Take 1 capsule (180 mg total) by mouth daily. 90 capsule 3  . esomeprazole (NEXIUM 24HR) 20 MG capsule Take 20 mg by mouth daily at 12 noon.    . fluticasone (FLONASE) 50 MCG/ACT nasal spray Place 2 sprays into both  nostrils daily as needed for allergies or rhinitis.    . furosemide (LASIX) 40 MG tablet TAKE 1 TABLET(40 MG) BY MOUTH EVERY OTHER DAY 30 tablet 3  . gabapentin (NEURONTIN) 600 MG tablet Take 600 mg by mouth at bedtime.    [name] lisinopril (ZESTRIL) 5 MG tablet Take 1 tablet ( 5 MG ) by mouth daily. 90 tablet 3  . methocarbamol (ROBAXIN) 500 MG tablet Take 500 mg by mouth as needed for muscle spasms.    . Multiple Vitamins-Minerals (MULTIVITAMIN WITH MINERALS) tablet Take 1 tablet by mouth daily.      . nabumetone (RELAFEN) 500 MG tablet Take 500 mg by mouth 2 (two) times daily.      . potassium chloride SA (KLOR-CON) 20 MEQ tablet TAKE 1 TABLET BY MOUTH EVERY OTHER DAY 90 tablet 3  . rosuvastatin (CRESTOR) 40 MG tablet Take 1 tablet (40 mg total) by mouth daily. NEED OV. 90 tablet 0  . vitamin E 400 UNIT capsule Take 400 Units by mouth daily.    [name] lidocaine (LIDODERM) 5 % Place 3 patches onto the skin daily.   5   No current facility-administered medications on file prior to visit.    ROS as in subjective  Reviewed allergies, medications, past medical, surgical, and social history.  Objective: Vitals:   12/24/19 1357  BP: 126/84  Pulse: 86  Temp: 98 F (36.7 C)    General appearance: alert, no distress, WD/WN, female Abdomen: +bs, soft, non tender, non distended, no masses, no hepatomegaly, no splenomegaly, no bruits Back: no CVA tenderness, nontender, mild pain with back extension but no pain with flexion.  lumbar surgery scar GU: deferred      Assessment: Encounter Diagnoses  Name Primary?  . Dysuria Yes  . Right-sided low back pain without sciatica, unspecified chronicity      Plan: Discussed symptoms, UA findings.    At this point too early to tell.   UA findings show mild leuk, pH 7, otherwise normal.  we will await culture.  In the meantime, c/t stretching, relative  Rest, good hydration, cranberry juice a few days, and if new or worse UTI symptoms begin Macrobid  . Otherwise await culture.    Olivia [name] was seen today for urinary tract infection.  Diagnoses and all orders for this visit:  Dysuria -     POCT Urinalysis DIP (Proadvantage Device) -     Urine Culture  Right-sided low back pain without sciatica, unspecified chronicity -     POCT Urinalysis DIP (Proadvantage Device) -     Urine Culture  Other orders -     nitrofurantoin, macrocrystal-monohydrate, (MACROBID) 100 MG capsule; Take 1 capsule (100 mg total) by mouth 2 (two) times daily.  Of note, our EMR computer system was down today so documentation was completed later in the day  F/u pending culture

## 2019-12-29 LAB — URINE CULTURE

## 2020-01-22 ENCOUNTER — Other Ambulatory Visit: Payer: Self-pay | Admitting: Cardiology

## 2020-02-04 ENCOUNTER — Other Ambulatory Visit: Payer: Self-pay | Admitting: Cardiology

## 2020-02-08 NOTE — Telephone Encounter (Signed)
Rx(s) sent to pharmacy electronically.  

## 2020-02-23 ENCOUNTER — Other Ambulatory Visit: Payer: Self-pay | Admitting: Obstetrics and Gynecology

## 2020-02-23 DIAGNOSIS — Z1231 Encounter for screening mammogram for malignant neoplasm of breast: Secondary | ICD-10-CM

## 2020-03-07 ENCOUNTER — Telehealth: Payer: Self-pay | Admitting: *Deleted

## 2020-03-07 DIAGNOSIS — E785 Hyperlipidemia, unspecified: Secondary | ICD-10-CM

## 2020-03-07 NOTE — Telephone Encounter (Signed)
Spoke with patient who is taking only RED YEAST RICE now. She was not taking anything at time of labs in April 2021. Explained that this supplement will not get her LDL to goal. Asked if she would like to repeat her labs now, since she has started red yeast rice, before starting new medication. She would like this.   Will notify MD/RN to see if this is OK

## 2020-03-07 NOTE — Telephone Encounter (Signed)
Follow up   Pt is returning call    

## 2020-03-07 NOTE — Telephone Encounter (Signed)
Left message for patient  To call back  For result --  ? If patient taking 40 mg Rosuvastatin . If not restart,

## 2020-03-07 NOTE — Telephone Encounter (Signed)
-----   Message from Leonie Man, MD sent at 02/28/2020  3:25 AM EDT ----- Labs from March 2021: Na+ 141, K+ 4.3, Cl- 103, HCO3- 26, BUN  10, Cr 0.82, Glu 99, Ca2+ 9.4; AST 14, ALT 16, AlkP 102 CBC: W 8.6, H/H 13.1/39.1, Plt 34.5 TC 287, TG 167, HDL 46, LDL 206   Poorly controlled  --> Needs to be back on rosuvastatin as ordered -- 40 mg.  Glenetta Hew, MD

## 2020-03-08 ENCOUNTER — Other Ambulatory Visit: Payer: Self-pay

## 2020-03-08 ENCOUNTER — Ambulatory Visit
Admission: RE | Admit: 2020-03-08 | Discharge: 2020-03-08 | Disposition: A | Discharge status: Home / Self Care | Payer: Medicare Other | Source: Ambulatory Visit | Attending: Obstetrics and Gynecology | Admitting: Obstetrics and Gynecology

## 2020-03-08 DIAGNOSIS — Z1231 Encounter for screening mammogram for malignant neoplasm of breast: Secondary | ICD-10-CM

## 2020-03-08 NOTE — Telephone Encounter (Signed)
We can check them again in 3 months after starting red yeast rice, but it will not bring her cholesterol down into a normal range.  Her LDL was 200.  Glenetta Hew, MD

## 2020-03-09 NOTE — Telephone Encounter (Signed)
Left detailed message with MD advice and lab info. Fasting lipid panel ordered

## 2020-03-11 ENCOUNTER — Other Ambulatory Visit: Payer: Self-pay | Admitting: Cardiology

## 2020-03-11 NOTE — Telephone Encounter (Signed)
   *  STAT* If patient is at the pharmacy, call can be transferred to refill team.   1. Which medications need to be refilled? (please list name of each medication and dose if known)   diltiazem (CARDIZEM CD) 180 MG 24 hr capsule    2. Which pharmacy/location (including street and city if local pharmacy) is medication to be sent to? [place], [place] - [address] AT [address]  3. Do they need a 30 day or 90 day supply? 90 days   Pt said she completely out of meds and want to make sure it she will get refill today

## 2020-03-21 ENCOUNTER — Ambulatory Visit: Payer: Commercial Managed Care - PPO | Admitting: Family Medicine

## 2020-04-01 ENCOUNTER — Other Ambulatory Visit: Payer: Self-pay | Admitting: Cardiology

## 2020-08-10 LAB — RESULTS CONSOLE HPV: CHL HPV: NEGATIVE

## 2020-08-10 LAB — HM PAP SMEAR: HM Pap smear: NEGATIVE

## 2020-08-12 ENCOUNTER — Other Ambulatory Visit: Payer: Self-pay | Admitting: Obstetrics and Gynecology

## 2020-08-12 DIAGNOSIS — N63 Unspecified lump in unspecified breast: Secondary | ICD-10-CM

## 2020-08-18 DIAGNOSIS — L669 Cicatricial alopecia, unspecified: Secondary | ICD-10-CM | POA: Diagnosis not present

## 2020-08-18 DIAGNOSIS — L810 Postinflammatory hyperpigmentation: Secondary | ICD-10-CM | POA: Diagnosis not present

## 2020-08-19 ENCOUNTER — Other Ambulatory Visit: Payer: Self-pay

## 2020-08-19 ENCOUNTER — Ambulatory Visit
Admission: RE | Admit: 2020-08-19 | Discharge: 2020-08-19 | Disposition: A | Discharge status: Home / Self Care | Payer: Medicare Other | Source: Ambulatory Visit | Attending: Obstetrics and Gynecology | Admitting: Obstetrics and Gynecology

## 2020-08-19 ENCOUNTER — Other Ambulatory Visit: Payer: Self-pay | Admitting: Obstetrics and Gynecology

## 2020-08-19 DIAGNOSIS — N63 Unspecified lump in unspecified breast: Secondary | ICD-10-CM

## 2020-09-15 ENCOUNTER — Encounter (HOSPITAL_COMMUNITY): Payer: Self-pay

## 2020-09-15 ENCOUNTER — Emergency Department (HOSPITAL_COMMUNITY): Payer: Commercial Managed Care - PPO

## 2020-09-15 ENCOUNTER — Other Ambulatory Visit: Payer: Self-pay

## 2020-09-15 ENCOUNTER — Emergency Department (HOSPITAL_COMMUNITY)
Admission: EM | Admit: 2020-09-15 | Discharge: 2020-09-15 | Disposition: A | Discharge status: Home / Self Care | Payer: Commercial Managed Care - PPO | Attending: Emergency Medicine | Admitting: Emergency Medicine

## 2020-09-15 DIAGNOSIS — I110 Hypertensive heart disease with heart failure: Secondary | ICD-10-CM | POA: Diagnosis not present

## 2020-09-15 DIAGNOSIS — Z853 Personal history of malignant neoplasm of breast: Secondary | ICD-10-CM | POA: Diagnosis not present

## 2020-09-15 DIAGNOSIS — Z79899 Other long term (current) drug therapy: Secondary | ICD-10-CM | POA: Diagnosis not present

## 2020-09-15 DIAGNOSIS — I5030 Unspecified diastolic (congestive) heart failure: Secondary | ICD-10-CM | POA: Insufficient documentation

## 2020-09-15 DIAGNOSIS — Z20822 Contact with and (suspected) exposure to covid-19: Secondary | ICD-10-CM | POA: Diagnosis not present

## 2020-09-15 DIAGNOSIS — R0789 Other chest pain: Secondary | ICD-10-CM | POA: Insufficient documentation

## 2020-09-15 HISTORY — DX: Essential (primary) hypertension: I10

## 2020-09-15 LAB — HEPATIC FUNCTION PANEL
ALT: 15 U/L (ref 0–44)
AST: 17 U/L (ref 15–41)
Albumin: 3.9 g/dL (ref 3.5–5.0)
Alkaline Phosphatase: 66 U/L (ref 38–126)
Bilirubin, Direct: 0.1 mg/dL (ref 0.0–0.2)
Indirect Bilirubin: 0.6 mg/dL (ref 0.3–0.9)
Total Bilirubin: 0.7 mg/dL (ref 0.3–1.2)
Total Protein: 7.4 g/dL (ref 6.5–8.1)

## 2020-09-15 LAB — CBC
HCT: 41.9 % (ref 36.0–46.0)
Hemoglobin: 14.3 g/dL (ref 12.0–15.0)
MCH: 31.7 pg (ref 26.0–34.0)
MCHC: 34.1 g/dL (ref 30.0–36.0)
MCV: 92.9 fL (ref 80.0–100.0)
Platelets: 273 10*3/uL (ref 150–400)
RBC: 4.51 MIL/uL (ref 3.87–5.11)
RDW: 11.9 % (ref 11.5–15.5)
WBC: 8.6 10*3/uL (ref 4.0–10.5)
nRBC: 0 % (ref 0.0–0.2)

## 2020-09-15 LAB — BASIC METABOLIC PANEL
Anion gap: 9 (ref 5–15)
BUN: 8 mg/dL (ref 6–20)
CO2: 27 mmol/L (ref 22–32)
Calcium: 9.3 mg/dL (ref 8.9–10.3)
Chloride: 104 mmol/L (ref 98–111)
Creatinine, Ser: 0.74 mg/dL (ref 0.44–1.00)
GFR, Estimated: >60 mL/min (ref >60)
Glucose, Bld: 105 mg/dL — ABNORMAL HIGH (ref 70–99)
Potassium: 3.7 mmol/L (ref 3.5–5.1)
Sodium: 140 mmol/L (ref 135–145)

## 2020-09-15 LAB — I-STAT BETA HCG BLOOD, ED (MC, WL, AP ONLY): I-stat hCG, quantitative: 9.2 m[IU]/mL — ABNORMAL HIGH (ref <5)

## 2020-09-15 LAB — D-DIMER, QUANTITATIVE: D-Dimer, Quant: 0.73 ug/mL-FEU — ABNORMAL HIGH (ref 0.00–0.50)

## 2020-09-15 LAB — POC SARS CORONAVIRUS 2 AG -  ED: SARS Coronavirus 2 Ag: NEGATIVE

## 2020-09-15 LAB — TROPONIN I (HIGH SENSITIVITY)
Troponin I (High Sensitivity): 5 ng/L (ref <18)
Troponin I (High Sensitivity): 5 ng/L (ref <18)

## 2020-09-15 LAB — LIPASE, BLOOD: Lipase: 40 U/L (ref 11–51)

## 2020-09-15 MED ORDER — HYDROCODONE-ACETAMINOPHEN 5-325 MG PO TABS: 1.0000 | ORAL_TABLET | Freq: Four times a day (QID) | ORAL | 0 refills | Status: DC | PRN

## 2020-09-15 MED ORDER — IOHEXOL 350 MG/ML SOLN
100.0000 mL | Freq: Once | INTRAVENOUS | Status: AC | PRN
  Administered 2020-09-15: 59 mL via INTRAVENOUS

## 2020-09-15 MED ORDER — IBUPROFEN 400 MG PO TABS: 400.0000 mg | ORAL_TABLET | Freq: Three times a day (TID) | ORAL | 0 refills | Status: AC

## 2020-09-15 MED ORDER — FENTANYL CITRATE (PF) 100 MCG/2ML IJ SOLN
50.0000 ug | Freq: Once | INTRAMUSCULAR | Status: AC
  Administered 2020-09-15: 50 ug via INTRAVENOUS
  Filled 2020-09-15: qty 2

## 2020-09-15 MED ORDER — KETOROLAC TROMETHAMINE 30 MG/ML IJ SOLN
15.0000 mg | Freq: Once | INTRAMUSCULAR | Status: AC
  Administered 2020-09-15: 15 mg via INTRAVENOUS
  Filled 2020-09-15: qty 1

## 2020-09-15 MED ORDER — SODIUM CHLORIDE 0.9 % IV BOLUS
500.0000 mL | Freq: Once | INTRAVENOUS | Status: AC
  Administered 2020-09-15: 500 mL via INTRAVENOUS

## 2020-09-15 NOTE — ED Triage Notes (Signed)
Patient complains of epigastric and pain behind right breast since mid-morning. Describes as aching pain. No cold, no cough. NAD

## 2020-09-15 NOTE — ED Provider Notes (Signed)
[facility] EMERGENCY DEPARTMENT Provider Note   CSN: [id number] Arrival date & time: 09/15/20  1217     History No chief complaint on file.   Olivia [name] is a 55 y.o. female.  HPI Patient with a history of breast cancer in the distant past presents with new chest pain.  She was in her usual state of health, resting in supine position when she developed focal pain in the right inferior lateral chest wall.  Since onset it has been persistent, sore, 9/10 in severity, not improved with ibuprofen and there is no new dyspnea, no fever, no nausea, no vomiting. No recent change in medication, diet, activity, notable events.     Past Medical History:  Diagnosis Date  . Breast cancer, stage 1 ([place])   . Colon polyp   . GERD (gastroesophageal reflux disease)   . H/O diastolic dysfunction 65/7846   Grade 2 DD. 1.3 x 1.3 cm mass in RA on Echo -->Cardiac MRI 12/04: 1.9 x 1.6 cm mass in the lateral RA cavity. -- Most suggestive of thrombus--  calcified and  lamina and ted suggestive of chronic.; EF from 50% with no scar.  [name] History of breast cancer 2010   invasive ductal R breast; s/p lumpectomy, radiation and chemo  . Hypertension   . Lumbar degenerative disc disease   . Obesity   . Personal history of chemotherapy   . Personal history of radiation therapy   . 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  [name] Uterine fibroid    s/p embolization    Patient Active Problem List   Diagnosis Date Noted  . Bilateral lower extremity edema 10/29/2017  . Costochondritis, acute 06/05/2017  . Mass of chest wall, left 03/28/2015  . Breast lump in female 03/28/2015  . Personal history of malignant neoplasm of breast 03/28/2015  . Hypertensive heart disease - diastolic dysfunction noted on echo 10/16/2014  . SOB (shortness of breath) on exertion 10/16/2013  .  Palpitations 10/16/2013  . Right atrial thrombus 10/16/2013  . Essential hypertension, benign 06/17/2013  . Abnormal echocardiogram 06/17/2013  . GERD (gastroesophageal reflux disease) 06/11/2013  . Generalized anxiety disorder 06/11/2013  . History of right breast cancer 11/07/2011  . Hyperlipidemia with target LDL less than 100 08/23/2011  . Shortness of breath 05/13/2009    Past Surgical History:  Procedure Laterality Date  . BREAST BIOPSY Right 12/31/2012  . BREAST BIOPSY Right 09/06/2008  . BREAST BIOPSY Right 08/11/2008  . BREAST LUMPECTOMY  2010   right  . Cardiac MRI  December 2014   1.9 x 1.6 cm mass in the lateral RA cavity. -- Most suggestive of thrombus--  calcified anndd  laammiinnaated suggestive of chronic.; EF from 50-4% with no scar. Trivial effusion  . COLONOSCOPY  2010  . LUMBAR LAMINECTOMY/DECOMPRESSION MICRODISCECTOMY  2007, 2008   Dr. Hal Neer x 2  . POWER PORT    . TRANSTHORACIC ECHOCARDIOGRAM  November 2014; March 2016t   a) Mild LVH, EF of roughly 50%. Pseudo-normal LV filling (grade 3 diastolic soft. Mild LA dilation. 1.3 x 1.3 cm mass in right atrium.;; b) Low normal LV function; grade 2 diastolic dysfunction; calcified right atrial mass; no change compared to 06/11/13.  . TUBAL LIGATION  2010  . UTERINE FIBROID EMBOLIZATION  2008 or 2009     OB History   No obstetric history on file.  Family History  Problem Relation Age of Onset  . Breast cancer Mother   . Cancer Maternal Grandmother        renal cancer    Social History   Tobacco Use  . Smoking status: Never Smoker  . Smokeless tobacco: Never Used  Vaping Use  . Vaping Use: Never used  Substance Use Topics  . Alcohol use: No  . Drug use: No    Home Medications Prior to Admission medications   Medication Sig Start Date End Date Taking? Authorizing Provider  ascorbic acid (VITAMIN C) 250 MG tablet Take 500 mg by mouth daily.   Yes [provider]  Calcium  Carbonate-Vitamin D 600-200 MG-UNIT TABS Take 2 tablets by mouth daily.   Yes [provider]  diltiazem (CARDIZEM CD) 180 MG 24 hr capsule TAKE 1 CAPSULE(180 MG) BY MOUTH DAILY(OCEANSIDE GENERIC PER PT) Patient taking differently: Take 180 mg by mouth daily. 03/11/20  Yes Leonie Man, MD  esomeprazole (NEXIUM) 20 MG capsule Take 20 mg by mouth daily at 12 noon.   Yes [provider]  furosemide (LASIX) 40 MG tablet TAKE 1 TABLET(40 MG) BY MOUTH EVERY OTHER DAY Patient taking differently: Take 40 mg by mouth daily. 04/01/20  Yes Leonie Man, MD  gabapentin (NEURONTIN) 600 MG tablet Take 600 mg by mouth at bedtime.   Yes [provider]  lidocaine (LIDODERM) 5 % Place 1 patch onto the skin daily. 04/30/14  Yes [provider]  lisinopril (ZESTRIL) 5 MG tablet Take 1 tablet ( 5 MG ) by mouth daily. 12/01/19  Yes Leonie Man, MD  methocarbamol (ROBAXIN) 500 MG tablet Take 500 mg by mouth as needed for muscle spasms.   Yes [provider]  Multiple Vitamins-Minerals (MULTIVITAMIN WITH MINERALS) tablet Take 1 tablet by mouth daily.   Yes [provider]  nabumetone (RELAFEN) 500 MG tablet Take 500 mg by mouth 2 (two) times daily.   Yes [provider]  potassium chloride SA (KLOR-CON) 20 MEQ tablet Take 1 tablet (20 mEq total) by mouth every other day. 02/08/20  Yes Leonie Man, MD  vitamin E 400 UNIT capsule Take 400 Units by mouth daily.   Yes [provider]    Allergies    Adhesive [tape] and Morphine  Review of Systems   Review of Systems  Constitutional:       Per HPI, otherwise negative  HENT:       Per HPI, otherwise negative  Respiratory:       Per HPI, otherwise negative  Cardiovascular:       Per HPI, otherwise negative  Gastrointestinal: Negative for vomiting.  Endocrine:       Negative aside from HPI  Genitourinary:       Neg aside from HPI   Musculoskeletal:       Per HPI, otherwise  negative  Skin: Negative.   Neurological: Negative for syncope.    Physical Exam Updated Vital Signs BP (!) 144/99   Pulse 96   Temp 97.6 F (36.4 C) (Oral)   Resp 15   SpO2 100%   Physical Exam Vitals and nursing note reviewed.  Constitutional:      General: She is not in acute distress.    Appearance: She is well-developed and well-nourished.  HENT:     Head: Normocephalic and atraumatic.  Eyes:     Extraocular Movements: EOM normal.     Conjunctiva/sclera: Conjunctivae normal.  Cardiovascular:  Rate and Rhythm: Normal rate and regular rhythm.  Pulmonary:     Effort: Pulmonary effort is normal. No respiratory distress.     Breath sounds: Normal breath sounds. No stridor.  Abdominal:     General: There is no distension.     Tenderness: There is no abdominal tenderness. There is no guarding.  Musculoskeletal:        General: No edema.  Skin:    General: Skin is warm and dry.  Neurological:     Mental Status: She is alert and oriented to person, place, and time.     Cranial Nerves: No cranial nerve deficit.  Psychiatric:        Mood and Affect: Mood and affect normal.     ED Results / Procedures / Treatments   Labs (all labs ordered are listed, but only abnormal results are displayed) Labs Reviewed  BASIC METABOLIC PANEL - Abnormal; Notable for the following components:      Result Value   Glucose, Bld 105 (*)    All other components within normal limits  I-STAT BETA HCG BLOOD, ED ([facility], [facility], [facility] ONLY) - Abnormal; Notable for the following components:   I-stat hCG, quantitative 9.2 (*)    All other components within normal limits  CBC  LIPASE, BLOOD  HEPATIC FUNCTION PANEL  D-DIMER, QUANTITATIVE  TROPONIN I (HIGH SENSITIVITY)  TROPONIN I (HIGH SENSITIVITY)    EKG EKG Interpretation  Date/Time:  Thursday September 15 2020 12:23:50 EST Ventricular Rate:  98 PR Interval:  158 QRS Duration: 80 QT Interval:  338 QTC Calculation: 431 R Axis:   15 Text  Interpretation: Normal sinus rhythm Minimal voltage criteria for LVH, may be normal variant ( R in aVL ) Nonspecific T wave abnormality Abnormal ECG Confirmed by Carmin Muskrat [phone]) on 09/15/2020 3:04:57 PM   Radiology DG Chest 2 View  Result Date: 09/15/2020 CLINICAL DATA:  Chest pain. EXAM: CHEST - 2 VIEW COMPARISON:  01/16/2018.  CT FINDINGS: Mediastinum and hilar structures normal. Low lung volumes. Lungs are clear. No pleural effusion or pneumothorax. Tricuspid/right atrial valve calcification again noted. Heart size normal. No acute bony abnormality. IMPRESSION: Low lung volumes. No acute cardiopulmonary disease. Electronically Signed   By: Marcello Moores  Register   On: 09/15/2020 13:18    Procedures Procedures   Medications Ordered in ED Medications  fentaNYL (SUBLIMAZE) injection 50 mcg (50 mcg Intravenous Given 09/15/20 1635)  sodium chloride 0.9 % bolus 500 mL (500 mLs Intravenous New Bag/Given 09/15/20 1635)    ED Course  I have reviewed the triage vital signs and the nursing notes.  Pertinent labs & imaging results that were available during my care of the patient were reviewed by me and considered in my medical decision making (see chart for details).     With consideration of ACS, patient had labs drawn in triage, and after initial evaluation placed on continuous cardiac monitoring, pulse oximetry.  Pulse oximetry 99% room air normal Cardiac monitor 95/105, sinus, intermittently tachycardic, abnormal  Update:, Patient now accompanied by female companion. We discussed initial findings. Pain is diminished but remains present, focally in the right lower chest wall.  9:09 PM Patient sleeping, awakens easily.  Notes her pain is substantially better. We discussed all findings, CT PE with no evidence for pulmonary embolism, no pneumonia, no pneumothorax. 2 normal troponin, nonischemic EKG, negative Covid test, all reassuring.  Similarly reassuring the patient's labs, without  substantial abnormalities, absence of abnormalities of cardiac monitoring now for several hours.  We discussed her Hx of atrial thrombus, she is no longer on anticoagulants.  We discussed possibilities for her right-sided lower chest pain, including early shingles versus GI etiology, she continues to reiterate no GI complaints, voices understanding of return precautions, follow-up instructions. Patient amenable to, appropriate for discharge with close outpatient follow-up.   Final Clinical Impression(s) / ED Diagnoses Final diagnoses:  Atypical chest pain    Rx / DC Orders ED Discharge Orders         Ordered    HYDROcodone-acetaminophen (NORCO/VICODIN) 5-325 MG tablet  Every 6 hours PRN        09/15/20 2120    ibuprofen (ADVIL) 400 MG tablet  3 times daily        09/15/20 2120           Carmin Muskrat, MD 09/15/20 2120

## 2020-09-15 NOTE — Discharge Instructions (Addendum)
As discussed, your evaluation today has been largely reassuring.  But, it is important that you monitor your condition carefully, and do not hesitate to return to the ED if you develop new, or concerning changes in your condition. ? ?Otherwise, please follow-up with your physician for appropriate ongoing care. ? ?

## 2020-09-22 ENCOUNTER — Other Ambulatory Visit: Payer: Commercial Managed Care - PPO

## 2020-10-28 ENCOUNTER — Other Ambulatory Visit: Payer: Self-pay | Admitting: Cardiology

## 2020-11-27 ENCOUNTER — Other Ambulatory Visit: Payer: Self-pay | Admitting: Cardiology

## 2020-12-18 ENCOUNTER — Other Ambulatory Visit: Payer: Self-pay | Admitting: Cardiology

## 2020-12-19 NOTE — Telephone Encounter (Signed)
Rx(s) sent to pharmacy electronically.  

## 2020-12-27 ENCOUNTER — Other Ambulatory Visit: Payer: Self-pay | Admitting: Cardiology

## 2021-02-03 ENCOUNTER — Other Ambulatory Visit: Payer: Self-pay | Admitting: Obstetrics and Gynecology

## 2021-02-03 DIAGNOSIS — Z1231 Encounter for screening mammogram for malignant neoplasm of breast: Secondary | ICD-10-CM

## 2021-02-13 ENCOUNTER — Other Ambulatory Visit: Payer: Self-pay | Admitting: Cardiology

## 2021-03-16 ENCOUNTER — Encounter: Payer: Self-pay | Admitting: Family Medicine

## 2021-03-16 ENCOUNTER — Other Ambulatory Visit: Payer: Self-pay

## 2021-03-16 ENCOUNTER — Telehealth (INDEPENDENT_AMBULATORY_CARE_PROVIDER_SITE_OTHER): Payer: Medicare Other | Admitting: Family Medicine

## 2021-03-16 VITALS — Ht 65.0 in | Wt 170.0 lb

## 2021-03-16 DIAGNOSIS — J069 Acute upper respiratory infection, unspecified: Secondary | ICD-10-CM | POA: Diagnosis not present

## 2021-03-16 DIAGNOSIS — R059 Cough, unspecified: Secondary | ICD-10-CM

## 2021-03-16 NOTE — Progress Notes (Signed)
Start time: 2:43; had to call pt at 2:47 when didn't answer--in noisy place. Pt joined at 2:50--unable to see or hear patient (just see initials, that she has joined call) End time: 3:08  Virtual Visit via Video Note--had to change to telephone, as unable to get patient to get her camera/microphone working (she could see me)  I connected with [name] on 03/16/21 by telephone, after failing to adequately connect via video application, and verified that I am speaking with the correct person using two identifiers.  Location: Patient: at school, noisy background ([employer]) Provider: office   I discussed the limitations of evaluation and management by telemedicine and the availability of in person appointments. The patient expressed understanding and agreed to proceed.  History of Present Illness:  Chief Complaint  Patient presents with   other    Chills, head ache, runny nose started Tuesday had low grade fever, also has ST and cough, nose stuffed up tested negative for covid yesterday and day before.    Monday she had a little scratchy throat. Tuesday she started with runny nose, headache, stuffiness, weak and chills. Nasal drainage is clear.  Cough is nonproductive, sometimes gets up clear mucus. Thinks she had fever on Tuesday, has not checked temperature in the last 2 days.  Feels like she had one yesterday.  Took tylenol today. She is having pain in her forehead and around her nose and cheeks. Mucus is thick. She is concerned she has a sinus infection.  She took COVID test last night, and 2 days ago, both negative. No known COVID contacts. Currently in school, around college-aged students. First day on campus at [employer] was Tuesday Doesn't get out of class until 4:45 today. Has more tests at home.  PMH, PSH, SH reviewed  Outpatient Encounter Medications as of 03/16/2021  Medication Sig   ascorbic acid (VITAMIN C) 250 MG tablet Take 500 mg by mouth daily.   Calcium  Carbonate-Vitamin D 600-200 MG-UNIT TABS Take 2 tablets by mouth daily.   diltiazem (CARDIZEM CD) 180 MG 24 hr capsule TAKE 1 CAPSULE BY MOUTH DAILY   esomeprazole (NEXIUM) 20 MG capsule Take 20 mg by mouth daily at 12 noon.   furosemide (LASIX) 40 MG tablet TAKE 1 TABLET(40 MG) BY MOUTH EVERY OTHER DAY (Patient taking differently: Take 40 mg by mouth daily.)   gabapentin (NEURONTIN) 600 MG tablet Take 600 mg by mouth at bedtime.   lidocaine (LIDODERM) 5 % Place 1 patch onto the skin daily.   lisinopril (ZESTRIL) 5 MG tablet TAKE 1 TABLET(5 MG) BY MOUTH DAILY   methocarbamol (ROBAXIN) 500 MG tablet Take 500 mg by mouth as needed for muscle spasms.   Multiple Vitamins-Minerals (MULTIVITAMIN WITH MINERALS) tablet Take 1 tablet by mouth daily.   nabumetone (RELAFEN) 500 MG tablet Take 500 mg by mouth 2 (two) times daily.   potassium chloride SA (KLOR-CON) 20 MEQ tablet Take 1 tablet (20 mEq total) by mouth every other day.   vitamin E 400 UNIT capsule Take 400 Units by mouth daily.   [DISCONTINUED] HYDROcodone-acetaminophen (NORCO/VICODIN) 5-325 MG tablet Take 1 tablet by mouth every 6 (six) hours as needed for severe pain.   No facility-administered encounter medications on file as of 03/16/2021.   Allergies  Allergen Reactions   Adhesive [Tape] Hives   Morphine Hives   ROS: URI symptoms, fever and headache per HPI. No loss of taste/smell No n/v/d She is having slight shortness of breath with walking and with coughing spells.  No h/o asthma    Observations/Objective:  Ht 5' 5"  (1.651 m)   Wt 170 lb (77.1 kg)   BMI 28.29 kg/m   She is speaking easily, in no distress. Some coughing sporadically during visit (no big spells). She is alert and oriented. She is in a very noisy environment and difficult at times to hear.   Assessment and Plan:  Drink a lot of water. Take Mucinex regularly (I recommend getting the 12 hour plain Mucinex and taking it twice daily.)  If cough gets  worse, you can start Delsym syrup, twice daily if needed. I recommend using sinus rinses once or twice daily (sinus rinse kit or neti-pot). If sinus pain is getting worse, and if mucus becomes persistently discolored, that can indication bacterial infection and need antibiotics.  At this point, it sounds viral, and cannot exclude COVID.   We are seeing people with COVID test negative on rapid tests within the first 2-3 days of symptoms. PCR tests are positive earlier. We discussed options of re-testing at home, vs coming for rapid test here, with PCR if the rapid test is negative. You are scheduled to come tomorrow for this test, as your schedule doesn't allow you to get here within our office hours today. I am out of the office tomorrow, but will do my best to get you your results.  Please check your MyChart for the results.  I recommend that you isolate while awaiting testing (not be in class) and results. If the test is positive, you will need to remain in isolation for 5-10 days.  Monday counts as day 0 (first day of mild symptoms), Tuesday is day 1.  You would need to isolate through Saturday.  You can leave isolation but remain masked 100% of the time on Sunday through Thursday (days 6-10) if you are no longer having fever and if your respiratory symptoms are significantly improved. If you are still coughing a lot (or having fever, feeling worse), then you should isolate for the full 10 days (through next Friday).   Follow Up Instructions:    I discussed the assessment and treatment plan with the patient. The patient was provided an opportunity to ask questions and all were answered. The patient agreed with the plan and demonstrated an understanding of the instructions.   The patient was advised to call back or seek an in-person evaluation if the symptoms worsen or if the condition fails to improve as anticipated.  I spent 27 minutes dedicated to the care of this patient, including  pre-visit review of records, face to face time, post-visit ordering of testing and documentation.    [name], MD

## 2021-03-16 NOTE — Patient Instructions (Signed)
Drink a lot of water. Take Mucinex regularly (I recommend getting the 12 hour plain Mucinex and taking it twice daily.)  If cough gets worse, you can start Delsym syrup, twice daily if needed. I recommend using sinus rinses once or twice daily (sinus rinse kit or neti-pot). If sinus pain is getting worse, and if mucus becomes persistently discolored, that can indication bacterial infection and need antibiotics.  At this point, it sounds viral, and cannot exclude COVID.   We are seeing people with COVID test negative on rapid tests within the first 2-3 days of symptoms. PCR tests are positive earlier. We discussed options of re-testing at home, vs coming for rapid test here, with PCR if the rapid test is negative. You are scheduled to come tomorrow for this test, as your schedule doesn't allow you to get here within our office hours today. I am out of the office tomorrow, but will do my best to get you your results.  Please check your MyChart for the results.  I recommend that you isolate while awaiting testing (not be in class) and results. If the test is positive, you will need to remain in isolation for 5-10 days.  Monday counts as day 0 (first day of mild symptoms), Tuesday is day 1.  You would need to isolate through Saturday.  You can leave isolation but remain masked 100% of the time on Sunday through Thursday (days 6-10) if you are no longer having fever and if your respiratory symptoms are significantly improved. If you are still coughing a lot (or having fever, feeling worse), then you should isolate for the full 10 days (through next Friday).

## 2021-03-17 ENCOUNTER — Telehealth: Payer: Self-pay

## 2021-03-17 ENCOUNTER — Other Ambulatory Visit (INDEPENDENT_AMBULATORY_CARE_PROVIDER_SITE_OTHER): Payer: Medicare Other

## 2021-03-17 DIAGNOSIS — R059 Cough, unspecified: Secondary | ICD-10-CM | POA: Diagnosis not present

## 2021-03-17 DIAGNOSIS — J069 Acute upper respiratory infection, unspecified: Secondary | ICD-10-CM

## 2021-03-17 LAB — POC COVID19 BINAXNOW: SARS Coronavirus 2 Ag: POSITIVE — AB

## 2021-03-17 MED ORDER — MOLNUPIRAVIR 200 MG PO CAPS: 800.0000 mg | ORAL_CAPSULE | Freq: Two times a day (BID) | ORAL | 0 refills | Status: DC

## 2021-03-17 NOTE — Telephone Encounter (Signed)
Pt covid tesT was positive 03/17/21 at 11:30 Am. Pt was called but no answer LVM. Please advise. Pe Ell

## 2021-03-17 NOTE — Telephone Encounter (Signed)
Pt was notified. I have sent in medication

## 2021-03-17 NOTE — Addendum Note (Signed)
Addended by: Minette Headland A on: 03/17/2021 02:26 PM   Modules accepted: Orders

## 2021-03-31 ENCOUNTER — Ambulatory Visit
Admission: RE | Admit: 2021-03-31 | Discharge: 2021-03-31 | Disposition: A | Discharge status: Home / Self Care | Payer: Medicare Other | Source: Ambulatory Visit | Attending: Obstetrics and Gynecology | Admitting: Obstetrics and Gynecology

## 2021-03-31 ENCOUNTER — Other Ambulatory Visit: Payer: Self-pay

## 2021-03-31 DIAGNOSIS — Z1231 Encounter for screening mammogram for malignant neoplasm of breast: Secondary | ICD-10-CM

## 2021-04-06 ENCOUNTER — Telehealth: Payer: Self-pay | Admitting: Family Medicine

## 2021-04-06 NOTE — Telephone Encounter (Signed)
Pt called and states that [employer] informed her that she needs Tdap and MMR. Pt states she needs ASAP. Please advise pt if ok. Pt can be reached at [phone].

## 2021-04-06 NOTE — Telephone Encounter (Signed)
She has never had any wellness visit/physical here. Her last Tdap was actually 12/23/11 (so not due until 2023) I have an opening for a physical Monday afternoon. Can she come then? She should bring any paperwork/forms needed.

## 2021-04-09 NOTE — Progress Notes (Deleted)
Olivia [name] is a 55 y.o. female who presents for a complete physical.  She is under the care of Dr. Garwin Brothers for her GYN exam.  No records have been received from her. She has the following concerns:  She was seen here last month with COVID, otherwise hasn't been seen here in a while, seen by mid-levels in the last 2 years for UTI's.  Chart reviewed--had steroid injections to L shoulder for adhesive capsulitis in Nov and Dec 2021.  weekly visits to "[facility]) since 08/2019--??[place]?? What is she getting done?  She has hypertension, hypertensive heart disease with diastolic dysfunction noted on echo, palpitations, and hyperlipidemia. She is under the care of Dr. Ellyn Hack, whom she last saw in 10/2019.  She remains on diltazem and lisinopril. She used to take lasix and potassium daily (?? Anymore? Daily, qod??)  She sees [facility]  Last lipids??? (Repeated since 2021 at [facility]?)  Lab Results  Component Value Date   CHOL 297 (H) 10/14/2017   HDL 47 10/14/2017   LDLCALC 217 (H) 10/14/2017   TRIG 166 (H) 10/14/2017   CHOLHDL 6.3 (H) 10/14/2017       ER visit 08/2020 with R lateral chest pain. 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 Eval included: CT PE with no evidence for pulmonary embolism, no pneumonia, no pneumothorax. 2 normal troponin, nonischemic EKG, negative Covid test, all reassuring.  Similarly reassuring the patient's labs, without substantial abnormalities, absence of abnormalities of cardiac monitoring now for several hours.      Immunization History  Administered Date(s) Administered   Moderna Sars-Covid-2 Vaccination 11/14/2019, 12/12/2019   Td 07/30/2004   Tdap 12/23/2011   Last Pap smear: Last mammogram: Last colonoscopy:  09/2018 Dr. Collene Mares, normal. 10 yr f/u  Last DEXA: Dentist: Ophtho: Exercise:   PMH, PSH, SH and FH were reviewed and updated   The patient denies anorexia, fever, weight changes, headaches,  vision changes,  decreased hearing, ear pain, sore throat, breast concerns, chest pain, palpitations, dizziness, syncope, dyspnea on exertion, cough, swelling, nausea, vomiting, diarrhea, constipation, abdominal pain, melena, hematochezia, indigestion/heartburn, hematuria, incontinence, dysuria, vaginal bleeding, discharge, odor or itch, genital lesions, joint pains, numbness, tingling, weakness, tremor, suspicious skin lesions, depression, anxiety, abnormal bleeding/bruising, or enlarged lymph nodes. *** update all  Cycles? Hot flashes? Pain? GERD? On meds Edema?   PHYSICAL EXAM:  There were no vitals taken for this visit.  General Appearance:    Alert, cooperative, no distress, appears stated age  Head:    Normocephalic, without obvious abnormality, atraumatic  Eyes:    PERRL, conjunctiva/corneas clear, EOM's intact, fundi    benign  Ears:    Normal TM's and external ear canals  Nose:   Not examined, wearing mask due to COVID-19 pandemic  Throat:   Not examined, wearing mask due to COVID-19 pandemic  Neck:   Supple, no lymphadenopathy;  thyroid:  no enlargement/ tenderness/nodules; no carotid bruit or JVD  Back:    Spine nontender, no curvature, ROM normal, no CVA     tenderness  Lungs:     Clear to auscultation bilaterally without wheezes, rales or     ronchi; respirations unlabored  Chest Wall:    No tenderness or deformity   Heart:    Regular rate and rhythm, S1 and S2 normal, no murmur, rub or gallop  Breast Exam:    Deferred to GYN  Abdomen:     Soft, non-tender, nondistended, normoactive bowel sounds, no masses, no  hepatosplenomegaly  Genitalia:    Deferred to GYN     Extremities:   No clubbing, cyanosis or edema  Pulses:   2+ and symmetric all extremities  Skin:   Skin color, texture, turgor normal, no rashes or lesions  Lymph nodes:   Cervical, supraclavicular, and axillary nodes normal  Neurologic:   Normal strength, sensation and gait; reflexes 2+ and symmetric throughout          Psych:    Normal mood, affect, hygiene and grooming.    No flowsheet data found.   ASSESSMENT/PLAN:   HOPEFULLY SHE BROUGHT FORMS--DOES SHE NEED MMR, OR MMR TITERS? DOES SHE NEED TDAP, OR JUST THE DATE OF LAST ONE (WHICH IS <10 YEARS)  ANY COVID BOOSTER, or just 2 Moderna? (Just had illness last month) FLU SHOTS?? OFFER  Has she had any Shingrix??   RECORDS FROM DR COUSINS--WE HAVE NO MAMMO OR PAPS (since 2011). IS SHE UTD IN VISITS?? NEED TO GET. I see weekly visits since early 2021 in system under [facility]--is it [facility]?  What is she getting weekly??  Does she still take lasix and K+? Qod? Daily? Last rx 01/2020. Last saw cards 10/2019, they RF 90d of dilt in 11/2020, but has no upcoming appt.  New COVID booster recommended--can wait another month. Flu shot rec  Shingrix rec  F/u with cards?  Discussed monthly self breast exams and yearly mammograms; at least 30 minutes of aerobic activity at least 5 days/week, weight-bearing exercise at least 2x/week; proper sunscreen use reviewed; healthy diet, including goals of calcium and vitamin D intake and alcohol recommendations (less than or equal to 1 drink/day) reviewed; regular seatbelt use; changing batteries in smoke detectors.  Immunization recommendations discussed--recommend yearly flu shots, Shingrix, new COVID booster (next month).  Colonoscopy recommendations reviewed, UTD, due again 09/2028

## 2021-04-10 ENCOUNTER — Encounter: Payer: Medicare Other | Admitting: Family Medicine

## 2021-04-10 DIAGNOSIS — E785 Hyperlipidemia, unspecified: Secondary | ICD-10-CM

## 2021-04-10 DIAGNOSIS — Z0000 Encounter for general adult medical examination without abnormal findings: Secondary | ICD-10-CM

## 2021-04-10 DIAGNOSIS — I110 Hypertensive heart disease with heart failure: Secondary | ICD-10-CM

## 2021-04-10 DIAGNOSIS — Z853 Personal history of malignant neoplasm of breast: Secondary | ICD-10-CM

## 2021-04-12 ENCOUNTER — Encounter: Payer: Self-pay | Admitting: *Deleted

## 2021-04-26 ENCOUNTER — Other Ambulatory Visit: Payer: Self-pay | Admitting: Cardiology

## 2021-05-13 ENCOUNTER — Other Ambulatory Visit: Payer: Self-pay | Admitting: Cardiology

## 2021-05-20 ENCOUNTER — Other Ambulatory Visit: Payer: Self-pay | Admitting: Cardiology

## 2021-05-29 DIAGNOSIS — M5020 Other cervical disc displacement, unspecified cervical region: Secondary | ICD-10-CM | POA: Diagnosis not present

## 2021-05-29 DIAGNOSIS — M5412 Radiculopathy, cervical region: Secondary | ICD-10-CM | POA: Diagnosis not present

## 2021-05-29 DIAGNOSIS — Z79899 Other long term (current) drug therapy: Secondary | ICD-10-CM | POA: Diagnosis not present

## 2021-06-02 DIAGNOSIS — M5412 Radiculopathy, cervical region: Secondary | ICD-10-CM | POA: Diagnosis not present

## 2021-06-18 ENCOUNTER — Other Ambulatory Visit: Payer: Self-pay | Admitting: Cardiology

## 2021-06-18 NOTE — Progress Notes (Signed)
Cardiology Clinic Note   Patient Name: Olivia [name] Date of Encounter: 06/19/2021  Primary Care Provider:  Rita Ohara, MD Primary Cardiologist:  Glenetta Hew, MD  Patient Profile    Olivia [name] 55 year old female presents the clinic today for follow-up evaluation of her hypertension and chronic diastolic CHF.  Past Medical History    Past Medical History:  Diagnosis Date   Breast cancer, stage 1 (HCC)    Colon polyp    GERD (gastroesophageal reflux disease)    H/O diastolic dysfunction 17/9150   Grade 2 DD. 1.3 x 1.3 cm mass in RA on Echo -->Cardiac MRI 12/04: 1.9 x 1.6 cm mass in the lateral RA cavity. -- Most suggestive of thrombus--  calcified and  lamina and ted suggestive of chronic.; EF from 50% with no scar.   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   Uterine fibroid    s/p embolization   Past Surgical History:  Procedure Laterality Date   BREAST BIOPSY Right 12/31/2012   BREAST BIOPSY Right 09/06/2008   BREAST BIOPSY Right 08/11/2008   BREAST LUMPECTOMY  2010   right   Cardiac MRI  December 2014   1.9 x 1.6 cm mass in the lateral RA cavity. -- Most suggestive of thrombus--  calcified anndd  laammiinnaated suggestive of chronic.; EF from 50-4% with no scar. Trivial effusion   COLONOSCOPY  2010   LUMBAR LAMINECTOMY/DECOMPRESSION MICRODISCECTOMY  2007, 2008   Dr. Hal Neer x 2   POWER PORT     TRANSTHORACIC ECHOCARDIOGRAM  November 2014; March 2016t   a) Mild LVH, EF of roughly 50%. Pseudo-normal LV filling (grade 3 diastolic soft. Mild LA dilation. 1.3 x 1.3 cm mass in right atrium.;; b) Low normal LV  function; grade 2 diastolic dysfunction; calcified right atrial mass; no change compared to 06/11/13.   TUBAL LIGATION  2010   UTERINE FIBROID EMBOLIZATION  2008 or 2009    Allergies  Allergies  Allergen Reactions   Adhesive [Tape] Hives   Morphine Hives    History of Present Illness    Olivia [name] has a PMH of right atrial thrombus which was associated with her PICC line.  She also has a PMH of hypertension, chronic diastolic CHF, dyspnea on exertion, and palpitations.  She was last seen by Dr. Ellyn Hack on 11/23/2019.  During that time she was doing well.  She continued to have swelling in her left leg which was noted to be better.  She reported that the swelling was off and on.  She also continued to have neuropathic type pains from her back surgery.  She had been trying to increase her physical activity and have been adjusting her diet.  She was trying to lose weight slowly and had lost about 10 pounds over the previous year.  She had been going to the obesity clinic and they were helping her with her diet and help in her to become more active.  Blood pressure was well controlled at home in the 569-794 systolic BP range.  Overall she was doing well and denied symptoms of significant chest  pain and pressure at rest and with exertion.  She did note some shortness of breath with rushing upstairs or carrying heavy objects.  She denied wheezing.  She did note on and off episodes of itching sharp chest discomfort in her upper chest which was not associated with activity/exertion.  She presents the clinic today for follow-up evaluation states she feels well.  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  She notes that the coursework is difficult and has increased her stress level.  Initially her blood pressure is 153/78 and on recheck is 118/76.  We reviewed her past medical history and history of high cholesterol.  She is no longer taking rosuvastatin and states that she  has been taking red yeast rice.  We reviewed the importance of high-fiber diet and physical activity.  She has had a sedentary lifestyle since August.  She is also not been eating as well as she was prior to August.  We will plan repeat lipids in February, have her increase her physical activity with a goal of 150 minutes of moderate physical activity per week, have her increase the fiber in her diet, and follow-up in 12 months.  Today she denies chest pain, shortness of breath, lower extremity edema, fatigue, palpitations, melena, hematuria, hemoptysis, diaphoresis, weakness, presyncope, syncope, orthopnea, and PND.   Home Medications    Prior to Admission medications   Medication Sig Start Date End Date Taking? Authorizing Provider  ascorbic acid (VITAMIN C) 250 MG tablet Take 500 mg by mouth daily.    [provider]  Calcium Carbonate-Vitamin D 600-200 MG-UNIT TABS Take 2 tablets by mouth daily.    [provider]  diltiazem (CARDIZEM CD) 180 MG 24 hr capsule TAKE 1 CAPSULE BY MOUTH DAILY 12/19/20   Leonie Man, MD  esomeprazole (NEXIUM) 20 MG capsule Take 20 mg by mouth daily at 12 noon.    [provider]  furosemide (LASIX) 40 MG tablet Take 1 tablet (40 mg total) by mouth daily. NEED OV. 04/26/21   Leonie Man, MD  gabapentin (NEURONTIN) 600 MG tablet Take 600 mg by mouth at bedtime.    [provider]  lidocaine (LIDODERM) 5 % Place 1 patch onto the skin daily. 04/30/14   [provider]  lisinopril (ZESTRIL) 5 MG tablet TAKE 1 TABLET(5 MG) BY MOUTH DAILY 05/15/21   Leonie Man, MD  methocarbamol (ROBAXIN) 500 MG tablet Take 500 mg by mouth as needed for muscle spasms.    [provider]  Molnupiravir 200 MG CAPS Take 4 capsules (800 mg total) by mouth in the morning and at bedtime. 03/17/21   Rita Ohara, MD  Multiple Vitamins-Minerals (MULTIVITAMIN WITH MINERALS) tablet Take 1 tablet by mouth daily.    [provider]  nabumetone (RELAFEN) 500 MG tablet Take 500 mg by mouth 2 (two) times daily.    [provider]  potassium chloride SA (KLOR-CON) 20 MEQ tablet TAKE 1 TABLET BY MOUTH EVERY OTHER DAY 05/22/21   Leonie Man, MD  vitamin E 400 UNIT capsule Take 400 Units by mouth daily.    [provider]    Family History    Family History  Problem Relation Age of Onset   Breast cancer Mother    Cancer Maternal Grandmother        renal cancer   She indicated that her mother is alive. She indicated that her father is alive. She indicated that the  status of her maternal grandmother is unknown. She indicated that her son is alive.  Social History    Social History   Socioeconomic History   Marital status: Married    Spouse name: Not on file   Number of children: Not on file   Years of education: Not on file   Highest education level: Not on file  Occupational History   Not on file  Tobacco Use   Smoking status: Never   Smokeless tobacco: Never  Vaping Use   Vaping Use: Never used  Substance and Sexual Activity   Alcohol use: No   Drug use: No   Sexual activity: Yes  Other Topics Concern   Not on file  Social History Narrative   She is married. Has 1 son, married, age 38 (09/2017).   She walks with a cane sometimes. She is currently disabled.   Never smoked.   She does not exercise regularly, occasional walking   Student for interior design at [employer]   Financial Resource Strain: Not on file  Food Insecurity: Not on file  Transportation Needs: Not on file  Physical Activity: Not on file  Stress: Not on file  Social Connections: Not on file  Intimate Partner Violence: Not on file     Review of Systems    General:  No chills, fever, night sweats or weight changes.  Cardiovascular:  No chest pain, dyspnea on exertion, edema, orthopnea, palpitations, paroxysmal nocturnal dyspnea. Dermatological: No rash,  lesions/masses Respiratory: No cough, dyspnea Urologic: No hematuria, dysuria Abdominal:   No nausea, vomiting, diarrhea, bright red blood per rectum, melena, or hematemesis Neurologic:  No visual changes, wkns, changes in mental status. All other systems reviewed and are otherwise negative except as noted above.  Physical Exam    VS:  BP 118/76   Pulse 80   Ht 5\' 5"  (1.651 m)   Wt 171 lb (77.6 kg)   BMI 28.46 kg/m  , BMI Body mass index is 28.46 kg/m. GEN: Well nourished, well developed, in no acute distress. HEENT: normal. Neck: Supple, no JVD, carotid bruits, or masses. Cardiac: RRR, no murmurs, rubs, or gallops. No clubbing, cyanosis, edema.  Radials/DP/PT 2+ and equal bilaterally.  Respiratory:  Respirations regular and unlabored, clear to auscultation bilaterally. GI: Soft, nontender, nondistended, BS + x 4. MS: no deformity or atrophy. Skin: warm and dry, no rash. Neuro:  Strength and sensation are intact. Psych: Normal affect.  Accessory Clinical Findings    Recent Labs: 09/15/2020: ALT 15; BUN 8; Creatinine, Ser 0.74; Hemoglobin 14.3; Platelets 273; Potassium 3.7; Sodium 140   Recent Lipid Panel    Component Value Date/Time   CHOL 297 (H) 10/14/2017 1208   TRIG 166 (H) 10/14/2017 1208   HDL 47 10/14/2017 1208   CHOLHDL 6.3 (H) 10/14/2017 1208   CHOLHDL 5.7 08/22/2011 0000   VLDL 34 08/22/2011 0000   LDLCALC 217 (H) 10/14/2017 1208    ECG personally reviewed by me today-normal sinus rhythm nonspecific T wave abnormality 80 bpm- No acute changes  Echocardiogram 10/19/2014  Study Conclusions   - Left ventricle: The cavity size was normal. Wall thickness was    normal. Systolic function was normal. The estimated ejection    fraction was in the range of 50% to 55%. Wall motion was normal;    there were no regional wall motion abnormalities. Features are    consistent with a pseudonormal left ventricular filling pattern,    with concomitant  abnormal relaxation  and increased filling    pressure (grade 2 diastolic dysfunction).   Impressions:   - Low normal LV function; grade 2 diastolic dysfunction; calcified    right atrial mass; no change compared to 06/11/13.  Assessment & Plan   1.  Diastolic CHF/right atrial thrombus-euvolemic today.  No increased DOE or activity intolerance.  Echocardiogram 3/16 showed normal LV function with G2 DD and a calcified right atrial mass.  No changes were noted from 06/11/2013 echocardiogram. Continue furosemide, potassium, Heart healthy low-sodium diet-salty 6 given Increase physical activity as tolerated  Essential hypertension-BP today 118/76.  Well-controlled at home. Continue diltiazem, lisinopril, furosemide, potassium Heart healthy low-sodium diet-salty 6 given Increase physical activity as tolerated  Hyperlipidemia- . Goal less than 100. Continue red yeast rice Heart healthy low-sodium high-fiber diet.-Discussed importance of avoiding simple carbohydrates and increasing fiber in her diet Increase physical activity as tolerated Repeat fasting lipids in February  Palpitations-EKG today shows normal sinus rhythm nonspecific T wave abnormality 80 bpm.  Denies recent episodes of increased palpitations or accelerated heartbeat. Continue diltiazem Heart healthy low-sodium diet-salty 6 given Increase physical activity as tolerated Avoid triggers caffeine, chocolate, EtOH, dehydration etc.  Dyspnea on exertion-chronic/stable. Heart healthy low-sodium diet-salty 6 given Increase physical activity as tolerated Continue weight loss  Disposition: Follow-up with Dr. Ellyn Hack or me in 12 months.  Olivia Ng. Linsi Humann NP-C    06/19/2021, 2:36 PM [address] Office [phone] Fax [phone]  Notice: This dictation was prepared with Dragon dictation along with smaller phrase technology. Any transcriptional errors that result from this process are  unintentional and may not be corrected upon review.  I spent 14 minutes examining this patient, reviewing medications, and using patient centered shared decision making involving her cardiac care.  Prior to her visit I spent greater than 20 minutes reviewing her past medical history,  medications, and prior cardiac tests.

## 2021-06-19 ENCOUNTER — Ambulatory Visit: Payer: Medicare Other | Admitting: General Practice

## 2021-06-19 ENCOUNTER — Other Ambulatory Visit: Payer: Self-pay

## 2021-06-19 ENCOUNTER — Encounter: Payer: Self-pay | Admitting: General Practice

## 2021-06-19 VITALS — BP 118/76 | HR 80 | Ht 65.0 in | Wt 171.0 lb

## 2021-06-19 DIAGNOSIS — I5032 Chronic diastolic (congestive) heart failure: Secondary | ICD-10-CM

## 2021-06-19 DIAGNOSIS — R002 Palpitations: Secondary | ICD-10-CM | POA: Diagnosis not present

## 2021-06-19 DIAGNOSIS — R0602 Shortness of breath: Secondary | ICD-10-CM

## 2021-06-19 DIAGNOSIS — E785 Hyperlipidemia, unspecified: Secondary | ICD-10-CM | POA: Diagnosis not present

## 2021-06-19 DIAGNOSIS — I5030 Unspecified diastolic (congestive) heart failure: Secondary | ICD-10-CM | POA: Diagnosis not present

## 2021-06-19 DIAGNOSIS — I110 Hypertensive heart disease with heart failure: Secondary | ICD-10-CM

## 2021-06-19 MED ORDER — DILTIAZEM HCL ER COATED BEADS 180 MG PO CP24: 180.0000 mg | ORAL_CAPSULE | Freq: Every day | ORAL | 3 refills | Status: DC

## 2021-06-19 NOTE — Patient Instructions (Signed)
Medication Instructions:  Your Physician recommend you continue on your current medication as directed.    *If you need a refill on your cardiac medications before your next appointment, please call your pharmacy*   Lab Work: In February, get fasting blood work (lipids).  If you have labs (blood work) drawn today and your tests are completely normal, you will receive your results only by: [place] (if you have MyChart) OR A paper copy in the mail If you have any lab test that is abnormal or we need to change your treatment, we will call you to review the results.   Testing/Procedures: None.   Follow-Up: At [facility], you and your health needs are our priority.  As part of our continuing mission to provide you with exceptional heart care, we have created designated Provider Care Teams.  These Care Teams include your primary Cardiologist (physician) and Advanced Practice Providers (APPs -  Physician Assistants and Nurse Practitioners) who all work together to provide you with the care you need, when you need it.  We recommend signing up for the patient portal called "MyChart".  Sign up information is provided on this After Visit Summary.  MyChart is used to connect with patients for Virtual Visits (Telemedicine).  Patients are able to view lab/test results, encounter notes, upcoming appointments, etc.  Non-urgent messages can be sent to your provider as well.   To learn more about what you can do with MyChart, go to [email or web address].    Your next appointment:   4 month(s)  The format for your next appointment:   In Person  Provider:   Coletta Memos, FNP       Other Instructions: Increase physical activity as tolerated.  High-Fiber Eating Plan Fiber, also called dietary fiber, is a type of carbohydrate. It is found foods such as fruits, vegetables, whole grains, and beans. A high-fiber diet can have many health benefits. Your health care provider may recommend a  high-fiber diet to help: Prevent constipation. Fiber can make your bowel movements more regular. Lower your cholesterol. Relieve the following conditions: Inflammation of veins in the anus (hemorrhoids). Inflammation of specific areas of the digestive tract (uncomplicated diverticulosis). A problem of the large intestine, also called the colon, that sometimes causes pain and diarrhea (irritable bowel syndrome, or IBS). Prevent overeating as part of a weight-loss plan. Prevent heart disease, type 2 diabetes, and certain cancers. What are tips for following this plan? Reading food labels  Check the nutrition facts label on food products for the amount of dietary fiber. Choose foods that have 5 grams of fiber or more per serving. The goals for recommended daily fiber intake include: Men (age 55 or younger): 34-38 g. Men (over age 45): 28-34 g. Women (age 35 or younger): 25-28 g. Women (over age 63): 22-25 g. Your daily fiber goal is _____________ g. Shopping Choose whole fruits and vegetables instead of processed forms, such as apple juice or applesauce. Choose a wide variety of high-fiber foods such as avocados, lentils, oats, and kidney beans. Read the nutrition facts label of the foods you choose. Be aware of foods with added fiber. These foods often have high sugar and sodium amounts per serving. Cooking Use whole-grain flour for baking and cooking. Cook with brown rice instead of white rice. Meal planning Start the day with a breakfast that is high in fiber, such as a cereal that contains 5 g of fiber or more per serving. Eat breads and cereals that are made with  whole-grain flour instead of refined flour or white flour. Eat brown rice, bulgur wheat, or millet instead of white rice. Use beans in place of meat in soups, salads, and pasta dishes. Be sure that half of the grains you eat each day are whole grains. General information You can get the recommended daily intake of dietary  fiber by: Eating a variety of fruits, vegetables, grains, nuts, and beans. Taking a fiber supplement if you are not able to take in enough fiber in your diet. It is better to get fiber through food than from a supplement. Gradually increase how much fiber you consume. If you increase your intake of dietary fiber too quickly, you may have bloating, cramping, or gas. Drink plenty of water to help you digest fiber. Choose high-fiber snacks, such as berries, raw vegetables, nuts, and popcorn. What foods should I eat? Fruits Berries. Pears. Apples. Oranges. Avocado. Prunes and raisins. Dried figs. Vegetables Sweet potatoes. Spinach. Kale. Artichokes. Cabbage. Broccoli. Cauliflower. Green peas. Carrots. Squash. Grains Whole-grain breads. Multigrain cereal. Oats and oatmeal. Brown rice. Barley. Bulgur wheat. [place]. Quinoa. Bran muffins. Popcorn. Rye wafer crackers. Meats and other proteins Navy beans, kidney beans, and pinto beans. Soybeans. Split peas. Lentils. Nuts and seeds. Dairy Fiber-fortified yogurt. Beverages Fiber-fortified soy milk. Fiber-fortified orange juice. Other foods Fiber bars. The items listed above may not be a complete list of recommended foods and beverages. Contact a dietitian for more information. What foods should I avoid? Fruits Fruit juice. Cooked, strained fruit. Vegetables Fried potatoes. Canned vegetables. Well-cooked vegetables. Grains White bread. Pasta made with refined flour. White rice. Meats and other proteins Fatty cuts of meat. Fried chicken or fried fish. Dairy Milk. Yogurt. Cream cheese. Sour cream. Fats and oils Butters. Beverages Soft drinks. Other foods Cakes and pastries. The items listed above may not be a complete list of foods and beverages to avoid. Talk with your dietitian about what choices are best for you. Summary Fiber is a type of carbohydrate. It is found in foods such as fruits, vegetables, whole grains, and beans. A  high-fiber diet has many benefits. It can help to prevent constipation, lower blood cholesterol, aid weight loss, and reduce your risk of heart disease, diabetes, and certain cancers. Increase your intake of fiber gradually. Increasing fiber too quickly may cause cramping, bloating, and gas. Drink plenty of water while you increase the amount of fiber you consume. The best sources of fiber include whole fruits and vegetables, whole grains, nuts, seeds, and beans. This information is not intended to replace advice given to you by your health care provider. Make sure you discuss any questions you have with your health care provider. Document Revised: 11/19/2019 Document Reviewed: 11/19/2019 Elsevier Patient Education  2022 [employer].

## 2021-08-02 DIAGNOSIS — Z6827 Body mass index (BMI) 27.0-27.9, adult: Secondary | ICD-10-CM | POA: Diagnosis not present

## 2021-08-02 DIAGNOSIS — R5383 Other fatigue: Secondary | ICD-10-CM | POA: Diagnosis not present

## 2021-08-08 ENCOUNTER — Other Ambulatory Visit: Payer: Self-pay | Admitting: Cardiology

## 2021-10-09 NOTE — Progress Notes (Unsigned)
Cardiology Clinic Note   Patient Name: Olivia [name] Date of Encounter: 10/09/2021  Primary Care Provider:  Rita Ohara, MD Primary Cardiologist:  Glenetta Hew, MD  Patient Profile    Olivia [name] 56 year old female presents the clinic today for follow-up evaluation of her hypertension and chronic diastolic CHF.  Past Medical History    Past Medical History:  Diagnosis Date   Breast cancer, stage 1 (HCC)    Colon polyp    GERD (gastroesophageal reflux disease)    H/O diastolic dysfunction 18/5631   Grade 2 DD. 1.3 x 1.3 cm mass in RA on Echo -->Cardiac MRI 12/04: 1.9 x 1.6 cm mass in the lateral RA cavity. -- Most suggestive of thrombus--  calcified and  lamina and ted suggestive of chronic.; EF from 50% with no scar.   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   Uterine fibroid    s/p embolization   Past Surgical History:  Procedure Laterality Date   BREAST BIOPSY Right 12/31/2012   BREAST BIOPSY Right 09/06/2008   BREAST BIOPSY Right 08/11/2008   BREAST LUMPECTOMY  2010   right   Cardiac MRI  December 2014   1.9 x 1.6 cm mass in the lateral RA cavity. -- Most suggestive of thrombus--  calcified anndd  laammiinnaated suggestive of chronic.; EF from 50-4% with no scar. Trivial effusion   COLONOSCOPY  2010   LUMBAR LAMINECTOMY/DECOMPRESSION MICRODISCECTOMY  2007, 2008   Dr. Hal Neer x 2   POWER PORT     TRANSTHORACIC ECHOCARDIOGRAM  November 2014; March 2016t   a) Mild LVH, EF of roughly 50%. Pseudo-normal LV filling (grade 3 diastolic soft. Mild LA dilation. 1.3 x 1.3 cm mass in right atrium.;; b) Low normal LV  function; grade 2 diastolic dysfunction; calcified right atrial mass; no change compared to 06/11/13.   TUBAL LIGATION  2010   UTERINE FIBROID EMBOLIZATION  2008 or 2009    Allergies  Allergies  Allergen Reactions   Adhesive [Tape] Hives   Morphine Hives    History of Present Illness    Olivia [name] has a PMH of right atrial thrombus which was associated with her PICC line.  She also has a PMH of hypertension, chronic diastolic CHF, dyspnea on exertion, and palpitations.  She was last seen by Dr. Ellyn Hack on 11/23/2019.  During that time she was doing well.  She continued to have swelling in her left leg which was noted to be better.  She reported that the swelling was off and on.  She also continued to have neuropathic type pains from her back surgery.  She had been trying to increase her physical activity and have been adjusting her diet.  She was trying to lose weight slowly and had lost about 10 pounds over the previous year.  She had been going to the obesity clinic and they were helping her with her diet and help in her to become more active.  Blood pressure was well controlled at home in the 497-026 systolic BP range.  Overall she was doing well and denied symptoms of significant  chest pain and pressure at rest and with exertion.  She did note some shortness of breath with rushing upstairs or carrying heavy objects.  She denied wheezing.  She did note on and off episodes of itching sharp chest discomfort in her upper chest which was not associated with activity/exertion.  She presents ***the clinic today for follow-up evaluation states she feels well.  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  She notes that the coursework is difficult and has increased her stress level.  Initially her blood pressure is 153/78 and on recheck is 118/76.  We reviewed her past medical history and history of high cholesterol.  She is no longer taking rosuvastatin and states that  she has been taking red yeast rice.  We reviewed the importance of high-fiber diet and physical activity.  She has had a sedentary lifestyle since August.  She is also not been eating as well as she was prior to August.  We will plan repeat lipids in February, have her increase her physical activity with a goal of 150 minutes of moderate physical activity per week, have her increase the fiber in her diet, and follow-up in 12 months.  Today she denies chest pain, shortness of breath, lower extremity edema, fatigue, palpitations, melena, hematuria, hemoptysis, diaphoresis, weakness, presyncope, syncope, orthopnea, and PND.   Home Medications    Prior to Admission medications   Medication Sig Start Date End Date Taking? Authorizing Provider  ascorbic acid (VITAMIN C) 250 MG tablet Take 500 mg by mouth daily.    [provider]  Calcium Carbonate-Vitamin D 600-200 MG-UNIT TABS Take 2 tablets by mouth daily.    [provider]  diltiazem (CARDIZEM CD) 180 MG 24 hr capsule TAKE 1 CAPSULE BY MOUTH DAILY 12/19/20   Leonie Man, MD  esomeprazole (NEXIUM) 20 MG capsule Take 20 mg by mouth daily at 12 noon.    [provider]  furosemide (LASIX) 40 MG tablet Take 1 tablet (40 mg total) by mouth daily. NEED OV. 04/26/21   Leonie Man, MD  gabapentin (NEURONTIN) 600 MG tablet Take 600 mg by mouth at bedtime.    [provider]  lidocaine (LIDODERM) 5 % Place 1 patch onto the skin daily. 04/30/14   [provider]  lisinopril (ZESTRIL) 5 MG tablet TAKE 1 TABLET(5 MG) BY MOUTH DAILY 05/15/21   Leonie Man, MD  methocarbamol (ROBAXIN) 500 MG tablet Take 500 mg by mouth as needed for muscle spasms.    [provider]  Molnupiravir 200 MG CAPS Take 4 capsules (800 mg total) by mouth in the morning and at bedtime. 03/17/21   Rita Ohara, MD  Multiple Vitamins-Minerals (MULTIVITAMIN WITH MINERALS) tablet Take 1 tablet by mouth daily.    [provider]  nabumetone (RELAFEN) 500 MG tablet Take 500 mg by mouth 2 (two) times daily.    [provider]  potassium chloride SA (KLOR-CON) 20 MEQ tablet TAKE 1 TABLET BY MOUTH EVERY OTHER DAY 05/22/21   Leonie Man, MD  vitamin E 400 UNIT capsule Take 400 Units by mouth daily.    [provider]    Family History    Family History  Problem Relation Age of Onset   Breast cancer Mother    Cancer Maternal Grandmother        renal cancer   She indicated that her mother is alive. She indicated that her father is alive. She indicated that  the status of her maternal grandmother is unknown. She indicated that her son is alive.   Social History    Social History   Socioeconomic History   Marital status: Married    Spouse name: Not on file   Number of children: Not on file   Years of education: Not on file   Highest education level: Not on file  Occupational History   Not on file  Tobacco Use   Smoking status: Never   Smokeless tobacco: Never  Vaping Use   Vaping Use: Never used  Substance and Sexual Activity   Alcohol use: No   Drug use: No   Sexual activity: Yes  Other Topics Concern   Not on file  Social History Narrative   She is married. Has 1 son, married, age 9 (09/2017).   She walks with a cane sometimes. She is currently disabled.   Never smoked.   She does not exercise regularly, occasional walking   Student for interior design at [employer]   Financial Resource Strain: Not on file  Food Insecurity: Not on file  Transportation Needs: Not on file  Physical Activity: Not on file  Stress: Not on file  Social Connections: Not on file  Intimate Partner Violence: Not on file     Review of Systems    General:  No chills, fever, night sweats or weight changes.  Cardiovascular:  No chest pain, dyspnea on exertion, edema, orthopnea, palpitations, paroxysmal nocturnal dyspnea. Dermatological: No rash,  lesions/masses Respiratory: No cough, dyspnea Urologic: No hematuria, dysuria Abdominal:   No nausea, vomiting, diarrhea, bright red blood per rectum, melena, or hematemesis Neurologic:  No visual changes, wkns, changes in mental status. All other systems reviewed and are otherwise negative except as noted above.  Physical Exam    VS:  There were no vitals taken for this visit. , BMI There is no height or weight on file to calculate BMI. GEN: Well nourished, well developed, in no acute distress. HEENT: normal. Neck: Supple, no JVD, carotid bruits, or masses. Cardiac: RRR, no murmurs, rubs, or gallops. No clubbing, cyanosis, edema.  Radials/DP/PT 2+ and equal bilaterally.  Respiratory:  Respirations regular and unlabored, clear to auscultation bilaterally. GI: Soft, nontender, nondistended, BS + x 4. MS: no deformity or atrophy. Skin: warm and dry, no rash. Neuro:  Strength and sensation are intact. Psych: Normal affect.  Accessory Clinical Findings    Recent Labs: No results found for requested labs within last 8760 hours.   Recent Lipid Panel    Component Value Date/Time   CHOL 297 (H) 10/14/2017 1208   TRIG 166 (H) 10/14/2017 1208   HDL 47 10/14/2017 1208   CHOLHDL 6.3 (H) 10/14/2017 1208   CHOLHDL 5.7 08/22/2011 0000   VLDL 34 08/22/2011 0000   LDLCALC 217 (H) 10/14/2017 1208    ECG personally reviewed by me today-normal sinus rhythm nonspecific T wave abnormality 80 bpm- No acute changes  Echocardiogram 10/19/2014  Study Conclusions   - Left ventricle: The cavity size was normal. Wall thickness was    normal. Systolic function was normal. The estimated ejection    fraction was in the range of 50% to 55%. Wall motion was normal;    there were no regional wall motion abnormalities. Features are    consistent with a pseudonormal left ventricular filling pattern,    with concomitant abnormal relaxation and increased filling    pressure (grade 2 diastolic dysfunction).    Impressions:   -  Low normal LV function; grade 2 diastolic dysfunction; calcified    right atrial mass; no change compared to 06/11/13.  Assessment & Plan   1.  Diastolic CHF/right atrial thrombus-euvolemic today.  No increased DOE or activity intolerance.  Echocardiogram 3/16 showed normal LV function with G2 DD and a calcified right atrial mass.  No changes were noted from 06/11/2013 echocardiogram. Continue furosemide, potassium, Heart healthy low-sodium diet-salty 6 given Increase physical activity as tolerated  Essential hypertension-BP today 118/76.  Well-controlled at home. Continue diltiazem, lisinopril, furosemide, potassium Heart healthy low-sodium diet-salty 6 given Increase physical activity as tolerated  Hyperlipidemia- . Goal less than 100. Continue red yeast rice Heart healthy low-sodium high-fiber diet.-Discussed importance of avoiding simple carbohydrates and increasing fiber in her diet Increase physical activity as tolerated Repeat fasting lipids in February  Palpitations-EKG today shows normal sinus rhythm nonspecific T wave abnormality 80 bpm.  Denies recent episodes of increased palpitations or accelerated heartbeat. Continue diltiazem Heart healthy low-sodium diet-salty 6 given Increase physical activity as tolerated Avoid triggers caffeine, chocolate, EtOH, dehydration etc.  Dyspnea on exertion-chronic/stable. Heart healthy low-sodium diet-salty 6 given Increase physical activity as tolerated Continue weight loss  Disposition: Follow-up with Dr. Ellyn Hack or me in 12 months.  Jossie Ng. Jaterrius Ricketson NP-C    10/09/2021, 7:06 AM [employer] [address] Office [phone] Fax [phone]  Notice: This dictation was prepared with Dragon dictation along with smaller phrase technology. Any transcriptional errors that result from this process are unintentional and may not be corrected upon review.  I spent 14 minutes  examining this patient, reviewing medications, and using patient centered shared decision making involving her cardiac care.  Prior to her visit I spent greater than 20 minutes reviewing her past medical history,  medications, and prior cardiac tests.

## 2021-10-12 ENCOUNTER — Ambulatory Visit: Payer: Medicare Other | Admitting: General Practice

## 2021-11-28 NOTE — Progress Notes (Signed)
? ?Cardiology Clinic Note  ? ?Patient Name: Olivia [name] ?Date of Encounter: 11/30/2021 ? ?Primary Care Provider:  Rita Ohara, MD ?Primary Cardiologist:  Glenetta Hew, MD ? ?Patient Profile  ?  ?Olivia [name] 56 year old female presents the clinic today for follow-up evaluation of her hypertension and chronic diastolic CHF. ? ?Past Medical History  ?  ?Past Medical History:  ?Diagnosis Date  ? Breast cancer, stage 1 ([place])   ? Colon polyp   ? GERD (gastroesophageal reflux disease)   ? H/O diastolic dysfunction 67/6720  ? Grade 2 DD. 1.3 x 1.3 cm mass in RA on Echo -->Cardiac MRI 12/04: 1.9 x 1.6 cm mass in the lateral RA cavity. -- Most suggestive of thrombus--  calcified and  lamina and ted suggestive of chronic.; EF from 50% with no scar.  ? History of breast cancer 2010  ? invasive ductal R breast; s/p lumpectomy, radiation and chemo  ? Hypertension   ? Lumbar degenerative disc disease   ? Obesity   ? Personal history of chemotherapy   ? Personal history of radiation therapy   ? 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 f/u Echo 09/2014: Low normal LV function; grade 2 diastolic dysfunction; calcified right atrial mass; no change compared to 06/11/13.  ? Uterine fibroid   ? s/p embolization  ? ?Past Surgical History:  ?Procedure Laterality Date  ? BREAST BIOPSY Right 12/31/2012  ? BREAST BIOPSY Right 09/06/2008  ? BREAST BIOPSY Right 08/11/2008  ? BREAST LUMPECTOMY  2010  ? right  ? Cardiac MRI  December 2014  ? 1.9 x 1.6 cm mass in the lateral RA cavity. -- Most suggestive of thrombus--  calcified anndd  laammiinnaated suggestive of chronic.; EF from 50-4% with no scar. Trivial effusion  ? COLONOSCOPY  2010  ? LUMBAR LAMINECTOMY/DECOMPRESSION MICRODISCECTOMY  2007, 2008  ? Dr. Hal Neer x 2  ? POWER PORT    ? TRANSTHORACIC ECHOCARDIOGRAM  November 2014; March 2016t  ? a) Mild LVH, EF of roughly 50%. Pseudo-normal LV filling (grade 3 diastolic soft. Mild LA dilation. 1.3 x 1.3 cm mass in right atrium.;; b) Low normal LV  function; grade 2 diastolic dysfunction; calcified right atrial mass; no change compared to 06/11/13.  ? TUBAL LIGATION  2010  ? UTERINE FIBROID EMBOLIZATION  2008 or 2009  ? ? ?Allergies ? ?Allergies  ?Allergen Reactions  ? Adhesive [Tape] Hives  ? Morphine Hives  ? ? ?History of Present Illness  ?  ?Olivia [name] has a PMH of right atrial thrombus which was associated with her PICC line.  She also has a PMH of hypertension, chronic diastolic CHF, dyspnea on exertion, and palpitations. ? ?She was last seen by Dr. Ellyn Hack on 11/23/2019.  During that time she was doing well.  She continued to have swelling in her left leg which was noted to be better.  She reported that the swelling was off and on.  She also continued to have neuropathic type pains from her back surgery.  She had been trying to increase her physical activity and have been adjusting her diet.  She was trying to lose weight slowly and had lost about 10 pounds over the previous year.  She had been going to the obesity clinic and they were helping her with her diet and help in her to become more active.  Blood pressure was well controlled at home in the 947-096 systolic BP range.  Overall she was doing well and denied symptoms of significant chest  pain and pressure at rest and with exertion.  She did note some shortness of breath with rushing upstairs or carrying heavy objects.  She denied wheezing.  She did note on and off episodes of itching sharp chest discomfort in her upper chest which was not associated with activity/exertion. ? ?She presented to the clinic 06/19/2021 for follow-up evaluation stated she felt well.  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  She noted that the coursework was difficult and had increased her stress level.  Initially her blood pressure was 153/78 and on recheck was 118/76.  We reviewed her past medical history and history of high cholesterol.  She was no longer taking rosuvastatin and  stated that she had been taking red yeast rice.  We reviewed the importance of high-fiber diet and physical activity.  She  had a sedentary lifestyle since August.  She was also not  eating as well as she was prior to August.  We  planned repeat lipids in February, I asked her to increase her physical activity with a goal of 150 minutes of moderate physical activity per week, asked her to increase the fiber in her diet, and planned follow-up in 12 months. ? ?She presents to the clinic today for follow-up evaluation and states she feels well.  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  She reports that this was not for her.  Her blood pressure is well controlled at home 120s over 70s-80s.  She continues to be physically active walking on her treadmill and helping care for her grandchildren.  She denies palpitations.  She does report that she occasionally has espresso.  She feels she needs some caffeine to help her with her studying.  I will give her the sleep hygiene instructions, have her have lipids and BMP drawn at her PCP, increase her physical activity as tolerated, and follow-up in 12 months. ? ?Today she denies chest pain, shortness of breath, lower extremity edema, fatigue, palpitations, melena, hematuria, hemoptysis, diaphoresis, weakness, presyncope, syncope, orthopnea, and PND. ? ? ?Home Medications  ?  ?Prior to Admission medications   ?Medication Sig Start Date End Date Taking? Authorizing Provider  ?ascorbic acid (VITAMIN C) 250 MG tablet Take 500 mg by mouth daily.    [provider]  ?Calcium Carbonate-Vitamin D 600-200 MG-UNIT TABS Take 2 tablets by mouth daily.    [provider]  ?diltiazem (CARDIZEM CD) 180 MG 24 hr capsule TAKE 1 CAPSULE BY MOUTH DAILY 12/19/20   Leonie Man, MD  ?esomeprazole (NEXIUM) 20 MG capsule Take 20 mg by mouth daily at 12 noon.    [provider]   ?furosemide (LASIX) 40 MG tablet Take 1 tablet (40 mg total) by mouth daily. NEED OV. 04/26/21   Leonie Man, MD  ?gabapentin (NEURONTIN) 600 MG tablet Take 600 mg by mouth at bedtime.    [provider]  ?lidocaine (LIDODERM) 5 % Place 1 patch onto the skin daily. 04/30/14   [provider]  ?lisinopril (ZESTRIL) 5 MG tablet TAKE 1 TABLET(5 MG) BY MOUTH DAILY 05/15/21   Leonie Man, MD  ?methocarbamol (ROBAXIN) 500 MG tablet Take 500 mg by mouth as needed for muscle spasms.    [provider]  ?Molnupiravir 200 MG CAPS Take 4 capsules (800 mg total) by mouth in the morning and at bedtime. 03/17/21   Rita Ohara,  MD  ?Multiple Vitamins-Minerals (MULTIVITAMIN WITH MINERALS) tablet Take 1 tablet by mouth daily.    [provider]  ?nabumetone (RELAFEN) 500 MG tablet Take 500 mg by mouth 2 (two) times daily.    [provider]  ?potassium chloride SA (KLOR-CON) 20 MEQ tablet TAKE 1 TABLET BY MOUTH EVERY OTHER DAY 05/22/21   Leonie Man, MD  ?vitamin E 400 UNIT capsule Take 400 Units by mouth daily.    [provider]  ? ? ?Family History  ?  ?Family History  ?Problem Relation Age of Onset  ? Breast cancer Mother   ? Cancer Maternal Grandmother   ?     renal cancer  ? ?She indicated that her mother is alive. She indicated that her father is alive. She indicated that the status of her maternal grandmother is unknown. She indicated that her son is alive. ? ? ?Social History  ?  ?Social History  ? ?Socioeconomic History  ? Marital status: Married  ?  Spouse name: Not on file  ? Number of children: Not on file  ? Years of education: Not on file  ? Highest education level: Not on file  ?Occupational History  ? Not on file  ?Tobacco Use  ? Smoking status: Never  ? Smokeless tobacco: Never  ?Vaping Use  ? Vaping Use: Never used  ?Substance and Sexual Activity  ? Alcohol use: No  ? Drug use: No  ? Sexual activity: Yes  ?Other Topics Concern  ? Not on file   ?Social History Narrative  ? She is married. Has 1 son, married, age 78 (09/2017).  ? She walks with a cane sometimes. She is currently disabled.  ? Never smoked.  ? She does not exercise regularly, occasional walking  ?

## 2021-11-30 ENCOUNTER — Ambulatory Visit: Payer: Medicare Other | Admitting: General Practice

## 2021-11-30 ENCOUNTER — Encounter: Payer: Self-pay | Admitting: General Practice

## 2021-11-30 VITALS — BP 132/96 | HR 73 | Ht 65.0 in | Wt 174.4 lb

## 2021-11-30 DIAGNOSIS — I10 Essential (primary) hypertension: Secondary | ICD-10-CM

## 2021-11-30 DIAGNOSIS — I5030 Unspecified diastolic (congestive) heart failure: Secondary | ICD-10-CM

## 2021-11-30 DIAGNOSIS — E785 Hyperlipidemia, unspecified: Secondary | ICD-10-CM | POA: Diagnosis not present

## 2021-11-30 DIAGNOSIS — R002 Palpitations: Secondary | ICD-10-CM

## 2021-11-30 DIAGNOSIS — R0602 Shortness of breath: Secondary | ICD-10-CM

## 2021-11-30 NOTE — Patient Instructions (Addendum)
Medication Instructions:  ?Your Physician recommend you continue on your current medication as directed.   ? ?*If you need a refill on your cardiac medications before your next appointment, please call your pharmacy* ? ? ?Lab Work: ?To be done at your PCP and sent to [place]. (Fasting lipids and BMP) ?If you have labs (blood work) drawn today and your tests are completely normal, you will receive your results only by: ?MyChart Message (if you have MyChart) OR ?A paper copy in the mail ?If you have any lab test that is abnormal or we need to change your treatment, we will call you to review the results. ? ?Follow-Up: ?At [facility], you and your health needs are our priority.  As part of our continuing mission to provide you with exceptional heart care, we have created designated Provider Care Teams.  These Care Teams include your primary Cardiologist (physician) and Advanced Practice Providers (APPs -  Physician Assistants and Nurse Practitioners) who all work together to provide you with the care you need, when you need it. ? ?We recommend signing up for the patient portal called "MyChart".  Sign up information is provided on this After Visit Summary.  MyChart is used to connect with patients for Virtual Visits (Telemedicine).  Patients are able to view lab/test results, encounter notes, upcoming appointments, etc.  Non-urgent messages can be sent to your provider as well.   ?To learn more about what you can do with MyChart, go to [email or web address].   ? ?Your next appointment:   ?1 year(s) ? ?The format for your next appointment:   ?In Person ? ?Provider:   ?Glenetta Hew, MD or Coletta Memos, [place] ? ?Other Instructions ?Avoid afib triggers ( avoid caffeine, alcohol, dehydration, chocolate) ?Sleep hygiene per handout ?

## 2021-12-16 ENCOUNTER — Encounter: Payer: Self-pay | Admitting: Family Medicine

## 2021-12-16 NOTE — Progress Notes (Unsigned)
Chief Complaint  Patient presents with   Medicare Wellness    Fasting AWV/CPE-no pap sees Dr. Garwin Brothers. Dr. Allison Quarry PA wanted her to have lipids done while she is here. Does not want covid booster. Has not had flu or shingrix. Will take Tdap. Takes lasix and K every other day. No other concerns.    Olivia [name] is a 56 y.o. female who presents for a complete physical.  She is under the care of Dr. Garwin Brothers for her GYN exams (no notes available).    She was seen (virtually) when she had COVID in 02/2021, otherwise hasn't been seen here in a while, seen by mid-levels in the last 2 years for UTI's.   Chart reviewed--had steroid injections to L shoulder for adhesive capsulitis in Nov and Dec 2021. This has resolved. She still goes to [facility], but no longer getting any bloodwork.  She just gets B12 shots from them once a week. She can't recall if she had labs with them earlier this year.  Lab Results  Component Value Date   CHOL 297 (H) 10/14/2017   HDL 47 10/14/2017   LDLCALC 217 (H) 10/14/2017   TRIG 166 (H) 10/14/2017   CHOLHDL 6.3 (H) 10/14/2017     She has hypertension, hypertensive heart disease with diastolic dysfunction noted on echo, palpitations, and hyperlipidemia. She is under the care of Dr. Ellyn Hack; last saw cardiology for visit in 11/2021.  She remains on diltazem and lisinopril. She stopped rosuvastatin and instead was taking red yeast rice.  Lipids were to be repeated in Feb (not done). Per their notes, requesting BMP and lipids be drawn today. She takes furosemide and potassium every other day.  She gets some swelling at the end of the day, relieved by elevating her legs. Doesn't notice a significant difference on the days she takes lasix or not, more related to her activity.  Strong in her faith prayed abou tit and was told not to take rosuvastatin.  Willing to try other statin  UPDATE She denies chest pain, palpitations, DOE,.        Immunization History  Administered  Date(s) Administered   Influenza-Unspecified 06/07/2005   Moderna Sars-Covid-2 Vaccination 11/14/2019, 12/12/2019   Td 03/29/1993, 04/29/2003   Tdap 12/23/2011    Last Pap smear: 07/2020 normal no high risk HPV Last mammogram: 03/2021 Last colonoscopy:  09/2018 Dr. Collene Mares, normal. 10 yr f/u Last DEXA: 12/2013, normal Dentist: sees dentist, periodontist and orthodontist Ophtho: a few years, plans to schedule Exercise:   Exercising depends on her pain level.  Not much lately. Walks on the treadmill usually 3x/week. No weight-bearing exercise.  Hep C screen done 11/2008   PMH, PSH, SH and FH were reviewed and updated  ***NEXTIUM IS OTC   Outpatient Encounter Medications as of 12/18/2021  Medication Sig Note   ascorbic acid (VITAMIN C) 250 MG tablet Take 500 mg by mouth daily.    Calcium Carbonate-Vitamin D 600-200 MG-UNIT TABS Take 2 tablets by mouth daily.    diltiazem (CARDIZEM CD) 180 MG 24 hr capsule Take 1 capsule (180 mg total) by mouth daily.    esomeprazole (NEXIUM) 20 MG capsule Take 20 mg by mouth daily at 12 noon.    furosemide (LASIX) 40 MG tablet Take 1 tablet (40 mg total) by mouth daily. NEED OV. (Patient taking differently: Take 40 mg by mouth every other day. NEED OV.)    gabapentin (NEURONTIN) 600 MG tablet Take 600 mg by mouth at bedtime.  lisinopril (ZESTRIL) 5 MG tablet TAKE 1 TABLET(5 MG) BY MOUTH DAILY    methocarbamol (ROBAXIN) 500 MG tablet Take 500 mg by mouth as needed for muscle spasms.    Multiple Vitamins-Minerals (MULTIVITAMIN WITH MINERALS) tablet Take 1 tablet by mouth daily.    nabumetone (RELAFEN) 500 MG tablet Take 500 mg by mouth 2 (two) times daily.    potassium chloride SA (KLOR-CON) 20 MEQ tablet TAKE 1 TABLET BY MOUTH EVERY OTHER DAY 12/18/2021: Takes every other day   Red Yeast Rice Extract (RED YEAST RICE PO) Take by mouth daily. 12/18/2021: Not sure of strength   Turmeric (QC TUMERIC COMPLEX PO) Take by mouth daily.    lidocaine (LIDODERM) 5 %  Place 1 patch onto the skin as needed. (Patient not taking: Reported on 12/18/2021) 12/18/2021: Ran out   vitamin E 400 UNIT capsule Take 400 Units by mouth daily.    No facility-administered encounter medications on file as of 12/18/2021.   Allergies  Allergen Reactions   Adhesive [Tape] Hives   Morphine Hives     ROS: The patient denies anorexia, fever, weight changes, headaches,  vision changes, decreased hearing, ear pain, sore throat, breast concerns, chest pain, palpitations, dizziness, syncope, dyspnea on exertion, cough, swelling, nausea, vomiting, diarrhea, constipation, abdominal pain, melena, hematochezia, indigestion/heartburn, hematuria, incontinence, dysuria, vaginal bleeding, discharge, odor or itch, genital lesions, joint pains, numbness, tingling, weakness, tremor, suspicious skin lesions, depression, anxiety, abnormal bleeding/bruising, or enlarged lymph nodes. *** update all   No cycles since 2010. Denies hot flashes Chronic pain--back, all over body and muscle pains (prior to starting any statin). Not much in the way of joint pains, mainly muscular. She takes nexium--reflux flared with chemo.  Tried weaning off, gets some recurrent. She is on OTC nexium Occ mild allergy symptoms.  Edema?--end of day     PHYSICAL EXAM:  BP 120/78   Pulse 64   Ht 5' 4.5" (1.638 m)   Wt 176 lb 3.2 oz (79.9 kg)   BMI 29.78 kg/m   Wt Readings from Last 3 Encounters:  12/18/21 176 lb 3.2 oz (79.9 kg)  11/30/21 174 lb 6.4 oz (79.1 kg)  06/19/21 171 lb (77.6 kg)   (02/2021 was home weight, virtual visit) 07/2018 wt 202#   General Appearance:    Alert, cooperative, no distress, appears stated age  Head:    Normocephalic, without obvious abnormality, atraumatic  Eyes:    PERRL, conjunctiva/corneas clear, EOM's intact, fundi    benign  Ears:    Normal TM's and external ear canals  Nose:   Not examined, wearing mask due to COVID-19 pandemic  Throat:   Not examined, wearing mask due  to COVID-19 pandemic  Neck:   Supple, no lymphadenopathy;  thyroid:  no enlargement/ tenderness/nodules; no carotid bruit or JVD  Back:    Spine nontender, no curvature, ROM normal, no CVA     tenderness  Lungs:     Clear to auscultation bilaterally without wheezes, rales or     ronchi; respirations unlabored  Chest Wall:    No tenderness or deformity   Heart:    Regular rate and rhythm, S1 and S2 normal, no murmur, rub or gallop  Breast Exam:    Deferred to GYN  Abdomen:     Soft, non-tender, nondistended, normoactive bowel sounds, no masses, no hepatosplenomegaly  Genitalia:    Deferred to GYN       Extremities:   No clubbing, cyanosis or edema  Pulses:   2+  and symmetric all extremities  Skin:   Skin color, texture, turgor normal, no rashes or lesions  Lymph nodes:   No abnormal cervical or supraclavicular nodes  Neurologic:   Normal strength, sensation and gait; reflexes 2+ and symmetric throughout                                Psych:   Normal mood, affect, hygiene and grooming.                   Update nose/throat Update all     ASSESSMENT/PLAN:     TdaP--Medicare  Does she need MMR or MMR titers?--found paperwork; got titers elsewhere Offer COVID booster (has she had any?) Document if any flu shots Any Shingrix??   Lipids, c-met See if other labs done by [facility] before checking others (CBC, ?d, thyroid, etc) ?HIV  Does she still take lasix and K+? Qod? Daily? Last lasix rx #30 with 2 RF 03/2021, #90 of K+ in 04/2021 (stated appt needed, has been seen)      Discussed monthly self breast exams and yearly mammograms; at least 30 minutes of aerobic activity at least 5 days/week, weight-bearing exercise at least 2x/week; proper sunscreen use reviewed; healthy diet, including goals of calcium and vitamin D intake and alcohol recommendations (less than or equal to 1 drink/day) reviewed; regular seatbelt use; changing batteries in smoke detectors.  Immunization recommendations  discussed-- TdaP given recommend yearly flu shots (declines?) Rec Shingrix Bivalent COVID booster rec  Colonoscopy recommendations reviewed, UTD, due again 09/2028

## 2021-12-16 NOTE — Patient Instructions (Addendum)
  HEALTH MAINTENANCE RECOMMENDATIONS:  It is recommended that you get at least 30 minutes of aerobic exercise at least 5 days/week (for weight loss, you may need as much as 60-90 minutes). This can be any activity that gets your heart rate up. This can be divided in 10-15 minute intervals if needed, but try and build up your endurance at least once a week.  Weight bearing exercise is also recommended twice weekly.  Eat a healthy diet with lots of vegetables, fruits and fiber.  "Colorful" foods have a lot of vitamins (ie green vegetables, tomatoes, red peppers, etc).  Limit sweet tea, regular sodas and alcoholic beverages, all of which has a lot of calories and sugar.  Up to 1 alcoholic drink daily may be beneficial for women (unless trying to lose weight, watch sugars).  Drink a lot of water.  Calcium recommendations are 1200-1500 mg daily (1500 mg for postmenopausal women or women without ovaries), and vitamin D 1000 IU daily.  This should be obtained from diet and/or supplements (vitamins), and calcium should not be taken all at once, but in divided doses.  Monthly self breast exams and yearly mammograms for women over the age of 6 is recommended.  Sunscreen of at least SPF 30 should be used on all sun-exposed parts of the skin when outside between the hours of 10 am and 4 pm (not just when at beach or pool, but even with exercise, golf, tennis, and yard work!)  Use a sunscreen that says "broad spectrum" so it covers both UVA and UVB rays, and make sure to reapply every 1-2 hours.  Remember to change the batteries in your smoke detectors when changing your clock times in the spring and fall. Carbon monoxide detectors are recommended for your home.  Use your seat belt every time you are in a car, and please drive safely and not be distracted with cell phones and texting while driving.  I recommend getting the new shingles vaccine (Shingrix). Since you have Medicare, you will need to get this from  the pharmacy, as it is covered by Part D. If you don't have Part D, you will likely need to pay out of pocket, and might need to check prices ([employer]/[employer] are likely the cheapest).  This is a series of 2 injections, spaced 2 months apart.   This should be separated from other vaccines by at least 2 weeks.  You are due for a tetanus booster (TdaP).  You will also need to get this from the pharmacy.    Yearly flu shots are recommended.  I recommend you get an updated COVID booster when it becomes available in the Fall.  If your cholesterol comes back very high, we discussed starting a statin medication . You expressed not wanting to take the rosuvastatin.  Atorvastatin would be my choice. We discussed taking Coenzyme Q10 daily if you develop any muscle aches (vs starting it a week before the statin, and if you have no muscle aches, you can see if they develop upon stopping the CoQ10 supplement.  Schedule a routine eye exam.  Please get [place] all lab results from other labs (including your MMR titers).

## 2021-12-18 ENCOUNTER — Encounter: Payer: Self-pay | Admitting: Family Medicine

## 2021-12-18 ENCOUNTER — Ambulatory Visit (INDEPENDENT_AMBULATORY_CARE_PROVIDER_SITE_OTHER): Payer: Medicare Other | Admitting: Family Medicine

## 2021-12-18 VITALS — BP 120/78 | HR 64 | Ht 64.5 in | Wt 176.2 lb

## 2021-12-18 DIAGNOSIS — Z23 Encounter for immunization: Secondary | ICD-10-CM

## 2021-12-18 DIAGNOSIS — I110 Hypertensive heart disease with heart failure: Secondary | ICD-10-CM

## 2021-12-18 DIAGNOSIS — Z114 Encounter for screening for human immunodeficiency virus [HIV]: Secondary | ICD-10-CM | POA: Diagnosis not present

## 2021-12-18 DIAGNOSIS — Z853 Personal history of malignant neoplasm of breast: Secondary | ICD-10-CM

## 2021-12-18 DIAGNOSIS — Z5181 Encounter for therapeutic drug level monitoring: Secondary | ICD-10-CM | POA: Diagnosis not present

## 2021-12-18 DIAGNOSIS — E785 Hyperlipidemia, unspecified: Secondary | ICD-10-CM | POA: Diagnosis not present

## 2021-12-18 DIAGNOSIS — I10 Essential (primary) hypertension: Secondary | ICD-10-CM

## 2021-12-18 DIAGNOSIS — Z7185 Encounter for immunization safety counseling: Secondary | ICD-10-CM | POA: Diagnosis not present

## 2021-12-18 DIAGNOSIS — Z0000 Encounter for general adult medical examination without abnormal findings: Secondary | ICD-10-CM | POA: Diagnosis not present

## 2021-12-18 DIAGNOSIS — I5032 Chronic diastolic (congestive) heart failure: Secondary | ICD-10-CM

## 2021-12-19 ENCOUNTER — Encounter: Payer: Self-pay | Admitting: Family Medicine

## 2021-12-19 LAB — COMPREHENSIVE METABOLIC PANEL
ALT: 12 IU/L (ref 0–32)
AST: 15 IU/L (ref 0–40)
Albumin/Globulin Ratio: 1.4 (ref 1.2–2.2)
Albumin: 4.6 g/dL (ref 3.8–4.9)
Alkaline Phosphatase: 78 IU/L (ref 44–121)
BUN/Creatinine Ratio: 14 (ref 9–23)
BUN: 12 mg/dL (ref 6–24)
Bilirubin Total: 0.3 mg/dL (ref 0.0–1.2)
CO2: 26 mmol/L (ref 20–29)
Calcium: 9.8 mg/dL (ref 8.7–10.2)
Chloride: 104 mmol/L (ref 96–106)
Creatinine, Ser: 0.83 mg/dL (ref 0.57–1.00)
Globulin, Total: 3.2 g/dL (ref 1.5–4.5)
Glucose: 88 mg/dL (ref 70–99)
Potassium: 4.2 mmol/L (ref 3.5–5.2)
Sodium: 143 mmol/L (ref 134–144)
Total Protein: 7.8 g/dL (ref 6.0–8.5)
eGFR: 83 mL/min/{1.73_m2} (ref >59)

## 2021-12-19 LAB — LIPID PANEL
Chol/HDL Ratio: 4.1 ratio (ref 0.0–4.4)
Cholesterol, Total: 241 mg/dL — ABNORMAL HIGH (ref 100–199)
HDL: 59 mg/dL (ref >39)
LDL Chol Calc (NIH): 164 mg/dL — ABNORMAL HIGH (ref 0–99)
Triglycerides: 105 mg/dL (ref 0–149)
VLDL Cholesterol Cal: 18 mg/dL (ref 5–40)

## 2021-12-19 LAB — CBC WITH DIFFERENTIAL/PLATELET
Basophils Absolute: 0 10*3/uL (ref 0.0–0.2)
Basos: 1 %
EOS (ABSOLUTE): 0.1 10*3/uL (ref 0.0–0.4)
Eos: 2 %
Hematocrit: 41.2 % (ref 34.0–46.6)
Hemoglobin: 14 g/dL (ref 11.1–15.9)
Immature Grans (Abs): 0 10*3/uL (ref 0.0–0.1)
Immature Granulocytes: 0 %
Lymphocytes Absolute: 2 10*3/uL (ref 0.7–3.1)
Lymphs: 45 %
MCH: 31.2 pg (ref 26.6–33.0)
MCHC: 34 g/dL (ref 31.5–35.7)
MCV: 92 fL (ref 79–97)
Monocytes Absolute: 0.3 10*3/uL (ref 0.1–0.9)
Monocytes: 6 %
Neutrophils Absolute: 1.9 10*3/uL (ref 1.4–7.0)
Neutrophils: 46 %
Platelets: 293 10*3/uL (ref 150–450)
RBC: 4.49 x10E6/uL (ref 3.77–5.28)
RDW: 12.3 % (ref 11.7–15.4)
WBC: 4.3 10*3/uL (ref 3.4–10.8)

## 2021-12-19 LAB — HIV ANTIBODY (ROUTINE TESTING W REFLEX): HIV Screen 4th Generation wRfx: NONREACTIVE

## 2021-12-21 NOTE — Progress Notes (Signed)
Please advise pt that Dr. Ellyn Hack agrees with her starting atorvastatin.  Please send in rx for '20mg'$ . #90, no refill.  She needs to be set up for (and lab orders entered) for lipids and LFT's in 2-3 mos, fasting lab visit only.  Remind her to review the notes from visit regarding the recommendations for coenzyme Q10 to help with any side effects, and to contact us if she has any issues or problems related to the medications.

## 2021-12-29 ENCOUNTER — Other Ambulatory Visit: Payer: Self-pay | Admitting: Cardiology

## 2022-01-02 ENCOUNTER — Other Ambulatory Visit: Payer: Self-pay | Admitting: *Deleted

## 2022-01-02 DIAGNOSIS — E785 Hyperlipidemia, unspecified: Secondary | ICD-10-CM

## 2022-01-02 DIAGNOSIS — Z79899 Other long term (current) drug therapy: Secondary | ICD-10-CM

## 2022-01-02 MED ORDER — ATORVASTATIN CALCIUM 20 MG PO TABS: 20.0000 mg | ORAL_TABLET | Freq: Every day | ORAL | 0 refills | Status: DC

## 2022-01-29 ENCOUNTER — Other Ambulatory Visit: Payer: Self-pay | Admitting: Cardiology

## 2022-01-30 NOTE — Progress Notes (Signed)
Since I have not seen her in > 2 years - would defer to PCP [facility]

## 2022-01-30 NOTE — Progress Notes (Signed)
I am ok with atorva - but probably will not get [place] to goal.  [facility]

## 2022-02-08 ENCOUNTER — Ambulatory Visit (INDEPENDENT_AMBULATORY_CARE_PROVIDER_SITE_OTHER): Payer: Medicare Other | Admitting: Medical

## 2022-02-08 ENCOUNTER — Encounter: Payer: Self-pay | Admitting: Medical

## 2022-02-08 VITALS — BP 120/80 | HR 85 | Temp 98.3°F | Wt 175.6 lb

## 2022-02-08 DIAGNOSIS — J029 Acute pharyngitis, unspecified: Secondary | ICD-10-CM | POA: Diagnosis not present

## 2022-02-08 DIAGNOSIS — H9209 Otalgia, unspecified ear: Secondary | ICD-10-CM

## 2022-02-08 LAB — POCT RAPID STREP A (OFFICE): Rapid Strep A Screen: NEGATIVE

## 2022-02-08 MED ORDER — AMOXICILLIN 875 MG PO TABS: 875.0000 mg | ORAL_TABLET | Freq: Two times a day (BID) | ORAL | 0 refills | Status: AC

## 2022-02-08 NOTE — Progress Notes (Signed)
Subjective:  Olivia [name] is a 56 y.o. female who presents for Chief Complaint  Patient presents with   Acute Visit    Sore throat and right ear pain. She did notice white patches in the back of throat.    Here for sore throat and ear pain.  She had a periodontist visit 2 days ago for her routine cleanings.  They used a raspberry numbing gel which was different than the normal numbing gel to use on her.  She does have this numbing gel applied topically to her gums when she does the cleaning regularly.  However the next day after her dental visit she felt irritated in the throat.  She now notes 2 days of some sore throat, ear pressure ear pain, but no other symptoms.  No fever, no sinus pressure, no nausea vomiting no body aches or chills.  No sick contacts.  She does have some grandchildren but none of them have been sick.  No concern for mono.  Married.  No other aggravating or relieving factors.    No other c/o.  The following portions of the patient's history were reviewed and updated as appropriate: allergies, current medications, past family history, past medical history, past social history, past surgical history and problem list.  ROS Otherwise as in subjective above  Objective: BP 120/80   Pulse 85   Temp 98.3 F (36.8 C)   Wt 175 lb 9.6 oz (79.7 kg)   SpO2 98%   BMI 29.68 kg/m   General appearance: alert, no distress, well developed, well nourished HEENT: normocephalic, sclerae anicteric, conjunctiva pink and moist, TMs bilateral with retraction and slight erythema, nares patent, no discharge or erythema Oral cavity: MMM, erythema of the pharynx and right tonsil swollen with some white exudate Neck: supple, no lymphadenopathy, no thyromegaly, no masses Heart: RRR, normal S1, S2, no murmurs Lungs: CTA bilaterally, no wheezes, rhonchi, or rales    Assessment: Encounter Diagnoses  Name Primary?   Otalgia, unspecified laterality Yes   Sore throat      Plan: We  discussed her symptoms and concerns.  No sign of allergic reaction but there is obvious findings of what appears to be ear infection and tonsillitis.  Begin the medicine below.  Rest, hydrate well, can use salt water gargles.  If not much improved in the next 2 to 3 days then call recheck  Olivia [name] was seen today for acute visit.  Diagnoses and all orders for this visit:  Otalgia, unspecified laterality  Sore throat -     Rapid Strep A  Other orders -     amoxicillin (AMOXIL) 875 MG tablet; Take 1 tablet (875 mg total) by mouth 2 (two) times daily for 10 days.    Follow up: As needed

## 2022-02-23 IMAGING — DX DG CHEST 2V
2 series · 2 of 2 positions shown · non-contrast
Comparison: 01/16/2018.  CT

CLINICAL DATA: Chest pain.

EXAM:
CHEST - 2 VIEW

[w chest pa]
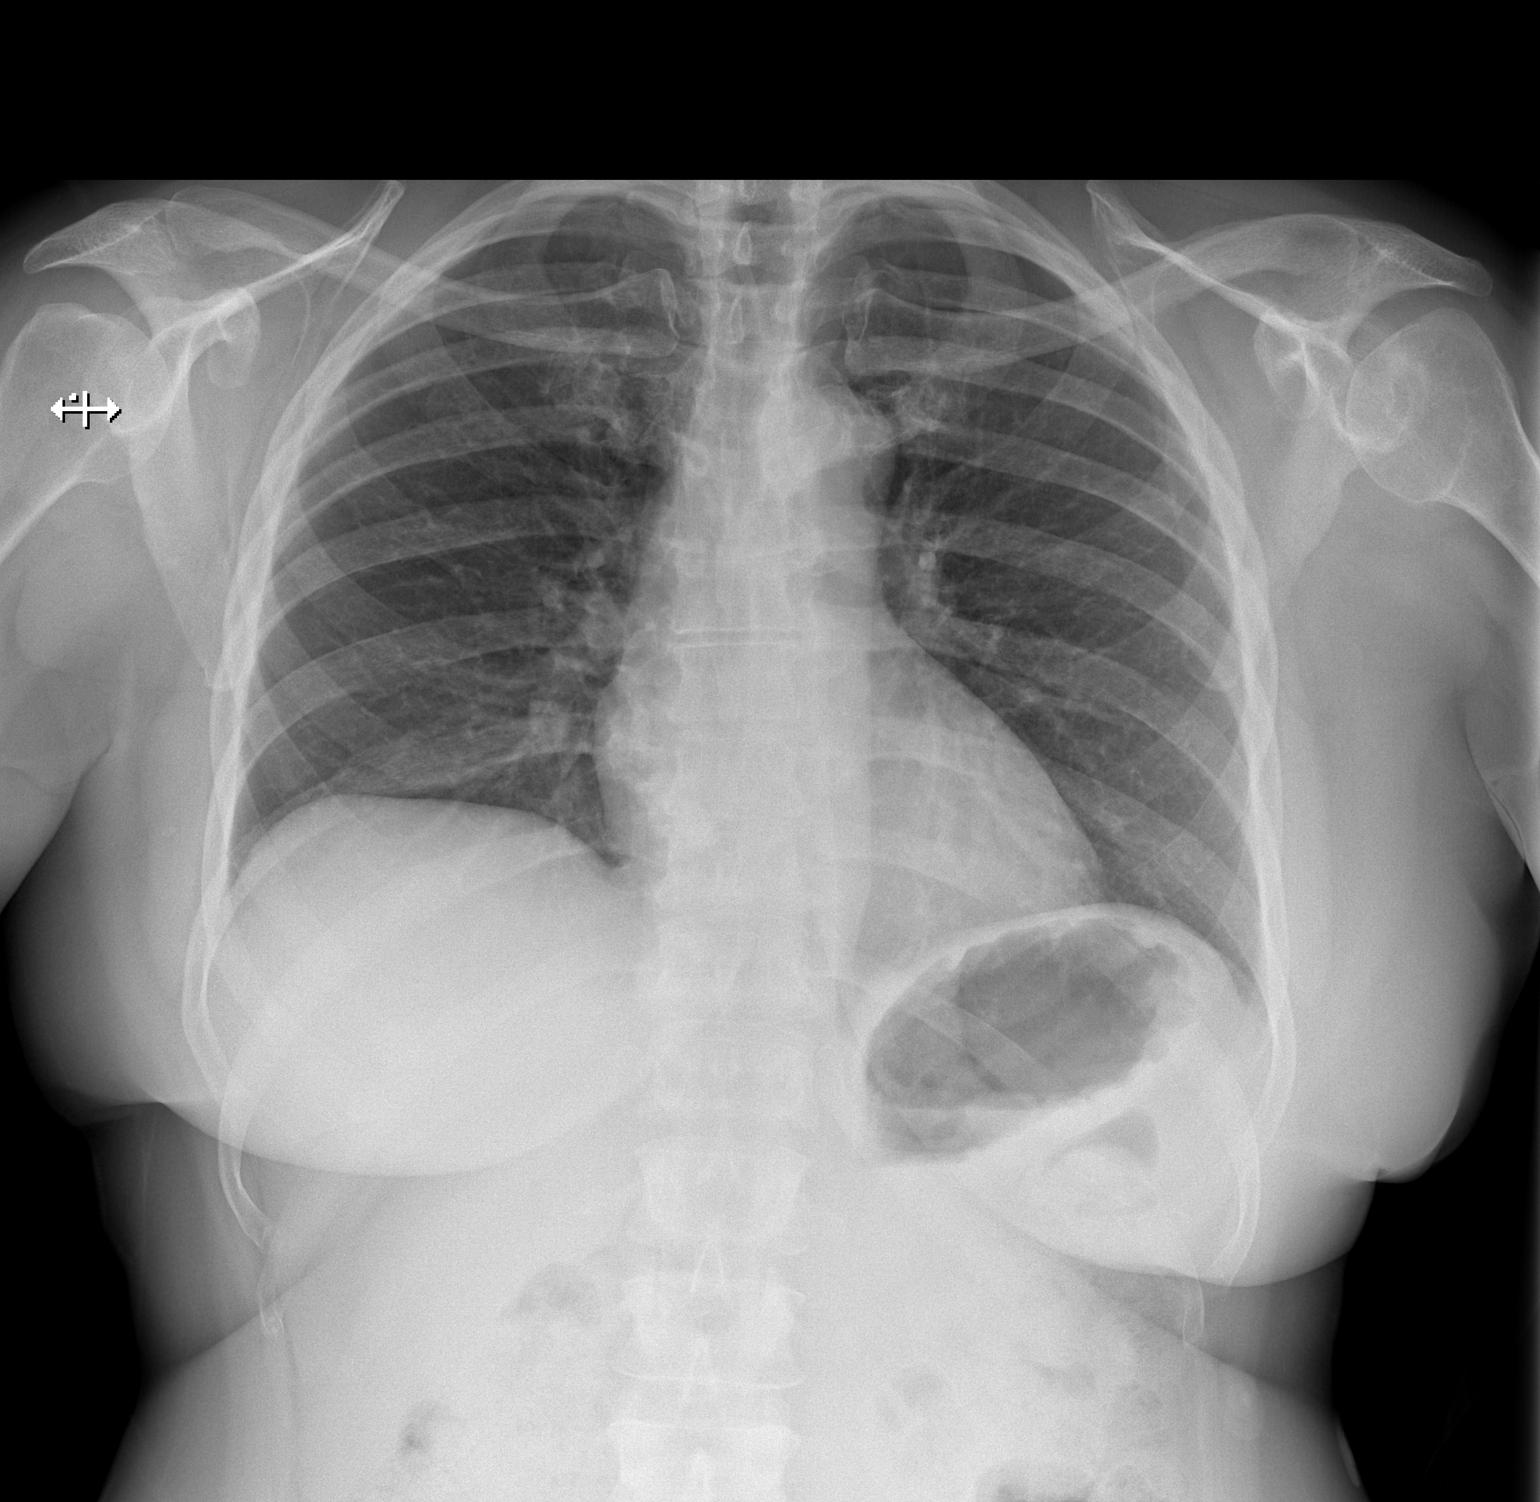

[w chest lat]
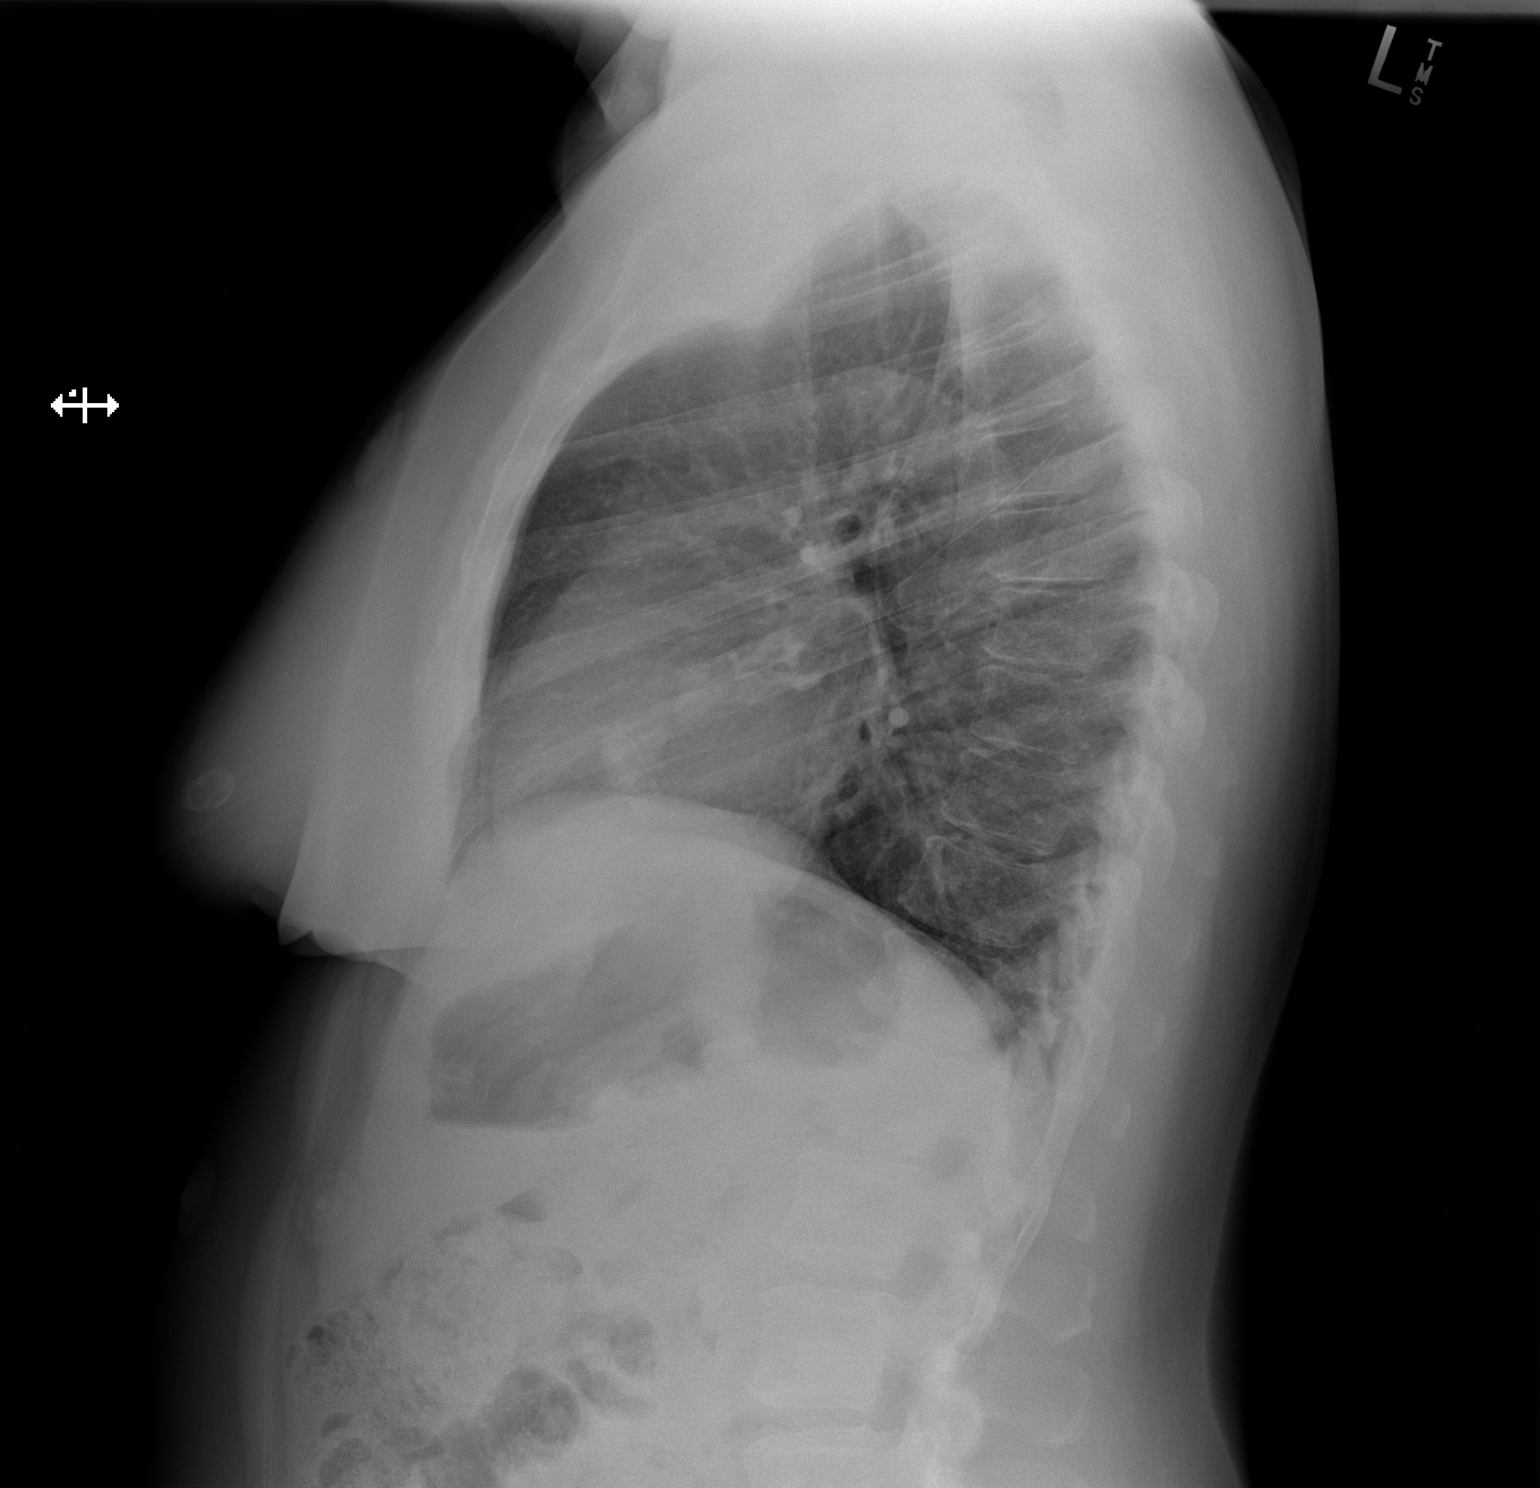

[2 of 2 positions shown; findings below may reference images not displayed]

FINDINGS: Mediastinum and hilar structures normal. Low lung volumes. Lungs are
clear. No pleural effusion or pneumothorax. Tricuspid/right atrial
valve calcification again noted. Heart size normal. No acute bony
abnormality.
IMPRESSION: Low lung volumes. No acute cardiopulmonary disease.

## 2022-02-28 ENCOUNTER — Telehealth: Payer: Self-pay | Admitting: *Deleted

## 2022-02-28 NOTE — Telephone Encounter (Signed)
Patient's handicapped placard is expired. Dr. Brien Few normally fills out for her and she tried to get an appt with him and he retired. She is in the process of finding a new pain management doctor-they told her to ask her PCP. She wants to know if you will fill out for her?

## 2022-02-28 NOTE — Telephone Encounter (Signed)
No.  We did not discuss any limitations at her physical.  I know she has chronic pain, but we didn't discuss in detail--and pt wasn't using any assistive device, not on oxygen or have cardiac dz.   (Chart reviewed--last notes from Dr. Brien Few referred to cervical radiculopathy which had responded to epidural, in 04/2021 had recurrent pain, planned another epidural.  No notes since, and if pain improves with treatment, placard not indicated)

## 2022-03-01 NOTE — Telephone Encounter (Signed)
Patient advised.

## 2022-03-06 ENCOUNTER — Other Ambulatory Visit: Payer: Self-pay | Admitting: Obstetrics and Gynecology

## 2022-03-06 DIAGNOSIS — Z1231 Encounter for screening mammogram for malignant neoplasm of breast: Secondary | ICD-10-CM

## 2022-03-06 DIAGNOSIS — R87810 Cervical high risk human papillomavirus (HPV) DNA test positive: Secondary | ICD-10-CM | POA: Diagnosis not present

## 2022-03-06 DIAGNOSIS — Z01419 Encounter for gynecological examination (general) (routine) without abnormal findings: Secondary | ICD-10-CM | POA: Diagnosis not present

## 2022-03-06 LAB — HM PAP SMEAR: HM Pap smear: NEGATIVE

## 2022-03-06 LAB — RESULTS CONSOLE HPV: CHL HPV: NEGATIVE

## 2022-03-29 ENCOUNTER — Other Ambulatory Visit: Payer: Medicare Other

## 2022-03-31 ENCOUNTER — Other Ambulatory Visit: Payer: Self-pay | Admitting: Family Medicine

## 2022-04-03 ENCOUNTER — Ambulatory Visit
Admission: RE | Admit: 2022-04-03 | Discharge: 2022-04-03 | Disposition: A | Discharge status: Home / Self Care | Payer: Medicare Other | Source: Ambulatory Visit | Attending: Obstetrics and Gynecology | Admitting: Obstetrics and Gynecology

## 2022-04-03 DIAGNOSIS — Z1231 Encounter for screening mammogram for malignant neoplasm of breast: Secondary | ICD-10-CM | POA: Diagnosis not present

## 2022-04-03 NOTE — Telephone Encounter (Signed)
Called and left message for pt to. She no showed her lab visit for recheck last week. Needs to reschedule.

## 2022-04-04 ENCOUNTER — Encounter: Payer: Self-pay | Admitting: Internal Medicine

## 2022-04-05 ENCOUNTER — Other Ambulatory Visit: Payer: Medicare Other

## 2022-04-05 ENCOUNTER — Telehealth: Payer: Self-pay | Admitting: *Deleted

## 2022-04-05 DIAGNOSIS — Z79899 Other long term (current) drug therapy: Secondary | ICD-10-CM | POA: Diagnosis not present

## 2022-04-05 DIAGNOSIS — E785 Hyperlipidemia, unspecified: Secondary | ICD-10-CM | POA: Diagnosis not present

## 2022-04-05 NOTE — Telephone Encounter (Signed)
Patient was here for fasting labs today (she missed and r/s'd). She asked me for a refill on her atorvastatin to be sent to [facility]. She will be out of this Sunday. I let her know it might not be until tomorrw after her labs are back. Sending this to you since results will not be back until tomorrow.   Also she has fasting lab appt set up prior to AWV in Nov-you wanted to wait until these labs were done to decide what, if anything needed. I will forward that message to you as well. Thanks.

## 2022-04-06 LAB — HEPATIC FUNCTION PANEL
ALT: 10 IU/L (ref 0–32)
AST: 17 IU/L (ref 0–40)
Albumin: 4.8 g/dL (ref 3.8–4.9)
Alkaline Phosphatase: 73 IU/L (ref 44–121)
Bilirubin Total: 0.3 mg/dL (ref 0.0–1.2)
Bilirubin, Direct: <0.1 mg/dL (ref 0.00–0.40)
Total Protein: 7.2 g/dL (ref 6.0–8.5)

## 2022-04-06 LAB — LIPID PANEL
Chol/HDL Ratio: 3 ratio (ref 0.0–4.4)
Cholesterol, Total: 167 mg/dL (ref 100–199)
HDL: 55 mg/dL (ref >39)
LDL Chol Calc (NIH): 95 mg/dL (ref 0–99)
Triglycerides: 94 mg/dL (ref 0–149)
VLDL Cholesterol Cal: 17 mg/dL (ref 5–40)

## 2022-04-06 MED ORDER — ATORVASTATIN CALCIUM 20 MG PO TABS: 20.0000 mg | ORAL_TABLET | Freq: Every day | ORAL | 3 refills | Status: DC

## 2022-04-06 NOTE — Telephone Encounter (Signed)
She doesn't need any labs prior to her AWV, they were done before her physical.  I'll let her know this on the lab result through MyChart--you can check to see if she has read this and cancel appt.

## 2022-05-08 ENCOUNTER — Encounter: Payer: Self-pay | Admitting: Internal Medicine

## 2022-05-09 DIAGNOSIS — M542 Cervicalgia: Secondary | ICD-10-CM | POA: Diagnosis not present

## 2022-05-09 DIAGNOSIS — M5412 Radiculopathy, cervical region: Secondary | ICD-10-CM | POA: Diagnosis not present

## 2022-05-09 DIAGNOSIS — M5136 Other intervertebral disc degeneration, lumbar region: Secondary | ICD-10-CM | POA: Diagnosis not present

## 2022-05-22 NOTE — Progress Notes (Unsigned)
No chief complaint on file.    Reported at CPE in May-- Chronic pain--back, all over body and muscle pains (prior to starting any statin). Not much in the way of joint pains, mainly muscular. Treated with gabapentin, robaxin, relafen, lidocaine patch  Hyperlipidemia--she was started on atorvastatin in June.  Repeat labs in September were at goal.  Lab Results  Component Value Date   CHOL 167 04/05/2022   HDL 55 04/05/2022   LDLCALC 95 04/05/2022   TRIG 94 04/05/2022   CHOLHDL 3.0 04/05/2022   Lab Results  Component Value Date   ALT 10 04/05/2022   AST 17 04/05/2022   ALKPHOS 73 04/05/2022   BILITOT 0.3 04/05/2022     ASSESSMENT/PLAN:  Flu shot, COVID booster, decline whatever she refuses. Did she ever get TdaP or shingrix from pharmacy as rec at [facility]?   F/u as scheduled for AWV in 05/2022.

## 2022-05-23 ENCOUNTER — Ambulatory Visit (INDEPENDENT_AMBULATORY_CARE_PROVIDER_SITE_OTHER): Payer: Medicare Other | Admitting: Family Medicine

## 2022-05-23 ENCOUNTER — Encounter: Payer: Self-pay | Admitting: Family Medicine

## 2022-05-23 VITALS — BP 120/78 | HR 72 | Temp 97.7°F | Ht 64.5 in | Wt 178.2 lb

## 2022-05-23 DIAGNOSIS — E785 Hyperlipidemia, unspecified: Secondary | ICD-10-CM

## 2022-05-23 DIAGNOSIS — M545 Low back pain, unspecified: Secondary | ICD-10-CM | POA: Diagnosis not present

## 2022-05-23 DIAGNOSIS — R109 Unspecified abdominal pain: Secondary | ICD-10-CM | POA: Diagnosis not present

## 2022-05-23 LAB — POCT URINALYSIS DIP (PROADVANTAGE DEVICE)
Bilirubin, UA: NEGATIVE
Blood, UA: NEGATIVE
Glucose, UA: NEGATIVE mg/dL
Ketones, POC UA: NEGATIVE mg/dL
Nitrite, UA: NEGATIVE
Protein Ur, POC: NEGATIVE mg/dL
Specific Gravity, Urine: 1.01
Urobilinogen, Ur: 0.2
pH, UA: 6 (ref 5.0–8.0)

## 2022-05-23 NOTE — Patient Instructions (Addendum)
  Continue to use heat, try and do some stretches. Follow up with neurosurgery as planned. You have a very mild bursitis at the hip, which is not related to your current complaints.  Stay well hydrated. If you have recurrent pain, please return for re-evaluation (especially if having any blood in the urine, severe pain, or other urinary symptoms--burning/pain with urination, urgency or frequency). Infection and kidney stone would be possible causes. There is no evidence to suggest infection or stones currently, based on the urine results.  I recommend getting the new shingles vaccine (Shingrix). You may want to check with your insurance to verify what your out of pocket cost may be (usually covered as preventative, but better to verify to avoid any surprises, as this vaccine is expensive), and then schedule a nurse visit at our office when convenient (based on the possible side effects as discussed).   This is a series of 2 injections, spaced 2 months apart.  It doesn't have to be exactly 2 months apart (but can't be sooner), if that isn't feasible for your schedule, but try and get them close to 2 months (and definitely within 6 months of each other, or else the efficacy of the vaccine drops off). This should be separated from other vaccines by at least 2 weeks.  Please bring the record of your last tetanus shot so that we can update your chart. (According to our records, you are due for another one, last tetanus shot being in 11/2011.  I recommend a flu shot.  Please bring your immunization records and your MMR titers to your visit. If it was "non-immune", then an MMR vaccine is indicated. You may want to check with your insurance to see if this would be covered in our office.

## 2022-05-29 DIAGNOSIS — M5416 Radiculopathy, lumbar region: Secondary | ICD-10-CM | POA: Diagnosis not present

## 2022-06-06 NOTE — Progress Notes (Unsigned)
No chief complaint on file.   She was seen 10/25 with 8 days of pain that started in the back, then spread to lower abdomen and R side. At the time of her visit, the abdominal pain had resolved, still had some back and side pain. She goes to [facility] for her back and neck pain.   Did she get injection?? (Sched for neck, was going to change??)   She has some pain that radiates down the RLE.  Numbness and tinging in her feet. No incontinence or bowel changes. Heat and robaxin help some.    PMH, PSH, SH reviewed   ROS: no f/c, URI symptoms, CP. Occasionally gets SOB, but denies now. R sided back pain, radiating down the RLE and to her stomach. Denies dysuria, urgency or frequency.        PHYSICAL EXAM:  There were no vitals taken for this visit.  Wt Readings from Last 3 Encounters:  05/23/22 178 lb 3.2 oz (80.8 kg)  02/08/22 175 lb 9.6 oz (79.7 kg)  12/18/21 176 lb 3.2 oz (79.9 kg)     Well-appearing, pleasant female, in no distress HEENT: conjunctiva and sclera are clear, EOMI. Neck: no lymphadenopathy or mass Heart: regular rate and rhythm Lungs: clear bilaterally Back: no spinal or CVA tenderness. Area of discomfort is R lateral lower lumbar area--nontender to palpation, and no muscular spasm.  SI nontender Abdomen: soft, nontender. No organomegaly or mass Extremities: no edema.  Mildly tender at R trochanteric bursa, nontender over IT band. No pain with hip flexion against resistance. Psych: normal mood, affect, hygiene and grooming Neuro: alert and oriented, cranial nerves intact, normal gait.  ***UPDATE ALL  Urine dip   ASSESSMENT/PLAN:  Urine dip

## 2022-06-07 ENCOUNTER — Ambulatory Visit (INDEPENDENT_AMBULATORY_CARE_PROVIDER_SITE_OTHER): Payer: Medicare Other | Admitting: Family Medicine

## 2022-06-07 ENCOUNTER — Encounter: Payer: Self-pay | Admitting: Family Medicine

## 2022-06-07 ENCOUNTER — Other Ambulatory Visit: Payer: Self-pay | Admitting: Family Medicine

## 2022-06-07 ENCOUNTER — Ambulatory Visit
Admission: RE | Admit: 2022-06-07 | Discharge: 2022-06-07 | Disposition: A | Discharge status: Home / Self Care | Payer: Medicare Other | Source: Ambulatory Visit | Attending: Family Medicine | Admitting: Family Medicine

## 2022-06-07 VITALS — BP 128/78 | HR 72 | Temp 98.1°F | Ht 64.5 in | Wt 174.2 lb

## 2022-06-07 DIAGNOSIS — M25551 Pain in right hip: Secondary | ICD-10-CM

## 2022-06-07 DIAGNOSIS — R109 Unspecified abdominal pain: Secondary | ICD-10-CM

## 2022-06-07 DIAGNOSIS — D259 Leiomyoma of uterus, unspecified: Secondary | ICD-10-CM | POA: Diagnosis not present

## 2022-06-07 LAB — POCT URINALYSIS DIP (PROADVANTAGE DEVICE)
Bilirubin, UA: NEGATIVE
Blood, UA: NEGATIVE
Glucose, UA: NEGATIVE mg/dL
Ketones, POC UA: NEGATIVE mg/dL
Leukocytes, UA: NEGATIVE
Nitrite, UA: NEGATIVE
Protein Ur, POC: NEGATIVE mg/dL
Specific Gravity, Urine: 1.025
Urobilinogen, Ur: 0.2
pH, UA: 6 (ref 5.0–8.0)

## 2022-06-07 NOTE — Patient Instructions (Signed)
Continue to use moist heat (vs ice, whichever feels better) Avoid doing the stretches that trigger the pain. Go to [address] [facility]) for x-rays today. If your x-rays and labs are okay, we will refer you to sports medicine for further evaluation/treatment . I believe that this pain is musculoskeletal (muscle/tendon/bone), not related to your stomach or pelvic organs.

## 2022-06-08 LAB — CBC WITH DIFFERENTIAL/PLATELET
Basophils Absolute: 0 10*3/uL (ref 0.0–0.2)
Basos: 1 %
EOS (ABSOLUTE): 0.1 10*3/uL (ref 0.0–0.4)
Eos: 2 %
Hematocrit: 36.6 % (ref 34.0–46.6)
Hemoglobin: 12.6 g/dL (ref 11.1–15.9)
Immature Grans (Abs): 0 10*3/uL (ref 0.0–0.1)
Immature Granulocytes: 0 %
Lymphocytes Absolute: 2.1 10*3/uL (ref 0.7–3.1)
Lymphs: 38 %
MCH: 31 pg (ref 26.6–33.0)
MCHC: 34.4 g/dL (ref 31.5–35.7)
MCV: 90 fL (ref 79–97)
Monocytes Absolute: 0.4 10*3/uL (ref 0.1–0.9)
Monocytes: 8 %
Neutrophils Absolute: 2.7 10*3/uL (ref 1.4–7.0)
Neutrophils: 51 %
Platelets: 238 10*3/uL (ref 150–450)
RBC: 4.06 x10E6/uL (ref 3.77–5.28)
RDW: 12.4 % (ref 11.7–15.4)
WBC: 5.4 10*3/uL (ref 3.4–10.8)

## 2022-06-08 LAB — COMPREHENSIVE METABOLIC PANEL
ALT: 10 IU/L (ref 0–32)
AST: 12 IU/L (ref 0–40)
Albumin/Globulin Ratio: 1.7 (ref 1.2–2.2)
Albumin: 4.2 g/dL (ref 3.8–4.9)
Alkaline Phosphatase: 68 IU/L (ref 44–121)
BUN/Creatinine Ratio: 17 (ref 9–23)
BUN: 13 mg/dL (ref 6–24)
Bilirubin Total: 0.2 mg/dL (ref 0.0–1.2)
CO2: 24 mmol/L (ref 20–29)
Calcium: 9.4 mg/dL (ref 8.7–10.2)
Chloride: 106 mmol/L (ref 96–106)
Creatinine, Ser: 0.78 mg/dL (ref 0.57–1.00)
Globulin, Total: 2.5 g/dL (ref 1.5–4.5)
Glucose: 96 mg/dL (ref 70–99)
Potassium: 4 mmol/L (ref 3.5–5.2)
Sodium: 141 mmol/L (ref 134–144)
Total Protein: 6.7 g/dL (ref 6.0–8.5)
eGFR: 90 mL/min/{1.73_m2} (ref >59)

## 2022-06-11 ENCOUNTER — Emergency Department (HOSPITAL_BASED_OUTPATIENT_CLINIC_OR_DEPARTMENT_OTHER): Payer: Medicare Other

## 2022-06-11 ENCOUNTER — Other Ambulatory Visit: Payer: Self-pay | Admitting: Family Medicine

## 2022-06-11 ENCOUNTER — Other Ambulatory Visit: Payer: Self-pay

## 2022-06-11 ENCOUNTER — Encounter (HOSPITAL_BASED_OUTPATIENT_CLINIC_OR_DEPARTMENT_OTHER): Payer: Self-pay | Admitting: Emergency Medicine

## 2022-06-11 ENCOUNTER — Emergency Department (HOSPITAL_BASED_OUTPATIENT_CLINIC_OR_DEPARTMENT_OTHER)
Admission: EM | Admit: 2022-06-11 | Discharge: 2022-06-11 | Disposition: A | Discharge status: Home / Self Care | Payer: Medicare Other | Attending: Emergency Medicine | Admitting: Emergency Medicine

## 2022-06-11 DIAGNOSIS — R1031 Right lower quadrant pain: Secondary | ICD-10-CM | POA: Insufficient documentation

## 2022-06-11 DIAGNOSIS — Z853 Personal history of malignant neoplasm of breast: Secondary | ICD-10-CM | POA: Diagnosis not present

## 2022-06-11 DIAGNOSIS — Z79899 Other long term (current) drug therapy: Secondary | ICD-10-CM | POA: Diagnosis not present

## 2022-06-11 DIAGNOSIS — R109 Unspecified abdominal pain: Secondary | ICD-10-CM

## 2022-06-11 DIAGNOSIS — M545 Low back pain, unspecified: Secondary | ICD-10-CM | POA: Insufficient documentation

## 2022-06-11 DIAGNOSIS — I10 Essential (primary) hypertension: Secondary | ICD-10-CM | POA: Diagnosis not present

## 2022-06-11 DIAGNOSIS — N281 Cyst of kidney, acquired: Secondary | ICD-10-CM | POA: Diagnosis not present

## 2022-06-11 LAB — URINALYSIS, ROUTINE W REFLEX MICROSCOPIC
Bilirubin Urine: NEGATIVE
Glucose, UA: NEGATIVE mg/dL
Hgb urine dipstick: NEGATIVE
Ketones, ur: NEGATIVE mg/dL
Leukocytes,Ua: NEGATIVE
Nitrite: NEGATIVE
Protein, ur: NEGATIVE mg/dL
Specific Gravity, Urine: 1.008 (ref 1.005–1.030)
pH: 5 (ref 5.0–8.0)

## 2022-06-11 LAB — CBC
HCT: 40.3 % (ref 36.0–46.0)
Hemoglobin: 13.3 g/dL (ref 12.0–15.0)
MCH: 30.4 pg (ref 26.0–34.0)
MCHC: 33 g/dL (ref 30.0–36.0)
MCV: 92.2 fL (ref 80.0–100.0)
Platelets: 261 10*3/uL (ref 150–400)
RBC: 4.37 MIL/uL (ref 3.87–5.11)
RDW: 12.7 % (ref 11.5–15.5)
WBC: 8 10*3/uL (ref 4.0–10.5)
nRBC: 0 % (ref 0.0–0.2)

## 2022-06-11 LAB — COMPREHENSIVE METABOLIC PANEL
ALT: 12 U/L (ref 0–44)
AST: 15 U/L (ref 15–41)
Albumin: 4.6 g/dL (ref 3.5–5.0)
Alkaline Phosphatase: 56 U/L (ref 38–126)
Anion gap: 8 (ref 5–15)
BUN: 18 mg/dL (ref 6–20)
CO2: 26 mmol/L (ref 22–32)
Calcium: 9.7 mg/dL (ref 8.9–10.3)
Chloride: 104 mmol/L (ref 98–111)
Creatinine, Ser: 0.76 mg/dL (ref 0.44–1.00)
GFR, Estimated: >60 mL/min (ref >60)
Glucose, Bld: 101 mg/dL — ABNORMAL HIGH (ref 70–99)
Potassium: 3.8 mmol/L (ref 3.5–5.1)
Sodium: 138 mmol/L (ref 135–145)
Total Bilirubin: 0.4 mg/dL (ref 0.3–1.2)
Total Protein: 7.8 g/dL (ref 6.5–8.1)

## 2022-06-11 LAB — PREGNANCY, URINE: Preg Test, Ur: NEGATIVE

## 2022-06-11 LAB — LIPASE, BLOOD: Lipase: 38 U/L (ref 11–51)

## 2022-06-11 MED ORDER — KETOROLAC TROMETHAMINE 15 MG/ML IJ SOLN
15.0000 mg | Freq: Once | INTRAMUSCULAR | Status: AC
  Administered 2022-06-11: 15 mg via INTRAVENOUS
  Filled 2022-06-11: qty 1

## 2022-06-11 MED ORDER — HYDROCODONE-ACETAMINOPHEN 5-325 MG PO TABS: 1.0000 | ORAL_TABLET | Freq: Four times a day (QID) | ORAL | 0 refills | Status: DC | PRN

## 2022-06-11 MED ORDER — KETOROLAC TROMETHAMINE 10 MG PO TABS: 10.0000 mg | ORAL_TABLET | Freq: Four times a day (QID) | ORAL | 0 refills | Status: DC | PRN

## 2022-06-11 NOTE — Addendum Note (Signed)
Addended by: Rita Ohara on: 06/11/2022 08:14 AM   Modules accepted: Orders

## 2022-06-11 NOTE — Progress Notes (Unsigned)
    Olivia [name] D.[place] Phone: [phone]   Assessment and Plan:     There are no diagnoses linked to this encounter.  ***   Pertinent previous records reviewed include ***   Follow Up: ***     Subjective:   I, Olivia [name], am serving as a Education administrator for Doctor Glennon Mac  Chief Complaint: right hip pain   HPI:   06/12/2022 Patient is a 56 year old female complaining of right hip pain. Patient states  Relevant Historical Information: ***  Additional pertinent review of systems negative.   Current Outpatient Medications:    ascorbic acid (VITAMIN C) 250 MG tablet, Take 500 mg by mouth daily., Disp: , Rfl:    atorvastatin (LIPITOR) 20 MG tablet, Take 1 tablet (20 mg total) by mouth daily., Disp: 90 tablet, Rfl: 3   Calcium Carbonate-Vitamin D 600-200 MG-UNIT TABS, Take 2 tablets by mouth daily., Disp: , Rfl:    diltiazem (CARDIZEM CD) 180 MG 24 hr capsule, Take 1 capsule (180 mg total) by mouth daily., Disp: 90 capsule, Rfl: 3   esomeprazole (NEXIUM) 20 MG capsule, Take 20 mg by mouth daily at 12 noon., Disp: , Rfl:    furosemide (LASIX) 40 MG tablet, TAKE 1 TABLET(40 MG) BY MOUTH DAILY, Disp: 30 tablet, Rfl: 9   gabapentin (NEURONTIN) 600 MG tablet, Take 600 mg by mouth at bedtime., Disp: , Rfl:    ibuprofen (ADVIL) 200 MG tablet, Take 600 mg by mouth every 6 (six) hours as needed., Disp: , Rfl:    lidocaine (LIDODERM) 5 %, Place 1 patch onto the skin as needed. (Patient not taking: Reported on 12/18/2021), Disp: , Rfl: 5   lisinopril (ZESTRIL) 5 MG tablet, TAKE 1 TABLET(5 MG) BY MOUTH DAILY, Disp: 90 tablet, Rfl: 3   methocarbamol (ROBAXIN) 500 MG tablet, Take 500 mg by mouth as needed for muscle spasms., Disp: , Rfl:    Multiple Vitamins-Minerals (MULTIVITAMIN WITH MINERALS) tablet, Take 1 tablet by mouth daily., Disp: , Rfl:    nabumetone (RELAFEN) 500 MG tablet, Take 500 mg by mouth 2  (two) times daily., Disp: , Rfl:    potassium chloride SA (KLOR-CON M) 20 MEQ tablet, TAKE 1 TABLET BY MOUTH EVERY OTHER DAY, Disp: 90 tablet, Rfl: 3   Turmeric (QC TUMERIC COMPLEX PO), Take by mouth daily., Disp: , Rfl:    vitamin E 400 UNIT capsule, Take 400 Units by mouth daily., Disp: , Rfl:    Objective:     There were no vitals filed for this visit.    There is no height or weight on file to calculate BMI.    Physical Exam:    ***   Electronically signed by:  Olivia [name] D.[name] 4:17 PM 06/11/22

## 2022-06-11 NOTE — Discharge Instructions (Signed)
Return to ED with any new or worsening signs or symptoms Follow-up with sports medicine as previously scheduled Please only take pain medication for breakthrough pain at night.  Please be aware that this will sedate you.  Please take Toradol for pain during the day.

## 2022-06-11 NOTE — ED Notes (Signed)
Discharge paperwork given and verbally understood. 

## 2022-06-11 NOTE — ED Triage Notes (Signed)
Pt arrives to ED with c/o RLQ abdominal pain that started x1 week ago.

## 2022-06-11 NOTE — ED Provider Notes (Signed)
[facility] EMERGENCY DEPT Provider Note   CSN: [id number] Arrival date & time: 06/11/22  1608     History  Chief Complaint  Patient presents with   Abdominal Pain    Olivia [name] is a 56 y.o. female with history of uterine fibroids, lumbar DDD, hypertension, GERD, history of breast cancer, chronic back pain.  Patient presents to ED for evaluation of right flank pain.  Patient reports that beginning on 10/17 she developed right-sided low back/flank pain.  The patient reports that this pain is waxed and waned over the last 3 weeks.  Patient reportedly goes to [place] [facility] for back and neck pain and had an injection in her back on 10/31 which did improve the pain however the abdominal pain recurred.  The patient reports that the pain will get acutely worse out of nowhere without reason and then resolved.  Patient reports that she has been taking ibuprofen at home with slight relief of symptoms however symptoms have not been fully treated.  Patient reports history of kidney stones however states that this does not feel similar to those instances.  Patient denies any fevers, nausea, vomiting, diarrhea, dysuria, vaginal discharge, chest pain or shortness of breath.   Abdominal Pain Associated symptoms: no chest pain, no diarrhea, no dysuria, no fever, no nausea, no shortness of breath, no vaginal discharge and no vomiting        Home Medications Prior to Admission medications   Medication Sig Start Date End Date Taking? Authorizing Provider  HYDROcodone-acetaminophen (NORCO/VICODIN) 5-325 MG tablet Take 1 tablet by mouth every 6 (six) hours as needed for severe pain. 06/11/22  Yes Al Decant, PA-C  ketorolac (TORADOL) 10 MG tablet Take 1 tablet (10 mg total) by mouth every 6 (six) hours as needed. 06/11/22  Yes Al Decant, PA-C  ascorbic acid (VITAMIN C) 250 MG tablet Take 500 mg by mouth daily.    [provider]  atorvastatin (LIPITOR)  20 MG tablet Take 1 tablet (20 mg total) by mouth daily. 04/06/22   Joselyn Arrow, MD  Calcium Carbonate-Vitamin D 600-200 MG-UNIT TABS Take 2 tablets by mouth daily.    [provider]  diltiazem (CARDIZEM CD) 180 MG 24 hr capsule Take 1 capsule (180 mg total) by mouth daily. 06/19/21   Ronney Asters, NP  esomeprazole (NEXIUM) 20 MG capsule Take 20 mg by mouth daily at 12 noon.    [provider]  furosemide (LASIX) 40 MG tablet TAKE 1 TABLET(40 MG) BY MOUTH DAILY 12/29/21   Marykay Lex, MD  gabapentin (NEURONTIN) 600 MG tablet Take 600 mg by mouth at bedtime.    [provider]  ibuprofen (ADVIL) 200 MG tablet Take 600 mg by mouth every 6 (six) hours as needed.    [provider]  lidocaine (LIDODERM) 5 % Place 1 patch onto the skin as needed. Patient not taking: Reported on 12/18/2021 04/30/14   [provider]  lisinopril (ZESTRIL) 5 MG tablet TAKE 1 TABLET(5 MG) BY MOUTH DAILY 08/08/21   Marykay Lex, MD  methocarbamol (ROBAXIN) 500 MG tablet Take 500 mg by mouth as needed for muscle spasms.    [provider]  Multiple Vitamins-Minerals (MULTIVITAMIN WITH MINERALS) tablet Take 1 tablet by mouth daily.    [provider]  nabumetone (RELAFEN) 500 MG tablet Take 500 mg by mouth 2 (two) times daily.    [provider]  potassium chloride SA (KLOR-CON M) 20 MEQ tablet TAKE 1  TABLET BY MOUTH EVERY OTHER DAY 01/29/22   Marykay Lex, MD  Turmeric (QC TUMERIC COMPLEX PO) Take by mouth daily.    [provider]  vitamin E 400 UNIT capsule Take 400 Units by mouth daily.    [provider]      Allergies    Adhesive [tape] and Morphine    Review of Systems   Review of Systems  Constitutional:  Negative for fever.  Respiratory:  Negative for shortness of breath.   Cardiovascular:  Negative for chest pain.  Gastrointestinal:  Positive for abdominal pain. Negative for diarrhea, nausea and vomiting.   Genitourinary:  Positive for flank pain. Negative for dysuria and vaginal discharge.    Physical Exam Updated Vital Signs BP (!) 148/97 (BP Location: Left Arm)   Pulse 76   Temp 98 F (36.7 C) (Oral)   Resp 18   SpO2 100%  Physical Exam Vitals and nursing note reviewed.  Constitutional:      General: She is not in acute distress.    Appearance: She is well-developed.  HENT:     Head: Normocephalic and atraumatic.     Nose: Nose normal.     Mouth/Throat:     Mouth: Mucous membranes are moist.     Pharynx: Oropharynx is clear.  Eyes:     Conjunctiva/sclera: Conjunctivae normal.  Cardiovascular:     Rate and Rhythm: Normal rate and regular rhythm.     Heart sounds: No murmur heard. Pulmonary:     Effort: Pulmonary effort is normal. No respiratory distress.     Breath sounds: Normal breath sounds.  Abdominal:     Palpations: Abdomen is soft.     Tenderness: There is no abdominal tenderness. There is right CVA tenderness.  Musculoskeletal:        General: No swelling.     Cervical back: Neck supple.  Skin:    General: Skin is warm and dry.     Capillary Refill: Capillary refill takes less than 2 seconds.  Neurological:     Mental Status: She is alert and oriented to person, place, and time.  Psychiatric:        Mood and Affect: Mood normal.     ED Results / Procedures / Treatments   Labs (all labs ordered are listed, but only abnormal results are displayed) Labs Reviewed  COMPREHENSIVE METABOLIC PANEL - Abnormal; Notable for the following components:      Result Value   Glucose, Bld 101 (*)    All other components within normal limits  URINALYSIS, ROUTINE W REFLEX MICROSCOPIC - Abnormal; Notable for the following components:   Color, Urine COLORLESS (*)    All other components within normal limits  LIPASE, BLOOD  CBC  PREGNANCY, URINE    EKG None  Radiology CT Renal Stone Study  Result Date: 06/11/2022 CLINICAL DATA:  Right lower quadrant abdominal  pain x1 week. Remote history of right breast cancer. EXAM: CT ABDOMEN AND PELVIS WITHOUT CONTRAST TECHNIQUE: Multidetector CT imaging of the abdomen and pelvis was performed following the standard protocol without IV contrast. RADIATION DOSE REDUCTION: This exam was performed according to the departmental dose-optimization program which includes automated exposure control, adjustment of the mA and/or kV according to patient size and/or use of iterative reconstruction technique. COMPARISON:  05/06/2015 FINDINGS: Lower chest: Lung bases are clear. Hepatobiliary: 11 mm cyst in the anterior right hepatic dome (series 2/image 7). Gallbladder is unremarkable. No intrahepatic or extrahepatic duct dilatation. Pancreas: Within normal limits.  Spleen: Within normal limits. Adrenals/Urinary Tract: Adrenal glands are within normal limits. 8 mm left upper pole renal cyst (series 2/image 19). Right kidney is within normal limits. No hydronephrosis. Bladder is within normal limits. Stomach/Bowel: Stomach is within normal limits. No evidence of bowel obstruction. Normal appendix (series 2/image 62). No colonic wall thickening or inflammatory changes. Vascular/Lymphatic: No evidence of abdominal aortic aneurysm. No suspicious abdominopelvic lymphadenopathy. Reproductive: Calcified uterine fibroids. Bilateral ovaries are within normal limits. Other: No abdominopelvic ascites. Musculoskeletal: Mild degenerative changes at L5-S1. IMPRESSION: Normal appendix. No CT findings to account for the patient's right lower quadrant abdominal pain. Electronically Signed   By: Charline Bills M.D.   On: 06/11/2022 19:32    Procedures Procedures   Medications Ordered in ED Medications  ketorolac (TORADOL) 15 MG/ML injection 15 mg (15 mg Intravenous Given 06/11/22 1953)    ED Course/ Medical Decision Making/ A&P                           Medical Decision Making Amount and/or Complexity of Data Reviewed Labs: ordered. Radiology:  ordered.  Risk Prescription drug management.   56 year old female presents ED for evaluation of right-sided flank pain.  Please see HPI for further details.  On my examination patient is afebrile and nontachycardic.  The patient lung sounds are clear bilaterally, she is not hypoxic.  The patient abdomen is soft and compressible throughout, the patient does have his CVA tenderness to the right.  The patient is nontoxic in appearance.  Due to patient complaint we will proceed with following imaging and lab studies to include CMP, CBC, lipase, urinalysis, CT renal stone study.  Patient treated with 15 mg of Toradol for pain.  Patient reports that her pain has alleviated.  Patient lab work unremarkable.  CBC shows no leukocytosis.  Lipase unremarkable, not elevated.  CMP unremarkable, creatinine not elevated.  The patient urinalysis is unremarkable.  Patient CT renal stone study is unremarkable, no appendicitis, no nephrolithiasis.  At this time, patient stable for discharge.  The patient requested pain medication for breakthrough pain at night.  I advised the patient that I would write her a very short prescription of 5 pills for pain that she should only take for breakthrough pain.  Advised the patient that I will also write her prescription for Toradol which I prefer her to take for pain prior to resorting to hydrocodone.  Patient was advised that pain medication would sedate her and that she can only use it at night for breakthrough pain.  Patient voiced understanding of my instructions.  Patient advised to follow-up with her PCP Dr. Lynelle Doctor for further management.  Patient all of her questions answered to her satisfaction.  The patient is stable for discharge.  Final Clinical Impression(s) / ED Diagnoses Final diagnoses:  Right flank pain    Rx / DC Orders ED Discharge Orders          Ordered    HYDROcodone-acetaminophen (NORCO/VICODIN) 5-325 MG tablet  Every 6 hours PRN        06/11/22  2035    ketorolac (TORADOL) 10 MG tablet  Every 6 hours PRN        06/11/22 2035              Al Decant, PA-C 06/11/22 2036    Pricilla Loveless, MD 06/12/22 1700

## 2022-06-12 ENCOUNTER — Ambulatory Visit: Payer: Self-pay

## 2022-06-12 ENCOUNTER — Telehealth: Payer: Self-pay | Admitting: Family Medicine

## 2022-06-12 ENCOUNTER — Other Ambulatory Visit: Payer: Medicare Other

## 2022-06-12 ENCOUNTER — Ambulatory Visit: Payer: Medicare Other | Admitting: Sports Medicine

## 2022-06-12 VITALS — BP 132/80 | HR 87 | Ht 64.0 in | Wt 174.0 lb

## 2022-06-12 DIAGNOSIS — M25551 Pain in right hip: Secondary | ICD-10-CM | POA: Diagnosis not present

## 2022-06-12 DIAGNOSIS — S39011A Strain of muscle, fascia and tendon of abdomen, initial encounter: Secondary | ICD-10-CM

## 2022-06-12 MED ORDER — MELOXICAM 15 MG PO TABS: 15.0000 mg | ORAL_TABLET | Freq: Every day | ORAL | 0 refills | Status: DC

## 2022-06-12 NOTE — Patient Instructions (Addendum)
Good to see you - Start meloxicam 15 mg daily x2 weeks.  If still having pain after 2 weeks, complete 3rd-week of meloxicam. May use remaining meloxicam as needed once daily for pain control.  Do not to use additional NSAIDs while taking meloxicam.  May use Tylenol [phone] mg 2 to 3 times a day for breakthrough pain. Core HEP  3 week follow up

## 2022-06-12 NOTE — Telephone Encounter (Signed)
Transition Care Management Unsuccessful Follow-up Telephone Call  Date of discharge and from where:  06/11/2022 [facility]  Attempts:  1st Attempt  Reason for unsuccessful TCM follow-up call:  Left voice message

## 2022-06-12 NOTE — Telephone Encounter (Signed)
I referred her to Sports Med for this same problem when I saw her in the office. She has appt with them today.  And she has Medicare wellness visit with me next week.

## 2022-06-12 NOTE — Telephone Encounter (Signed)
Olivia [name] called back and wanted to let you know she is doing ok after her ED visit. She had an appointment today with her sports medicine doctor and she is doing fine.

## 2022-06-13 ENCOUNTER — Telehealth: Payer: Self-pay | Admitting: *Deleted

## 2022-06-13 ENCOUNTER — Other Ambulatory Visit: Payer: Self-pay | Admitting: Sports Medicine

## 2022-06-13 DIAGNOSIS — S39011A Strain of muscle, fascia and tendon of abdomen, initial encounter: Secondary | ICD-10-CM

## 2022-06-13 DIAGNOSIS — M25551 Pain in right hip: Secondary | ICD-10-CM

## 2022-06-13 MED ORDER — LIDOCAINE 5 % EX PTCH: 1.0000 | MEDICATED_PATCH | Freq: Every day | CUTANEOUS | 0 refills | Status: AC | PRN

## 2022-06-13 NOTE — Telephone Encounter (Signed)
Transition Care Management Follow-up Telephone Call Date of discharge and from where: 06/11/2022 [facility] How have you been since you were released from the hospital? Doing ok Any questions or concerns? No  Items Reviewed: Did the pt receive and understand the discharge instructions provided? Yes  Medications obtained and verified? Yes  Other? No  Any new allergies since your discharge? No  Dietary orders reviewed? No Do you have support at home? Yes   Home Care and Equipment/Supplies: Were home health services ordered? no I  [facility] app[facility] confirmed? Yes  Scheduled to see sports medicine on 06/13/2022 . Are transportation arrangements needed? No  If their condition worsens, is the pt aware to call PCP or go to the Emergency Dept.? No Was the patient provided with contact information for the PCP's office or ED? Yes Was to pt encouraged to call back with questions or concerns? Yes

## 2022-06-13 NOTE — Telephone Encounter (Signed)
Pt called requesting a lidocaine patch rx be sent into her pharmacy.

## 2022-06-13 NOTE — Telephone Encounter (Signed)
Called pt left VM notifying that we sent patches into pharmacy

## 2022-06-14 NOTE — Telephone Encounter (Signed)
[employer] called to notify that the rx for lidocaine patches have been denied due to not being prescribed by medicare guidelines.

## 2022-06-16 NOTE — Progress Notes (Deleted)
No chief complaint on file.  Olivia [name] is a 56 y.o. female who presents for Medicare Annual Wellness visit. She had her annual physical exam in May.  She is under the care of Dr. Garwin Brothers for her GYN exams.  She has been seen recently by me, and in the ER, for R hip/abdominal pain.  I felt it was musculoskeletal, referred to sports medicine, who agreed. Felt she had strain of R oblique. She was prescribed meloxicam '15mg'$  daily x 2 weeks, with potential for 3rd week, if needed. He encouraged HEP for core. No evidence of kidney stones or appendicitis on CT abdomen pelvis, no evidence of UTI/ pyelonephritis on lab work from [place].   Patient is treated by neurosurgery for neck and back pain and had recent lumbar epidural with improvement of back pain.   She has hypertension, hypertensive heart disease with diastolic dysfunction noted on echo, palpitations, and hyperlipidemia. She is under the care of Dr. Ellyn Hack; last saw cardiology for visit in 11/2021.  She remains on diltazem and lisinopril. She takes furosemide and potassium every other day.  She gets some swelling at the end of the day, relieved by elevating her legs.  She denies chest pain, palpitations, DOE. Atorvastatin was started in 12/2021, and she is tolerating this without side effects.  Lipids were improved on recheck. Lab Results  Component Value Date   CHOL 167 04/05/2022   HDL 55 04/05/2022   LDLCALC 95 04/05/2022   TRIG 94 04/05/2022   CHOLHDL 3.0 04/05/2022     Immunization History  Administered Date(s) Administered   Influenza-Unspecified 06/07/2005   Moderna Sars-Covid-2 Vaccination 11/14/2019, 12/12/2019   Td 03/29/1993, 04/29/2003   Tdap 12/23/2011    Last Pap smear: 07/2020 normal no high risk HPV Last mammogram: 03/2022 Last colonoscopy:  09/2018 Dr. Collene Mares, normal. 10 yr f/u Last DEXA: 12/2013, normal Dentist: sees dentist, periodontist and orthodontist Ophtho: a few years, plans to schedule Exercise:   Walks on the  treadmill usually 3x/week. No weight-bearing exercise.  Hep C screen done 11/2008 Vitamin D-OH normal at 46.3 in 09/2017  Patient Care Team: Rita Ohara, MD as PCP - General (Family Medicine) Leonie Man, MD as PCP - Cardiology (Cardiology) Sable Feil, MD as Consulting Physician (Gastroenterology) Marcy Panning, MD as Consulting Physician (Oncology) Marlaine Hind, MD as Consulting Physician (Physical Medicine and Rehabilitation) GYN--Dr. Garwin Brothers Sports Med--Dr. Glennon Mac  Depression Screening: [place] Office Visit from 12/18/2021 in [place]  PHQ-2 Total Score 0         Falls screen:     12/18/2021   10:36 AM 06/10/2014    2:22 PM  [place] in the past year? 0 No  Number falls in past yr: 0   Injury with Fall? 0   Risk for fall due to : No Fall Risks   Follow up Falls evaluation completed      Functional Status Survey:           PMH, PSH, SH and FH were reviewed and updated     ROS: The patient denies anorexia, fever, weight changes, headaches, vision changes, decreased hearing, ear pain, sore throat, breast concerns, chest pain, palpitations, dizziness, syncope, dyspnea on exertion, cough, swelling (just mild at end of day, per HPI), nausea, vomiting, diarrhea, constipation, abdominal pain, melena, hematochezia, hematuria, incontinence, dysuria, vaginal bleeding, discharge, odor or itch, genital lesions, joint pains, numbness, tingling, weakness, tremor, suspicious skin lesions, depression, anxiety, abnormal bleeding/bruising, or enlarged lymph  nodes.  No cycles since 2010. Denies hot flashes Chronic pain--neck, back (back pain improved after recent injection). No other joint pains (most other pain is muscular) R hip/abdominal pain per HPI She takes OTC nexium--taking since reflux flared with chemo.  Tried weaning off, gets some recurrent symptoms without it.  UPDATE Occ mild allergy symptoms. See HPI.     PHYSICAL  EXAM:  There were no vitals taken for this visit.  Wt Readings from Last 3 Encounters:  06/12/22 174 lb (78.9 kg)  06/07/22 174 lb 3.2 oz (79 kg)  05/23/22 178 lb 3.2 oz (80.8 kg)    Well-appearing, pleasant female in no distress HEENT: conjunctiva and sclera are clear, EOMI. Neck: no lymphadenopathy or mass Heart: regular rate and rhythm Lungs: clear bilaterally Back: no spinal or CVA tenderness. Abdomen: soft, no organomegaly. Very slight diffuse tenderness at R lower abdomen and at ASIS (anteromedial aspect) Extremities: no edema.   Psych: normal mood, affect, hygiene and grooming Neuro: alert and oriented, cranial nerves intact, normal gait.    ASSESSMENT/PLAN:   Does she have Medicare part D? If so, to get TdaP and Shingrix from pharmacy. Offer/decline COVID booster   Discussed monthly self breast exams and yearly mammograms; at least 30 minutes of aerobic activity at least 5 days/week, weight-bearing exercise at least 2x/week; proper sunscreen use reviewed; healthy diet, including goals of calcium and vitamin D intake and alcohol recommendations (less than or equal to 1 drink/day) reviewed; regular seatbelt use; changing batteries in smoke detectors.  Immunization recommendations discussed--TdaP due, has Medicare, needs to get from pharmacy. Strongly encouraged yearly flu shots in the Fall, as well as the updated bivalent COVID booster.   Shingrix discussed (risks, side effects), encouraged her to get this from the pharmacy.   Colonoscopy recommendations reviewed, UTD, due again 09/2028

## 2022-06-16 NOTE — Patient Instructions (Incomplete)
  Ms. Dermody , Thank you for taking time to come for your Medicare Wellness Visit. I appreciate your ongoing commitment to your health goals. Please review the following plan we discussed and let me know if I can assist you in the future.   This is a list of the screening recommended for you and due dates:  Health Maintenance  Topic Date Due   Medicare Annual Wellness Visit  Never done   Zoster (Shingles) Vaccine (1 of 2) Never done   COVID-19 Vaccine (3 - Moderna risk series) 01/09/2020   Flu Shot  10/28/2022*   Mammogram  04/04/2023   Pap Smear  08/10/2025   Colon Cancer Screening  10/09/2028   Hepatitis C Screening: USPSTF Recommendation to screen - Ages 18-79 yo.  Completed   HIV Screening  Completed   HPV Vaccine  Aged Out  *Topic was postponed. The date shown is not the original due date.   Yearly flu shots are recommended. COVID booster is recommended.  You are due for a tetanus booster. You will need to get this from the pharmacy.  I recommend getting the new shingles vaccine (Shingrix). Since you have Medicare, you will need to get this from the pharmacy, as it is covered by Part D. This is a series of 2 injections, spaced 2 months apart.   This should be separated from other vaccines by at least 2 weeks.

## 2022-06-18 ENCOUNTER — Encounter: Payer: Medicare Other | Admitting: Family Medicine

## 2022-06-18 ENCOUNTER — Other Ambulatory Visit: Payer: Self-pay | Admitting: Sports Medicine

## 2022-06-18 DIAGNOSIS — Z0000 Encounter for general adult medical examination without abnormal findings: Secondary | ICD-10-CM

## 2022-06-24 ENCOUNTER — Other Ambulatory Visit: Payer: Self-pay | Admitting: General Practice

## 2022-06-24 DIAGNOSIS — E785 Hyperlipidemia, unspecified: Secondary | ICD-10-CM

## 2022-06-24 DIAGNOSIS — R0602 Shortness of breath: Secondary | ICD-10-CM

## 2022-06-24 DIAGNOSIS — I110 Hypertensive heart disease with heart failure: Secondary | ICD-10-CM

## 2022-06-24 DIAGNOSIS — I5030 Unspecified diastolic (congestive) heart failure: Secondary | ICD-10-CM

## 2022-06-24 DIAGNOSIS — R002 Palpitations: Secondary | ICD-10-CM

## 2022-07-04 NOTE — Progress Notes (Unsigned)
No chief complaint on file.   Olivia [name] is a 56 y.o. female who presents for Medicare Annual Wellness visit. She had her annual physical exam in May.  She is under the care of Dr. Garwin Brothers for her GYN exams.   She was seenin November by me, and in the ER, for R hip/abdominal pain.  I felt it was musculoskeletal, referred to sports medicine, who agreed. Felt she had strain of R oblique. She was prescribed meloxicam '15mg'$  daily x 2 weeks, with potential for 3rd week, if needed. He encouraged HEP for core. No evidence of kidney stones or appendicitis on CT abdomen pelvis, no evidence of UTI/ pyelonephritis on lab work from [place].    Patient is treated by neurosurgery for neck and back pain and had recent lumbar epidural with improvement of back pain.    She has hypertension, hypertensive heart disease with diastolic dysfunction noted on echo, palpitations, and hyperlipidemia. She is under the care of Dr. Ellyn Hack; last saw cardiology for visit in 11/2021.  She remains on diltazem and lisinopril. She takes furosemide and potassium every other day.  She gets some swelling at the end of the day, relieved by elevating her legs.  She denies chest pain, palpitations, DOE. Atorvastatin was started in 12/2021, and she is tolerating this without side effects.  Lipids were improved on recheck. Lab Results  Component Value Date   CHOL 167 04/05/2022   HDL 55 04/05/2022   LDLCALC 95 04/05/2022   TRIG 94 04/05/2022   CHOLHDL 3.0 04/05/2022     Immunization History  Administered Date(s) Administered   Influenza-Unspecified 06/07/2005   Moderna Sars-Covid-2 Vaccination 11/14/2019, 12/12/2019   Td 03/29/1993, 04/29/2003   Tdap 12/23/2011   Last Pap smear: 07/2020 normal no high risk HPV Last mammogram: 03/2022 Last colonoscopy:  09/2018 Dr. Collene Mares, normal. 10 yr f/u Last DEXA: 12/2013, normal Dentist: sees dentist, periodontist and orthodontist Ophtho: a few years, plans to schedule Exercise:   Walks on  the treadmill usually 3x/week. No weight-bearing exercise.   Hep C screen done 11/2008 Vitamin D-OH normal at 46.3 in 09/2017   Patient Care Team: Rita Ohara, MD as PCP - General (Family Medicine) Leonie Man, MD as PCP - Cardiology (Cardiology) Sable Feil, MD as Consulting Physician (Gastroenterology) Marcy Panning, MD as Consulting Physician (Oncology) Marlaine Hind, MD as Consulting Physician (Physical Medicine and Rehabilitation) GYN--Dr. Garwin Brothers Sports Med--Dr. Glennon Mac  Depression Screening: [place] Office Visit from 12/18/2021 in [place]  PHQ-2 Total Score 0         Falls screen:     12/18/2021   10:36 AM 06/10/2014    2:22 PM  Branch in the past year? 0 No  Number falls in past yr: 0   Injury with Fall? 0   Risk for fall due to : No Fall Risks   Follow up Falls evaluation completed      Functional Status Survey:        PMH, PSH, SH and FH were reviewed and updated       ROS: The patient denies anorexia, fever, weight changes, headaches, vision changes, decreased hearing, ear pain, sore throat, breast concerns, chest pain, palpitations, dizziness, syncope, dyspnea on exertion, cough, swelling (just mild at end of day, per HPI), nausea, vomiting, diarrhea, constipation, abdominal pain, melena, hematochezia, hematuria, incontinence, dysuria, vaginal bleeding, discharge, odor or itch, genital lesions, joint pains, numbness, tingling, weakness, tremor, suspicious skin lesions, depression, anxiety, abnormal bleeding/bruising,  or enlarged lymph nodes.  No cycles since 2010. Denies hot flashes Chronic pain--neck, back (back pain improved after recent injection). No other joint pains (most other pain is muscular) R hip/abdominal pain per HPI She takes OTC nexium--taking since reflux flared with chemo.  Tried weaning off, gets some recurrent symptoms without it.  UPDATE Occ mild allergy symptoms. See HPI.     PHYSICAL  EXAM:   There were no vitals taken for this visit.  Wt Readings from Last 3 Encounters:  06/12/22 174 lb (78.9 kg)  06/07/22 174 lb 3.2 oz (79 kg)  05/23/22 178 lb 3.2 oz (80.8 kg)     Well-appearing, pleasant female in no distress HEENT: conjunctiva and sclera are clear, EOMI. Neck: no lymphadenopathy or mass Heart: regular rate and rhythm Lungs: clear bilaterally Back: no spinal or CVA tenderness. Abdomen: soft, no organomegaly. Very slight diffuse tenderness at R lower abdomen and at ASIS (anteromedial aspect) Extremities: no edema.   Psych: normal mood, affect, hygiene and grooming Neuro: alert and oriented, cranial nerves intact, normal gait.     ASSESSMENT/PLAN:   Does she have Medicare part D? If so, to get TdaP and Shingrix from pharmacy. Offer/decline COVID booster     Discussed monthly self breast exams and yearly mammograms; at least 30 minutes of aerobic activity at least 5 days/week, weight-bearing exercise at least 2x/week; proper sunscreen use reviewed; healthy diet, including goals of calcium and vitamin D intake and alcohol recommendations (less than or equal to 1 drink/day) reviewed; regular seatbelt use; changing batteries in smoke detectors.  Immunization recommendations discussed--TdaP due, has Medicare, needs to get from pharmacy. Strongly encouraged yearly flu shots in the Fall, as well as the updated bivalent COVID booster.   Shingrix discussed (risks, side effects), encouraged her to get this from the pharmacy.   Colonoscopy recommendations reviewed, UTD, due again 09/2028

## 2022-07-04 NOTE — Patient Instructions (Incomplete)
  Olivia [name] , Thank you for taking time to come for your Medicare Wellness Visit. I appreciate your ongoing commitment to your health goals. Please review the following plan we discussed and let me know if I can assist you in the future.   This is a list of the screening recommended for you and due dates:  Health Maintenance  Topic Date Due   Medicare Annual Wellness Visit  Never done   Zoster (Shingles) Vaccine (1 of 2) Never done   COVID-19 Vaccine (3 - Moderna risk series) 01/09/2020   DTaP/Tdap/Td vaccine (4 - Td or Tdap) 12/22/2021   Flu Shot  10/28/2022*   Mammogram  04/04/2023   Pap Smear  08/10/2025   Colon Cancer Screening  10/09/2028   Hepatitis C Screening: USPSTF Recommendation to screen - Ages 18-79 yo.  Completed   HIV Screening  Completed   HPV Vaccine  Aged Out  *Topic was postponed. The date shown is not the original due date.   Yearly flu shots are recommended. COVID booster is recommended.   You are due for a tetanus booster. You will need to get this from the pharmacy.   I recommend getting the new shingles vaccine (Shingrix). Since you have Medicare, you will need to get this from the pharmacy, as it is covered by Part D. This is a series of 2 injections, spaced 2 months apart.   This should be separated from other vaccines by at least 2 weeks.  Stop using advil entirely while taking the meloxicam.  I haven't seen your B12 levels/labs, so I can't say for sure, but you likely do not need weekly B12 shots longterm.  You can either cut back on the frequency (once a month), or even consider taking a daliy B12 1000 mcg ('1mg'$ ) every day, and then following it up with recheck of labs in 3 months to see if levels are still okay.  Please bring [place] copies of your Living Will and [place] once completed and notarized so that it can be scanned into your medical chart.

## 2022-07-05 ENCOUNTER — Ambulatory Visit (INDEPENDENT_AMBULATORY_CARE_PROVIDER_SITE_OTHER): Payer: Medicare Other | Admitting: Family Medicine

## 2022-07-05 ENCOUNTER — Encounter: Payer: Self-pay | Admitting: Family Medicine

## 2022-07-05 VITALS — BP 118/64 | HR 68 | Ht 64.0 in | Wt 178.4 lb

## 2022-07-05 DIAGNOSIS — Z0000 Encounter for general adult medical examination without abnormal findings: Secondary | ICD-10-CM

## 2022-07-05 DIAGNOSIS — I10 Essential (primary) hypertension: Secondary | ICD-10-CM

## 2022-07-05 DIAGNOSIS — Z7185 Encounter for immunization safety counseling: Secondary | ICD-10-CM | POA: Diagnosis not present

## 2022-07-05 DIAGNOSIS — R109 Unspecified abdominal pain: Secondary | ICD-10-CM

## 2022-07-12 ENCOUNTER — Encounter: Payer: Self-pay | Admitting: *Deleted

## 2022-08-02 ENCOUNTER — Other Ambulatory Visit: Payer: Self-pay | Admitting: Cardiology

## 2022-08-24 DIAGNOSIS — M5412 Radiculopathy, cervical region: Secondary | ICD-10-CM | POA: Diagnosis not present

## 2022-09-07 DIAGNOSIS — M5136 Other intervertebral disc degeneration, lumbar region: Secondary | ICD-10-CM | POA: Diagnosis not present

## 2022-09-07 DIAGNOSIS — M5416 Radiculopathy, lumbar region: Secondary | ICD-10-CM | POA: Diagnosis not present

## 2022-09-07 DIAGNOSIS — M542 Cervicalgia: Secondary | ICD-10-CM | POA: Diagnosis not present

## 2022-09-07 DIAGNOSIS — M5412 Radiculopathy, cervical region: Secondary | ICD-10-CM | POA: Diagnosis not present

## 2022-09-18 DIAGNOSIS — M4802 Spinal stenosis, cervical region: Secondary | ICD-10-CM | POA: Diagnosis not present

## 2022-09-18 DIAGNOSIS — R202 Paresthesia of skin: Secondary | ICD-10-CM | POA: Diagnosis not present

## 2022-09-18 DIAGNOSIS — M5412 Radiculopathy, cervical region: Secondary | ICD-10-CM | POA: Diagnosis not present

## 2022-09-18 DIAGNOSIS — R200 Anesthesia of skin: Secondary | ICD-10-CM | POA: Diagnosis not present

## 2022-09-18 DIAGNOSIS — M47812 Spondylosis without myelopathy or radiculopathy, cervical region: Secondary | ICD-10-CM | POA: Diagnosis not present

## 2022-09-21 DIAGNOSIS — M5416 Radiculopathy, lumbar region: Secondary | ICD-10-CM | POA: Diagnosis not present

## 2022-10-25 DIAGNOSIS — M5136 Other intervertebral disc degeneration, lumbar region: Secondary | ICD-10-CM | POA: Diagnosis not present

## 2022-10-25 DIAGNOSIS — M5416 Radiculopathy, lumbar region: Secondary | ICD-10-CM | POA: Diagnosis not present

## 2022-10-25 DIAGNOSIS — M5412 Radiculopathy, cervical region: Secondary | ICD-10-CM | POA: Diagnosis not present

## 2022-10-25 DIAGNOSIS — M542 Cervicalgia: Secondary | ICD-10-CM | POA: Diagnosis not present

## 2022-11-20 DIAGNOSIS — M5412 Radiculopathy, cervical region: Secondary | ICD-10-CM | POA: Diagnosis not present

## 2022-12-19 ENCOUNTER — Encounter (HOSPITAL_BASED_OUTPATIENT_CLINIC_OR_DEPARTMENT_OTHER): Payer: Self-pay | Admitting: Emergency Medicine

## 2022-12-19 ENCOUNTER — Emergency Department (HOSPITAL_BASED_OUTPATIENT_CLINIC_OR_DEPARTMENT_OTHER)
Admission: EM | Admit: 2022-12-19 | Discharge: 2022-12-19 | Disposition: A | Discharge status: Home / Self Care | Payer: Medicare Other | Attending: Emergency Medicine | Admitting: Emergency Medicine

## 2022-12-19 ENCOUNTER — Emergency Department (HOSPITAL_BASED_OUTPATIENT_CLINIC_OR_DEPARTMENT_OTHER): Payer: Medicare Other

## 2022-12-19 ENCOUNTER — Other Ambulatory Visit: Payer: Self-pay

## 2022-12-19 DIAGNOSIS — R42 Dizziness and giddiness: Secondary | ICD-10-CM | POA: Diagnosis not present

## 2022-12-19 DIAGNOSIS — H8120 Vestibular neuronitis, unspecified ear: Secondary | ICD-10-CM | POA: Diagnosis not present

## 2022-12-19 DIAGNOSIS — R55 Syncope and collapse: Secondary | ICD-10-CM | POA: Diagnosis not present

## 2022-12-19 DIAGNOSIS — Z853 Personal history of malignant neoplasm of breast: Secondary | ICD-10-CM | POA: Diagnosis not present

## 2022-12-19 DIAGNOSIS — I10 Essential (primary) hypertension: Secondary | ICD-10-CM | POA: Diagnosis not present

## 2022-12-19 DIAGNOSIS — Z79899 Other long term (current) drug therapy: Secondary | ICD-10-CM | POA: Diagnosis not present

## 2022-12-19 LAB — BASIC METABOLIC PANEL
Anion gap: 9 (ref 5–15)
BUN: 15 mg/dL (ref 6–20)
CO2: 25 mmol/L (ref 22–32)
Calcium: 9.6 mg/dL (ref 8.9–10.3)
Chloride: 105 mmol/L (ref 98–111)
Creatinine, Ser: 0.77 mg/dL (ref 0.44–1.00)
GFR, Estimated: >60 mL/min (ref >60)
Glucose, Bld: 102 mg/dL — ABNORMAL HIGH (ref 70–99)
Potassium: 3.5 mmol/L (ref 3.5–5.1)
Sodium: 139 mmol/L (ref 135–145)

## 2022-12-19 LAB — CBC
HCT: 39.7 % (ref 36.0–46.0)
Hemoglobin: 13.7 g/dL (ref 12.0–15.0)
MCH: 31.1 pg (ref 26.0–34.0)
MCHC: 34.5 g/dL (ref 30.0–36.0)
MCV: 90.2 fL (ref 80.0–100.0)
Platelets: 309 10*3/uL (ref 150–400)
RBC: 4.4 MIL/uL (ref 3.87–5.11)
RDW: 12.1 % (ref 11.5–15.5)
WBC: 6.9 10*3/uL (ref 4.0–10.5)
nRBC: 0 % (ref 0.0–0.2)

## 2022-12-19 LAB — URINALYSIS, ROUTINE W REFLEX MICROSCOPIC
Bilirubin Urine: NEGATIVE
Glucose, UA: NEGATIVE mg/dL
Hgb urine dipstick: NEGATIVE
Ketones, ur: NEGATIVE mg/dL
Leukocytes,Ua: NEGATIVE
Nitrite: NEGATIVE
Protein, ur: NEGATIVE mg/dL
Specific Gravity, Urine: 1.008 (ref 1.005–1.030)
pH: 6 (ref 5.0–8.0)

## 2022-12-19 LAB — CBG MONITORING, ED: Glucose-Capillary: 104 mg/dL — ABNORMAL HIGH (ref 70–99)

## 2022-12-19 MED ORDER — PREDNISONE 20 MG PO TABS: 40.0000 mg | ORAL_TABLET | Freq: Every day | ORAL | 0 refills | Status: DC

## 2022-12-19 MED ORDER — MECLIZINE HCL 25 MG PO TABS: 25.0000 mg | ORAL_TABLET | Freq: Three times a day (TID) | ORAL | 0 refills | Status: DC | PRN

## 2022-12-19 MED ORDER — MECLIZINE HCL 25 MG PO TABS
25.0000 mg | ORAL_TABLET | Freq: Once | ORAL | Status: AC
  Administered 2022-12-19: 25 mg via ORAL
  Filled 2022-12-19: qty 1

## 2022-12-19 NOTE — ED Notes (Signed)
Pt ambulated successfully in hallway with slow steady gait without assistance. Reported slight dizziness, however it has improved significantly since arrival

## 2022-12-19 NOTE — ED Triage Notes (Signed)
Pt via pov from home with lightheadedness since 1345 hours today. Pt reports that she had not eaten much before this started, but that she ate a meal and it continued. She states that she felt woozy. This feeling continues. Pt states she feels "wobbly" and thinks she might fall if she stands up. She also reports that her ears feel clogged.

## 2022-12-19 NOTE — ED Provider Notes (Signed)
Olivia [name]     History  Chief Complaint  Patient presents with   Dizziness    Olivia [name] is a 57 y.o. female.  Patient is a 57 year old female with a history of hypertension, diastolic dysfunction with an EF of 50% with a prior right atrial thrombus in 2016 which is now resolved, prior history of breast cancer status post chemo who is presenting today due to sudden onset of dizziness that started when she was in the car today.  She was driving to the dentist when she reported feeling sudden lightheadedness like she was off balance.  She reports it resolved she went to the dentist and after having a cleaning of her teeth she was at a store when she had another episode which was much worse.  She felt like both of her ears were clogged and then hypersensitive at the same time with some blurry vision and felt like she was going numb all over.  She was having difficult time walking and it seemed that all the symptoms were worsened if she did move her head or trying to stand and walk.  She had a little bit of nausea but denies any vomiting.  No unilateral symptoms in her arm or leg or difficulty speaking.  No recent medication changes or illnesses except for sinus issues 2 weeks ago which she felt had completely resolved.  She denies history of similar symptoms.  She does not have headache or neck pain.  No trauma.  She reports the symptoms are still there a little bit but have almost completely resolved  The history is provided by the patient.  Dizziness      Home Medications Prior to Admission medications   Medication Sig Start Date End Date Taking? Authorizing Provider  meclizine (ANTIVERT) 25 MG tablet Take 1 tablet (25 mg total) by mouth 3 (three) times daily as needed for dizziness. 12/19/22  Yes Mcihael Hinderman, Alphonzo Lemmings, MD  predniSONE (DELTASONE) 20 MG tablet Take 2 tablets (40  mg total) by mouth daily. 12/19/22  Yes Gwyneth Sprout, MD  ascorbic acid (VITAMIN C) 250 MG tablet Take 500 mg by mouth daily.    [provider]  atorvastatin (LIPITOR) 20 MG tablet Take 1 tablet (20 mg total) by mouth daily. 04/06/22   Joselyn Arrow, MD  Calcium Carbonate-Vitamin D (CALCIUM PLUS VITAMIN D PO) Take 2 capsules by mouth daily.    [provider]  diltiazem (CARDIZEM CD) 180 MG 24 hr capsule TAKE 1 CAPSULE(180 MG) BY MOUTH DAILY 06/26/22   Marykay Lex, MD  esomeprazole (NEXIUM) 20 MG capsule Take 20 mg by mouth daily at 12 noon.    [provider]  furosemide (LASIX) 40 MG tablet TAKE 1 TABLET(40 MG) BY MOUTH DAILY 12/29/21   Marykay Lex, MD  gabapentin (NEURONTIN) 600 MG tablet Take 600 mg by mouth at bedtime.    [provider]  HYDROcodone-acetaminophen (NORCO/VICODIN) 5-325 MG tablet Take 1 tablet by mouth every 6 (six) hours as needed for severe pain. Patient not taking: Reported on 07/05/2022 06/11/22   Al Decant, PA-C  ibuprofen (ADVIL) 200 MG tablet Take 600 mg by mouth every 6 (six) hours as needed.    [provider]  lidocaine (LIDODERM) 5 % Place 1 patch onto the skin daily as needed. 06/13/22   Richardean Sale, DO  lisinopril (ZESTRIL) 5 MG tablet TAKE  1 TABLET(5 MG) BY MOUTH DAILY 08/02/22   Marykay Lex, MD  meloxicam (MOBIC) 15 MG tablet Take 1 tablet (15 mg total) by mouth daily. 06/12/22   Richardean Sale, DO  methocarbamol (ROBAXIN) 500 MG tablet Take 500 mg by mouth as needed for muscle spasms.    [provider]  Multiple Vitamins-Minerals (MULTIVITAMIN WITH MINERALS) tablet Take 1 tablet by mouth daily.    [provider]  nabumetone (RELAFEN) 500 MG tablet Take 500 mg by mouth 2 (two) times daily. Patient not taking: Reported on 07/05/2022    [provider]  potassium chloride SA (KLOR-CON M) 20 MEQ tablet TAKE 1 TABLET BY MOUTH EVERY OTHER DAY 01/29/22   Marykay Lex, MD  Turmeric (QC TUMERIC COMPLEX PO) Take by mouth daily.    [provider]  vitamin E 400 UNIT capsule Take 400 Units by mouth daily.    [provider]      Allergies    Adhesive [tape] and Morphine    Review of Systems   Review of Systems  Neurological:  Positive for dizziness.    Physical Exam Updated Vital Signs BP 128/80   Pulse 72   Temp 98.6 F (37 C) (Oral)   Resp 15   Ht 5\' 5"  (1.651 m)   Wt 74.8 kg   SpO2 98%   BMI 27.46 kg/m  Physical Exam Vitals and nursing note reviewed.  Constitutional:      General: She is not in acute distress.    Appearance: She is well-developed.  HENT:     Head: Normocephalic and atraumatic.     Right Ear: Tympanic membrane normal.     Left Ear: Tympanic membrane normal.     Nose: Nose normal.     Mouth/Throat:     Mouth: Mucous membranes are moist.  Eyes:     General: No visual field deficit.    Extraocular Movements: Extraocular movements intact.     Pupils: Pupils are equal, round, and reactive to light.  Cardiovascular:     Rate and Rhythm: Normal rate and regular rhythm.     Heart sounds: Normal heart sounds. No murmur heard.    No friction rub.  Pulmonary:     Effort: Pulmonary effort is normal.     Breath sounds: Normal breath sounds. No wheezing or rales.  Abdominal:     General: Bowel sounds are normal. There is no distension.     Palpations: Abdomen is soft.     Tenderness: There is no abdominal tenderness. There is no guarding or rebound.  Musculoskeletal:        General: No tenderness. Normal range of motion.     Cervical back: Normal range of motion and neck supple. No tenderness.     Comments: No edema  Skin:    General: Skin is warm and dry.     Findings: No rash.  Neurological:     Mental Status: She is alert and oriented to person, place, and time. Mental status is at baseline.     Cranial Nerves: No cranial nerve deficit, dysarthria or facial asymmetry.     Sensory: Sensation is  intact. No sensory deficit.     Motor: No weakness or pronator drift.     Coordination: Coordination is intact. Finger-Nose-Finger Test and Heel to [place] Test normal.     Gait: Gait is intact.  Psychiatric:        Behavior: Behavior normal.     ED Results /  Procedures / Treatments   Labs (all labs ordered are listed, but only abnormal results are displayed) Labs Reviewed  BASIC METABOLIC PANEL - Abnormal; Notable for the following components:      Result Value   Glucose, Bld 102 (*)    All other components within normal limits  URINALYSIS, ROUTINE W REFLEX MICROSCOPIC - Abnormal; Notable for the following components:   Color, Urine COLORLESS (*)    All other components within normal limits  CBG MONITORING, ED - Abnormal; Notable for the following components:   Glucose-Capillary 104 (*)    All other components within normal limits  CBC  CBG MONITORING, ED    EKG EKG Interpretation  Date/Time:  Wednesday Dec 19 2022 16:48:15 EDT Ventricular Rate:  75 PR Interval:  180 QRS Duration: 84 QT Interval:  388 QTC Calculation: 433 R Axis:   24 Text Interpretation: Normal sinus rhythm Normal ECG When compared with ECG of 15-Sep-2020 12:23, Nonspecific T wave abnormality no longer evident in Lateral leads No significant change since last tracing Confirmed by Gwyneth Sprout ([id number]) on 12/19/2022 5:22:50 PM  Radiology CT HEAD WO CONTRAST  Result Date: 12/19/2022 CLINICAL DATA:  Syncope/presyncope, cerebrovascular cause suspected EXAM: CT HEAD WITHOUT CONTRAST TECHNIQUE: Contiguous axial images were obtained from the base of the skull through the vertex without intravenous contrast. RADIATION DOSE REDUCTION: This exam was performed according to the departmental dose-optimization program which includes automated exposure control, adjustment of the mA and/or kV according to patient size and/or use of iterative reconstruction technique. COMPARISON:  CT Head 11/04/08 FINDINGS: Brain: No  evidence of acute infarction, hemorrhage, hydrocephalus, extra-axial collection or mass lesion/mass effect. Mineralization of the basal ganglia bilaterally Vascular: No hyperdense vessel or unexpected calcification. Skull: Normal. Negative for fracture or focal lesion. Sinuses/Orbits: No middle ear or mastoid effusion. Polypoid mucosal thickening in the right maxillary sinus. Orbits are unremarkable Other: None. IMPRESSION: No acute intracranial abnormality. Electronically Signed   By: Lorenza Cambridge M.D.   On: 12/19/2022 18:02    Procedures Procedures    Medications Ordered in ED Medications  meclizine (ANTIVERT) tablet 25 mg (25 mg Oral Given 12/19/22 1804)    ED Course/ Medical Decision Making/ A&P                             Medical Decision Making Amount and/or Complexity of Data Reviewed Labs: ordered. Decision-making details documented in ED Course. Radiology: ordered and independent interpretation performed. Decision-making details documented in ED Course. ECG/medicine tests: ordered and independent interpretation performed. Decision-making details documented in ED Course.   Pt with multiple medical problems and comorbidities and presenting today with a complaint that caries a high risk for morbidity and mortality.  Pt with sx most consistent with peripheral vertigo.  No systemic or infectious sx.  Normal neuro exam without weakness, ataxia or cerebellar findings on exam.  Normal vision.  Sx are reproducible with movement with attempting to walk.  No hx of Stroke but pt does have some risk factors.  Mild HTN here. Will treat for peripheral vertigo and re-eval.  I independently interpreted patient's labs and EKG.  EKG within normal limits, urine, CBC and BMP within normal limits.  I have independently visualized and interpreted pt's images today.  Head CT is negative for acute abnormalities.  On reevaluation patient reports that she feels better still has some minimal dizziness.  8:00  PM Patient was able to ambulate without any ataxia.  She  is still complaining of some ear pain and concern for may be of vestibular neuritis.  No vesicles seen on the ear at this time.  At this time we will treat with meclizine and have patient follow-up with ENT if symptoms or not improving in her PCP.  At this time low suspicion for stroke.         Final Clinical Impression(s) / ED Diagnoses Final diagnoses:  Vertigo  Vestibular neuronitis, unspecified laterality    Rx / DC Orders ED Discharge Orders          Ordered    predniSONE (DELTASONE) 20 MG tablet  Daily        12/19/22 2000    meclizine (ANTIVERT) 25 MG tablet  3 times daily PRN        12/19/22 Alden Hipp, MD 12/19/22 2000

## 2022-12-19 NOTE — ED Notes (Signed)
RN reviewed discharge instructions with pt. Pt verbalized understanding and had no further questions. VSS upon discharge.  

## 2022-12-19 NOTE — Progress Notes (Unsigned)
No chief complaint on file.   Olivia [name] is a 57 y.o. female who presents for annual physical exam.  She is under the care of Dr. Cherly Hensen for her GYN exams.  She went to the ER yesterday for evaluation of vertigo.  She was treated with prednisone and antivert.  Head CT: IMPRESSION: No acute intracranial abnormality.  Labs: Lab Results  Component Value Date   WBC 6.9 12/19/2022   HGB 13.7 12/19/2022   HCT 39.7 12/19/2022   MCV 90.2 12/19/2022   PLT 309 12/19/2022   Normal b-met (nonfasting glu 102)   Chemistry      Component Value Date/Time   NA 139 12/19/2022 1651   NA 141 06/07/2022 1050   NA 142 06/12/2016 1450   K 3.5 12/19/2022 1651   K 4.3 06/12/2016 1450   CL 105 12/19/2022 1651   CL 105 12/03/2012 1437   CO2 25 12/19/2022 1651   CO2 27 06/12/2016 1450   BUN 15 12/19/2022 1651   BUN 13 06/07/2022 1050   BUN 9.7 06/12/2016 1450   CREATININE 0.77 12/19/2022 1651   CREATININE 0.9 06/12/2016 1450      Component Value Date/Time   CALCIUM 9.6 12/19/2022 1651   CALCIUM 9.4 06/12/2016 1450   ALKPHOS 56 06/11/2022 1634   ALKPHOS 83 06/12/2016 1450   AST 15 06/11/2022 1634   AST 17 06/12/2016 1450   ALT 12 06/11/2022 1634   ALT 15 06/12/2016 1450   BILITOT 0.4 06/11/2022 1634   BILITOT 0.2 06/07/2022 1050   BILITOT 0.33 06/12/2016 1450     Urine: normal  Patient is treated by neurosurgery for neck and back pain. Pain R thigh?   She has hypertension, hypertensive heart disease with diastolic dysfunction noted on echo, palpitations, and hyperlipidemia. She is under the care of Dr. Herbie Baltimore; last saw cardiology for visit in 11/2021.  She remains on diltazem and lisinopril. She takes furosemide and potassium every other day. She gets some swelling at the end of the day, relieved by elevating her legs.  She denies chest pain, palpitations, DOE. Atorvastatin was started in 12/2021, and she is tolerating this without side effects.  Lipids were improved on  recheck. Lab Results  Component Value Date   CHOL 167 04/05/2022   HDL 55 04/05/2022   LDLCALC 95 04/05/2022   TRIG 94 04/05/2022   CHOLHDL 3.0 04/05/2022   She goes to [facility] now only for B12 injections, weekly.    Immunization History  Administered Date(s) Administered   Influenza-Unspecified 06/07/2005   Moderna Sars-Covid-2 Vaccination 11/14/2019, 12/12/2019   Td 03/29/1993, 04/29/2003   Tdap 12/23/2011   She declines flu shots and any additional COVID boosters Last Pap smear: 07/2020 normal no high risk HPV. Sees Dr. Cherly Hensen regularly. Last mammogram: 03/2022 Last colonoscopy:  09/2018 Dr. Loreta Ave, normal. 10 yr f/u Last DEXA: 12/2013, normal Dentist: sees dentist, periodontist and orthodontist Ophtho: a few years ago Exercise:   Exercising depends on her pain level.  Not much lately. Walks on the treadmill usually 3x/week. No weight-bearing exercise.   Hep C screen done 11/2008 Vitamin D-OH normal at 46.3 in 09/2017   Patient Care Team: Joselyn Arrow, MD as PCP - General (Family Medicine) Marykay Lex, MD as PCP - Cardiology (Cardiology) Mardella Layman, MD as Consulting Physician (Gastroenterology) Drue Second, MD as Consulting Physician (Oncology) Callie Fielding, MD as Consulting Physician (Physical Medicine and Rehabilitation) GYN--Dr. Cherly Hensen Sports Med--Dr. Jean Rosenthal Periodontist: Dr. Tobie Lords Oncologist: Dr. Mosetta Putt Pain  management--Dr. Lorrine Kin (no longer sees Dr. Murray Hodgkins) Derm: Dr. Darreld Mclean Ophtho: [place]   PMH, PSH, [facility] and FH were reviewed and updated         ROS: The patient denies anorexia, fever, weight changes, headaches, vision changes, decreased hearing, ear pain, sore throat, breast concerns, chest pain, palpitations, dizziness, syncope, dyspnea on exertion, cough, swelling (just mild at end of day, per HPI), nausea, vomiting, diarrhea, constipation, abdominal pain, melena, hematochezia, hematuria, incontinence, dysuria, vaginal bleeding,  discharge, odor or itch, genital lesions, joint pains, numbness, tingling, weakness, tremor, suspicious skin lesions, depression, anxiety, abnormal bleeding/bruising, or enlarged lymph nodes.  No cycles since 2010. Denies hot flashes Chronic pain--neck, back, R hip/lower abdomen, and now also into the R thigh No other joint pains (most other pain is muscular) She takes OTC nexium--tried weaning off, gets some recurrent symptoms without it.        PHYSICAL EXAM:   There were no vitals taken for this visit.  Wt Readings from Last 3 Encounters:  12/19/22 165 lb (74.8 kg)  07/05/22 178 lb 6.4 oz (80.9 kg)  06/12/22 174 lb (78.9 kg)   General Appearance:    Alert, cooperative, no distress, appears stated age. Not changed into gown.  Head:    Normocephalic, without obvious abnormality, atraumatic  Eyes:    PERRL, conjunctiva/corneas clear, EOM's intact, fundi    benign  Ears:    Normal TM's and external ear canals  Nose:   Normal, no drainage  Throat:   Normal mucosa, no erythema or lesions.  Braces  Neck:   Supple, no lymphadenopathy;  thyroid:  no enlargement/ tenderness/nodules; no carotid bruit or JVD  Back:    Spine nontender, no curvature, ROM normal, no CVA  tenderness  Lungs:     Clear to auscultation bilaterally without wheezes, rales or  ronchi; respirations unlabored  Chest Wall:    No tenderness or deformity   Heart:    Regular rate and rhythm, S1 and S2 normal, no murmur, rub or gallop  Breast Exam:    Deferred to GYN  Abdomen:     Soft, non-tender, nondistended, normoactive bowel sounds, no masses, no hepatosplenomegaly  Genitalia:    Deferred to GYN       Extremities:   No clubbing, cyanosis or edema (just trace on left)  Pulses:   2+ and symmetric all extremities  Skin:   Skin color, texture, turgor normal, no rashes or lesions, but skin exam very limited (not undressed)  Lymph nodes:   No abnormal cervical or supraclavicular nodes  Neurologic:   Normal strength,  sensation and gait; reflexes 2+ and symmetric throughout               Psych:   Normal mood, affect, hygiene and grooming.    UPDATE-- *** braces?   ASSESSMENT/PLAN:   CPE only (doesn't need fall/depression, done in December) Went to ER yesterday for vertigo  Did she ever get Tdap from pharmacy?? Shingrix? COVID booster? (Decline)  I d/c'd orders for cbc, urine (DOES NOT NEED)--had in ER yesterday. B-met normal yesterday, can order glu and LFT's  Screen for thyroid--last was 5 years ago.  ***SYMPTOMS/DX (Medicare won't cover for CPE code) Lab Results  Component Value Date   TSH 1.380 10/14/2017     Discussed monthly self breast exams and yearly mammograms; at least 30 minutes of aerobic activity at least 5 days/week, weight-bearing exercise at least 2x/week; proper sunscreen use reviewed; healthy diet, including goals of calcium  and vitamin D intake and alcohol recommendations (less than or equal to 1 drink/day) reviewed; regular seatbelt use; changing batteries in smoke detectors.  Immunization recommendations discussed--TdaP due, has Medicare, needs to get from pharmacy. Shingrix discussed (risks, side effects), encouraged her to get this from the pharmacy.  COVID booster was recommended, but declined. Yearly flu shots ae recommended in the fall (pt refuses). Colonoscopy recommendations reviewed, UTD, due again 09/2028   She will f/u in 6 mos for AWV (after 12/7), CPE 1 year.

## 2022-12-19 NOTE — Discharge Instructions (Signed)
You can use the meclizine as needed for dizzy spells but also we did a short course of steroids to see if that will help the fluid feeling in your ear.  You will have good times and then you may have times of dizziness that come and go over the next week.  If your symptoms persist you need to follow-up with your regular doctor or ENT.  If it anytime you have loss of your vision, inability to move 1 side of your body, difficulty with speech you should return to the emergency room immediately.

## 2022-12-20 ENCOUNTER — Ambulatory Visit (INDEPENDENT_AMBULATORY_CARE_PROVIDER_SITE_OTHER): Payer: Medicare Other | Admitting: Family Medicine

## 2022-12-20 ENCOUNTER — Encounter: Payer: Self-pay | Admitting: Family Medicine

## 2022-12-20 VITALS — BP 114/74 | HR 68 | Ht 64.0 in | Wt 180.6 lb

## 2022-12-20 DIAGNOSIS — Z131 Encounter for screening for diabetes mellitus: Secondary | ICD-10-CM

## 2022-12-20 DIAGNOSIS — R3915 Urgency of urination: Secondary | ICD-10-CM | POA: Diagnosis not present

## 2022-12-20 DIAGNOSIS — R5383 Other fatigue: Secondary | ICD-10-CM

## 2022-12-20 DIAGNOSIS — Z0000 Encounter for general adult medical examination without abnormal findings: Secondary | ICD-10-CM | POA: Diagnosis not present

## 2022-12-20 DIAGNOSIS — E785 Hyperlipidemia, unspecified: Secondary | ICD-10-CM

## 2022-12-20 DIAGNOSIS — Z5181 Encounter for therapeutic drug level monitoring: Secondary | ICD-10-CM

## 2022-12-20 DIAGNOSIS — I10 Essential (primary) hypertension: Secondary | ICD-10-CM

## 2022-12-20 DIAGNOSIS — R42 Dizziness and giddiness: Secondary | ICD-10-CM

## 2022-12-20 LAB — POCT URINALYSIS DIP (PROADVANTAGE DEVICE)
Blood, UA: NEGATIVE
Glucose, UA: NEGATIVE mg/dL
Nitrite, UA: NEGATIVE
Protein Ur, POC: NEGATIVE mg/dL
Specific Gravity, Urine: 1.015
Urobilinogen, Ur: 0.2
pH, UA: 6 (ref 5.0–8.0)

## 2022-12-20 NOTE — Patient Instructions (Addendum)
  HEALTH MAINTENANCE RECOMMENDATIONS:  It is recommended that you get at least 30 minutes of aerobic exercise at least 5 days/week (for weight loss, you may need as much as 60-90 minutes). This can be any activity that gets your heart rate up. This can be divided in 10-15 minute intervals if needed, but try and build up your endurance at least once a week.  Weight bearing exercise is also recommended twice weekly.  Eat a healthy diet with lots of vegetables, fruits and fiber.  "Colorful" foods have a lot of vitamins (ie green vegetables, tomatoes, red peppers, etc).  Limit sweet tea, regular sodas and alcoholic beverages, all of which has a lot of calories and sugar.  Up to 1 alcoholic drink daily may be beneficial for women (unless trying to lose weight, watch sugars).  Drink a lot of water.  Calcium recommendations are 1200-1500 mg daily (1500 mg for postmenopausal women or women without ovaries), and vitamin D 1000 IU daily.  This should be obtained from diet and/or supplements (vitamins), and calcium should not be taken all at once, but in divided doses.  Monthly self breast exams and yearly mammograms for women over the age of 13 is recommended.  Sunscreen of at least SPF 30 should be used on all sun-exposed parts of the skin when outside between the hours of 10 am and 4 pm (not just when at beach or pool, but even with exercise, golf, tennis, and yard work!)  Use a sunscreen that says "broad spectrum" so it covers both UVA and UVB rays, and make sure to reapply every 1-2 hours.  Remember to change the batteries in your smoke detectors when changing your clock times in the spring and fall. Carbon monoxide detectors are recommended for your home.  Use your seat belt every time you are in a car, and please drive safely and not be distracted with cell phones and texting while driving.  You are due to see your cardiologist, please call to schedule.  Please get your tetanus booster (TdaP) from the  pharmacy (this was due in 2023).  I recommend getting the new shingles vaccine (Shingrix). Since you have Medicare, you will need to get this from the pharmacy, as it is covered by Part D. This is a series of 2 injections, spaced 2 months apart.   This should be separated from other vaccines by at least 2 weeks.  Yearly flu shots and COVID booster is recommended.  I recommend getting a routine eye exam.  We are sending your urine for culture and will let you know the results via MyChart (and send in a prescription if there is an infection). Today and yesterday's urine look pretty clear. Pay attention to whether or not your urinary symptoms occur more on the days that you take the furosemide, vs not.  You did not appear to have any positional vertigo on today's exam. You do have some nasal congestion and probable eustachian tube dysfunction with some retraction of the ear drum (no fluid behind it). These can indicate congestion--either from a virus or allergies. Consider using antihistamines like claritin or zyrtec. Use the meclizine (antivert) only if needed for recurrent vertigo or equilibrium issues. Start the prednisone if you have increased head congestion, sinus pressure, ear discomfort, or persistent vertigo despite the meclizine.

## 2022-12-21 LAB — LIPID PANEL
Chol/HDL Ratio: 3 ratio (ref 0.0–4.4)
Cholesterol, Total: 161 mg/dL (ref 100–199)
HDL: 54 mg/dL (ref >39)
LDL Chol Calc (NIH): 90 mg/dL (ref 0–99)
Triglycerides: 91 mg/dL (ref 0–149)
VLDL Cholesterol Cal: 17 mg/dL (ref 5–40)

## 2022-12-21 LAB — HEPATIC FUNCTION PANEL
ALT: 13 IU/L (ref 0–32)
AST: 13 IU/L (ref 0–40)
Albumin: 4.3 g/dL (ref 3.8–4.9)
Alkaline Phosphatase: 79 IU/L (ref 44–121)
Bilirubin Total: 0.3 mg/dL (ref 0.0–1.2)
Bilirubin, Direct: <0.1 mg/dL (ref 0.00–0.40)
Total Protein: 6.6 g/dL (ref 6.0–8.5)

## 2022-12-21 LAB — TSH: TSH: 1.13 u[IU]/mL (ref 0.450–4.500)

## 2022-12-21 LAB — GLUCOSE, RANDOM: Glucose: 88 mg/dL (ref 70–99)

## 2022-12-22 LAB — URINE CULTURE

## 2022-12-25 ENCOUNTER — Telehealth: Payer: Self-pay | Admitting: Family Medicine

## 2022-12-25 NOTE — Telephone Encounter (Signed)
Pt called and states she saw you after a recent ER visit. She was seen in the ER for vertigo. She states that the medication she was given is not helping her a lot. She is still having lots of issues especially in the car. She is requesting a patch be sent in for her. She is hoping that might help. Pt can be reached at [phone]. Pt uses [facility] on [address].

## 2022-12-25 NOTE — Telephone Encounter (Signed)
Is she using antivert TID? Did she take the prednisone as was prescribed? I would try these things before starting a patch, based on her exam (as prescribed by the ER; the meclizine is OTC as we discussed).  She hasn't viewed her MyChart results either, please advise.

## 2022-12-25 NOTE — Telephone Encounter (Signed)
Spoke with patient and she is using the antivert TID and she said is not working at all. She also started the prednisone, said she has one dose left. Went over all lab results with her.

## 2022-12-25 NOTE — Telephone Encounter (Signed)
It isn't clear to me what ongoing symptoms she is having.  She actually didn't have vertigo when she was here for physical, just "felt a little off"  She may need to have a f/u visit this week, depending on symptoms, and if changing/worsening. Concerns would be off-balance, falling, severe vertigo, any focal weakness (concern for stroke, poss need for MRI)

## 2022-12-26 NOTE — Progress Notes (Signed)
Chief Complaint  Patient presents with   Dizziness    Dizziness, off balance. Head feels hazy.   Patient presents for f/u on concerns of dizziness and balance.  She is still having dizziness and feels off balance. She almost fell 2 days ago. Feels a "haziness" in her head.  She was evaluated in the ED on 5/22 for complaints of vertigo.  Apparently she had improved some with the dose of meclizine given in the ER.  She was seen the following day for her physical. At that time she reported some fullness in her head, off balance, "it is not right".  Hadn't had any vertigo that day. She hadn't yet started the prednisone nor taken any additional meclizine at that point. On exam, she was noted to have some TM retraction on the right, and nasal mucosal edema, R>L. She was encouraged to take the prednisone course, and use meclizine as needed for dizziness.  She has now completed the prednisone,  It might have helped with her ear. She isn't sure. She has been taking meclizine TID. She is able to walk by herself (wasn't able to with first episode), so it a little better, but still not feeling right.  She feels like the dizziness starts at side of her head, affecting her ear first.--that is feeling LH with some movement, not a fast spinning. This happens regardless of head position. This occurs frequently throughout the day, lasts for a minute or so. Doesn't occur at night or wake her up. Denies HA currently, just some pressure at top of head.  Yesterday, while seated on porch, she had a little different experience. She felt something came across her head (forehead)--felt dizzy.  Lightheaded.  Denies spinning, just LH.  She has some occasional pain across her temples and forehead, short-lived pains.  Last night, she noticed some numbness in L arm/shoulder, resolved on its own.  No associated neck pain.  Had the "episode" of ear fullness/dizziness, but no headache at that time.  She feels like there is  pressure in her head, feels cloudy, lightheaded.  Hypertension:  compliant with meds.  BP's have been good, so not checking regularly at home.  Currently reports 7/10 back pain (improved from earlier, with some advil).  Denies missed pills or any headache currently.  BP Readings from Last 3 Encounters:  12/27/22 (!) 170/110  12/20/22 114/74  12/19/22 128/80     PMH, PSH, SH reviewed  Outpatient Encounter Medications as of 12/27/2022  Medication Sig Note   ascorbic acid (VITAMIN C) 250 MG tablet Take 500 mg by mouth daily.    atorvastatin (LIPITOR) 20 MG tablet Take 1 tablet (20 mg total) by mouth daily.    Calcium Carbonate-Vitamin D (CALCIUM PLUS VITAMIN D PO) Take 2 capsules by mouth daily. 12/27/2022: Unsure of strength    diltiazem (CARDIZEM CD) 180 MG 24 hr capsule TAKE 1 CAPSULE(180 MG) BY MOUTH DAILY    esomeprazole (NEXIUM) 20 MG capsule Take 20 mg by mouth daily at 12 noon. 12/27/2022: Takes OTC Nexium once daily     furosemide (LASIX) 40 MG tablet TAKE 1 TABLET(40 MG) BY MOUTH DAILY 12/27/2022: Takes every other day     gabapentin (NEURONTIN) 600 MG tablet Take 600 mg by mouth at bedtime.    ibuprofen (ADVIL) 200 MG tablet Take 600 mg by mouth every 6 (six) hours as needed. 12/27/2022: 600mg  this am   lisinopril (ZESTRIL) 5 MG tablet TAKE 1 TABLET(5 MG) BY MOUTH DAILY  meclizine (ANTIVERT) 25 MG tablet Take 1 tablet (25 mg total) by mouth 3 (three) times daily as needed for dizziness. 12/27/2022: Took one this am   Multiple Vitamins-Minerals (MULTIVITAMIN WITH MINERALS) tablet Take 1 tablet by mouth daily.    potassium chloride SA (KLOR-CON M) 20 MEQ tablet TAKE 1 TABLET BY MOUTH EVERY OTHER DAY    Turmeric (QC TUMERIC COMPLEX PO) Take by mouth daily.    vitamin E 400 UNIT capsule Take 400 Units by mouth daily.    lidocaine (LIDODERM) 5 % Place 1 patch onto the skin daily as needed. (Patient not taking: Reported on 12/27/2022) 12/27/2022: Ran out   methocarbamol (ROBAXIN) 500  MG tablet Take 500 mg by mouth as needed for muscle spasms. (Patient not taking: Reported on 12/20/2022) 12/27/2022: As needed   nabumetone (RELAFEN) 500 MG tablet Take 500 mg by mouth 2 (two) times daily. (Patient not taking: Reported on 07/05/2022) 12/27/2022: As needed    predniSONE (DELTASONE) 20 MG tablet Take 2 tablets (40 mg total) by mouth daily. (Patient not taking: Reported on 12/20/2022) 12/27/2022: finished   No facility-administered encounter medications on file as of 12/27/2022.   Allergies  Allergen Reactions   Adhesive [Tape] Hives   Morphine Hives    ROS:  no fever, chills. Denies any significant URI symptoms, cough. No chest pain, shortness o breath. No headache. No numbness, tingling (just in L arm last night) or focal weakness. Chronic back pain--took ibuprofen this morning. She states 7/10 pain currently, states ibuprofen took the edge off.    PHYSICAL EXAM:  BP (!) 170/110   Pulse 64   Ht 5\' 4"  (1.626 m)   Wt 183 lb 9.6 oz (83.3 kg)   BMI 31.51 kg/m   Orthostatic Vitals for the past 48 hrs (Last 6 readings):  BP Pulse BP Location BP Method Cuff Size Patient Position (if appropriate)  12/27/22 1028 (!) 150/84 64 -- -- -- --  12/27/22 1042 130/70 64 Right Arm Manual Normal Lying  12/27/22 1047 (!) 170/110 64 -- -- -- --    142/84 on repeat by MD, seated, L arm.  Wt Readings from Last 3 Encounters:  12/27/22 183 lb 9.6 oz (83.3 kg)  12/20/22 180 lb 9.6 oz (81.9 kg)  12/19/22 165 lb (74.8 kg)   Pleasant female, appears tired. She does not appear to be in any discomfort. HEENT: conjunctiva and sclera are clear, EOMI. No nystagmus. Fundi benign. L TM retracted mildly, no erythema, right TM and EAC is normal.   There is mod edema of nasal mucosa, L>R, slight clear mucus on the left. Sinuses are nontender. OP is clear (braces, rubberband on the right). Neck: no c-spine tenderness, no lymphadenopathy, thyromegaly or mass Heart: regular rate and rhythm Lungs: clear  bilaterally Abdomen: soft, nontender Back: no spinal or CVA tenderness Neuro: alert and oriented. Cranial nerves 2-12 intact. Normal strength, sensation, DTR's 1+ and symmetric. Normal finger to nose, heel to shin. Normal gait. Psych: normal mood, affect, hygiene and grooming    ASSESSMENT/PLAN:  Vertigo - True vertigo hasn't recurred. Still feels off balance. Meclizine not helping, ?sedating. Will try patch (no harm, tolerated in past) - Plan: scopolamine (TRANSDERM-SCOP) 1 MG/3DAYS, Ambulatory referral to ENT, Ambulatory referral to Neurology  Dysfunction of left eustachian tube - suspect this is contributing, but no improvement with prednisone course. Will try flonase - Plan: fluticasone (FLONASE) 50 MCG/ACT nasal spray  Ear fullness, left - has e/o ETD, retracted TM. Ddx reviewed, doubt Meniere's.  Will refer to ENT for eval - Plan: Ambulatory referral to ENT  Balance problems - Ddx reviewed.  Will seek neuro and ENT evals and hope that something helps her symptoms. ? need for MRI to r/o CVA. Neuro exam pretty normal - Plan: Ambulatory referral to Neurology  Essential hypertension, benign - BP elevated today, normal at last visit. Pain may be factor. Monitor BP at home, cont current meds.   Not orthostatic Not worse Neuro exam still nl Findings c/w ETD/allergies on PE  Recent CT showed no acute findings, but did have the following finding: Sinuses/Orbits: No middle ear or mastoid effusion. Polypoid mucosal thickening in the right maxillary sinus.   Transderm scop--prev tolerated w/o side effects on cruise. Wouldn't hurt to try this and see if it is more helpful than the meclizine. Encouraged to use Flonase and zyrtec Neuro referral, ENT referrals discussed.   Please start using flonase daily--2 gentle sniffs into each nostril, once daily. Also restart using zyrtec every night. Stop taking the meclizine (it doesn't seem to be helping). Be sure that you are drinking plenty  of water. Monitor your blood pressure and let us know if it stays elevated. Limit the salt/sodium in the diet. We will refer you to neurology for evaluation (we briefly discussed ENT, but will start with neuro). We will try the patches to see if they help any more than the meclizine, as far as your dizzy/cloudy feeling (since you have had these in the past without side effects, I don't think there is any harm).

## 2022-12-26 NOTE — Telephone Encounter (Signed)
Spoke with patient. She said she was having dizzy spells and cloudiness when she was here that she failed to mention. She is still having dizziness and feels off balance. She almost fell yesterday. Feels a "haziness" in her head. I put her on schedule for tomorrow at 10:30 (we had no 30 min appts).

## 2022-12-27 ENCOUNTER — Encounter: Payer: Self-pay | Admitting: Family Medicine

## 2022-12-27 ENCOUNTER — Ambulatory Visit (INDEPENDENT_AMBULATORY_CARE_PROVIDER_SITE_OTHER): Payer: Medicare Other | Admitting: Family Medicine

## 2022-12-27 VITALS — BP 170/110 | HR 64 | Ht 64.0 in | Wt 183.6 lb

## 2022-12-27 DIAGNOSIS — H6992 Unspecified Eustachian tube disorder, left ear: Secondary | ICD-10-CM

## 2022-12-27 DIAGNOSIS — R2689 Other abnormalities of gait and mobility: Secondary | ICD-10-CM

## 2022-12-27 DIAGNOSIS — R42 Dizziness and giddiness: Secondary | ICD-10-CM

## 2022-12-27 DIAGNOSIS — H938X2 Other specified disorders of left ear: Secondary | ICD-10-CM

## 2022-12-27 DIAGNOSIS — I10 Essential (primary) hypertension: Secondary | ICD-10-CM

## 2022-12-27 MED ORDER — SCOPOLAMINE 1 MG/3DAYS TD PT72
1.0000 | MEDICATED_PATCH | TRANSDERMAL | 0 refills | Status: DC
Start: 2022-12-27 — End: 2023-07-05

## 2022-12-27 MED ORDER — FLUTICASONE PROPIONATE 50 MCG/ACT NA SUSP
2.0000 | Freq: Every day | NASAL | 6 refills | Status: AC
Start: 2022-12-27 — End: ?

## 2022-12-27 NOTE — Patient Instructions (Signed)
  Please start using flonase daily--2 gentle sniffs into each nostril, once daily. Also restart using zyrtec every night. Stop taking the meclizine (it doesn't seem to be helping). Be sure that you are drinking plenty of water. Monitor your blood pressure and let us know if it stays elevated. Limit the salt/sodium in the diet. We will refer you to neurology for evaluation (we briefly discussed ENT, but will start with neuro). We will try the patches to see if they help any more than the meclizine, as far as your dizzy/cloudy feeling (since you have had these in the past without side effects, I don't think there is any harm).

## 2023-01-09 ENCOUNTER — Encounter: Payer: Self-pay | Admitting: Neurology

## 2023-01-09 ENCOUNTER — Ambulatory Visit: Payer: Medicare Other | Admitting: Neurology

## 2023-01-09 VITALS — BP 125/80 | HR 68 | Ht 65.5 in | Wt 182.2 lb

## 2023-01-09 DIAGNOSIS — R42 Dizziness and giddiness: Secondary | ICD-10-CM

## 2023-01-09 DIAGNOSIS — H918X9 Other specified hearing loss, unspecified ear: Secondary | ICD-10-CM

## 2023-01-09 DIAGNOSIS — H538 Other visual disturbances: Secondary | ICD-10-CM | POA: Diagnosis not present

## 2023-01-09 DIAGNOSIS — H938X9 Other specified disorders of ear, unspecified ear: Secondary | ICD-10-CM

## 2023-01-09 NOTE — Patient Instructions (Signed)
Unfortunately, dizziness is a very common complaint but is often not due to a primary neurological reason or single underlying medical problem. Often, there a combination of factors, that result in dizziness. This includes blood pressure fluctuations, medication side effects, blood sugar fluctuations, stress, vertigo, poor sleep with sleep deprivation, dehydration, and electrolyte disturbance or other metabolic and endocrinological reasons, meaning hormone related problems such as thyroid dysfunction.  Your dizziness may be from a combination of factors.  Some of your medications can cause dizziness.  Please review with your pain management doctor.  I recommend that you talk to your primary care about seeing an ear, nose and throat specialist to investigate inner ear/vestibular dysfunction.    I also recommend that you schedule an eye exam as you did not have 1 last year and you are overdue.  Specially since you had blurry vision, you should get a full eye exam.    I recommend that you try to hydrate well, about 4 bottles of water per day are recommended, 16.9 ounce size each.  I would recommend you avoid overhydration.  We will investigate things further with a brain MRI. We will call you with the test results.   Once the MRI is done, we will call you with the results and I would recommend you talk to your primary care about pursuing physical therapy for dizziness.  Vestibular rehab can help.    If your brain MRI looks benign, we can see you back in this clinic as needed.

## 2023-01-09 NOTE — Progress Notes (Signed)
Subjective:    Patient ID: Olivia [name] is a 57 y.o. female.  HPI    Huston Foley, MD, PhD [facility] [address], [address] [address] [place], [place] [address]  Dear Dr. Lynelle Doctor,  I saw your patient, Olivia [name], upon your kind request in my neurologic clinic today for initial consultation of her dizziness and vertigo symptoms.  The patient is accompanied husband today.  As you know, Olivia [name] is a 57 year old female with an underlying medical history of hypertension, breast cancer with status post lumpectomy and chemoradiation, right atrial thrombus, uterine fibroid, lumbar degenerative disc disease with status post lumbar laminectomy and microdiscectomy, and obesity, who reports a 3-week history of intermittent dizzy spells.  She describes different symptoms including lightheadedness, ear fullness, muffled hearing, blurry vision, spinning sensation not necessarily associated with body position changes, and head fullness.  She is sensitive to sounds at times.  No history of migraines, no nausea or vomiting, no falls.  Symptoms came on suddenly in the evening about 3 weeks ago, she was exiting a store.  She felt weaker all over, she felt numbness in both legs, she had numbness in the left face.  Her husband took her to the ER.  She has not seen ENT yet.  She denies any residual numbness, this resolved after about 30 minutes.  She has not been sleeping very well, she snores very little per husband.  She had a sleep study some years ago and was negative for sleep apnea.  She hydrates with water, drinks quite a bit of water, about 6+ bottles per day, 16.9 ounce size each.  Very little alcohol, usually less than once every 1 or 2 months.  She is a non-smoker.  She is followed by pain management for chronic back pain and is on Robaxin 500 mg daily as needed.  She usually takes it at night, on most nights.  She has been on gabapentin 600 mg at bedtime.  She has been on it for  years.  She is currently not on any narcotic pain medication.  Meclizine did not help but made her sleepy.  She has found no triggers and no alleviating factors.  She has no severe headache, no sudden onset of one-sided weakness.  Symptoms of dizziness are intermittent, can be short-lived for minutes or longer, happen nearly daily.  I reviewed your office note from 12/27/2022.  Her blood pressure was highly elevated at the time, 170/110.  She reported lightheadedness at the time as well as pressure in her head.  She was not noted to have true vertigo at the time, she felt off balance.  Meclizine was not helping.  She was given a scopolamine patch.  Of note, she was seen in the emergency room on 12/18/2020 for dizziness.  I reviewed the emergency room records.  She had workup including EKG, BMP, CBC, urinalysis.  She also had a head CT without contrast on 12/19/2022 and I reviewed the results: Impression: No acute intracranial abnormality.  She was given a prescription for meclizine and a prescription for prednisone.  She was advised to follow-up with ENT if symptoms did not improve.  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  She has since then finished her prednisone course.  She had recent blood work on 12/19/2022 as well as 12/20/2022 and I reviewed the results.  TSH was 1.13 on 12/20/2022, urinalysis showed trace ketones, trace leukocytes.  Lipid panel was benign with total cholesterol of  161, triglycerides 91, HDL 54, LDL 90, liver panel benign, alk phos was 79, AST 13, ALT 13.  She has not seen her cardiologist.  She sees cardiology once a year typically.  She has not had a recent eye examination, did not have her yearly exam last year.  She does not have any prescription eyeglasses.  Her Past Medical History Is Significant For: Past Medical History:  Diagnosis Date   Breast cancer, stage 1 (HCC)    Colon polyp    GERD (gastroesophageal reflux disease)    H/O diastolic dysfunction 05/2013    Grade 2 DD. 1.3 x 1.3 cm mass in RA on Echo -->Cardiac MRI 12/04: 1.9 x 1.6 cm mass in the lateral RA cavity. -- Most suggestive of thrombus--  calcified and  lamina and ted suggestive of chronic.; EF from 50% with no scar.   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   Uterine fibroid    s/p embolization    Her Past Surgical History Is Significant For: Past Surgical History:  Procedure Laterality Date   BREAST BIOPSY Right 12/31/2012   BREAST BIOPSY Right 09/06/2008   BREAST BIOPSY Right 08/11/2008   BREAST LUMPECTOMY  2010   right   Cardiac MRI  December 2014   1.9 x 1.6 cm mass in the lateral RA cavity. -- Most suggestive of thrombus--  calcified anndd  laammiinnaated suggestive of chronic.; EF from 50-4% with no scar. Trivial effusion   COLONOSCOPY  2010   LUMBAR LAMINECTOMY/DECOMPRESSION MICRODISCECTOMY  2007, 2008   Dr. Gerlene Fee x 2   POWER PORT     TRANSTHORACIC ECHOCARDIOGRAM  November 2014; March 2016t   a) Mild LVH, EF of roughly 50%. Pseudo-normal LV filling (grade 3 diastolic soft. Mild LA dilation. 1.3 x 1.3 cm mass in right atrium.;; b) Low normal LV function; grade 2 diastolic dysfunction; calcified right atrial mass; no change compared to 06/11/13.   TUBAL LIGATION  2010   UTERINE FIBROID EMBOLIZATION  2008 or 2009    Her Family History Is Significant For: Family History  Problem Relation Age of Onset   Breast cancer Mother    Heart disease Father        late 39's, stent   Diabetes Father    Cancer Maternal Grandmother        renal cancer    Her Social History Is Significant For: Social History    Socioeconomic History   Marital status: Married    Spouse name: Not on file   Number of children: Not on file   Years of education: Not on file   Highest education level: Not on file  Occupational History   Not on file  Tobacco Use   Smoking status: Never   Smokeless tobacco: Never  Vaping Use   Vaping Use: Never used  Substance and Sexual Activity   Alcohol use: No    Comment: occasionally   Drug use: No   Sexual activity: Yes  Other Topics Concern   Not on file  Social History Narrative   She is married. Has 1 son (who is married, has  3 kids).  Total of  5 grandchildren between her and her husband   She walks with a cane sometimes (when back pain flaring). She is currently disabled (back pain, neuropathy, chronic pain).   Never smoked.   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      Updated 11/2022   Social Determinants of Health   Financial Resource Strain: Low Risk  (12/20/2022)   Overall Financial Resource Strain (CARDIA)    Difficulty of Paying Living Expenses: Not hard at all  Food Insecurity: No Food Insecurity (12/20/2022)   Hunger Vital Sign    Worried About Running Out of Food in the Last Year: Never true    Ran Out of Food in the Last Year: Never true  Transportation Needs: No Transportation Needs (12/20/2022)   PRAPARE - Administrator, Civil Service (Medical): No    Lack of Transportation (Non-Medical): No  Physical Activity: Not on file  Stress: No Stress Concern Present (12/20/2022)   [employer] of Occupational Health - Occupational Stress Questionnaire    Feeling of Stress : Not at all  Social Connections: Socially Integrated (12/20/2022)   Social Connection and Isolation Panel [NHANES]    Frequency of Communication with Friends and Family: More than three times a week    Frequency of Social Gatherings with Friends and Family: Not  on file    Attends Religious Services: More than 4 times per year    Active Member of [employer] or Organizations: Yes    Attends Engineer, structural: More than 4 times per year    Marital Status: Married    Her Allergies Are:  Allergies  Allergen Reactions   Adhesive [Tape] Hives   Morphine Hives  :   Her Current Medications Are:  Outpatient Encounter Medications as of 01/09/2023  Medication Sig   ascorbic acid (VITAMIN C) 250 MG tablet Take 500 mg by mouth daily.   atorvastatin (LIPITOR) 20 MG tablet Take 1 tablet (20 mg total) by mouth daily.   Calcium Carbonate-Vitamin D (CALCIUM PLUS VITAMIN D PO) Take 2 capsules by mouth daily.   diltiazem (CARDIZEM CD) 180 MG 24 hr capsule TAKE 1 CAPSULE(180 MG) BY MOUTH DAILY   esomeprazole (NEXIUM) 20 MG capsule Take 20 mg by mouth daily at 12 noon.   fluticasone (FLONASE) 50 MCG/ACT nasal spray Place 2 sprays into both nostrils daily.   furosemide (LASIX) 40 MG tablet TAKE 1 TABLET(40 MG) BY MOUTH DAILY   gabapentin (NEURONTIN) 600 MG tablet Take 600 mg by mouth at bedtime.   ibuprofen (ADVIL) 200 MG tablet Take 600 mg by mouth every 6 (six) hours as needed.   lidocaine (LIDODERM) 5 % Place 1 patch onto the skin daily as needed.   lisinopril (ZESTRIL) 5 MG tablet TAKE 1 TABLET(5 MG) BY MOUTH DAILY   meclizine (ANTIVERT) 25 MG tablet Take 1 tablet (25 mg total) by mouth 3 (three) times daily as needed for dizziness.   methocarbamol (ROBAXIN) 500 MG tablet Take 500 mg by mouth as needed for muscle spasms.   Multiple Vitamins-Minerals (MULTIVITAMIN WITH MINERALS) tablet Take 1 tablet by mouth daily.   nabumetone (RELAFEN) 500 MG tablet Take 500 mg by mouth 2 (two) times daily.   potassium chloride SA (KLOR-CON M) 20 MEQ tablet TAKE 1 TABLET BY MOUTH EVERY OTHER DAY   Turmeric (QC TUMERIC COMPLEX PO) Take by mouth daily.  vitamin E 400 UNIT capsule Take 400 Units by mouth daily.   predniSONE (DELTASONE) 20 MG tablet Take 2 tablets (40 mg  total) by mouth daily. (Patient not taking: Reported on 01/09/2023)   scopolamine (TRANSDERM-SCOP) 1 MG/3DAYS Place 1 patch (1.5 mg total) onto the skin every 3 (three) days. (Patient not taking: Reported on 01/09/2023)   No facility-administered encounter medications on file as of 01/09/2023.  :   Review of Systems:  Out of a complete 14 point review of systems, all are reviewed and negative with the exception of these symptoms as listed below:  Review of Systems  Neurological:        Having dizziness since 12-19-2022.  Had ED visit L hearing loss, took prednisone helped.  Now with R ear hearing loss,  (muffled), feels frontal head pressure prior to vertigo.  Has not seen ENT.      Objective:  Neurological Exam  Physical Exam Physical Examination:   Vitals:   01/09/23 0758  BP: 125/80  Pulse: 68   No orthostatic blood pressure drop.  She felt mildly lightheaded upon standing.  General Examination: The patient is a very pleasant 57 y.o. female in no acute distress. She appears well-developed and well-nourished and well groomed.   HEENT: Normocephalic, atraumatic, pupils are equal, round and reactive to light, extraocular tracking is good without limitation to gaze excursion or nystagmus noted. Hearing is grossly intact.  Tympanic membranes clear bilaterally, no swelling, no redness, no blisters in the ear canal, no cerumen impaction.  Face is symmetric with normal facial animation. Speech is clear with no dysarthria noted. There is no hypophonia. There is no lip, neck/head, jaw or voice tremor. Neck is supple with full range of passive and active motion. There are no carotid bruits on auscultation. Oropharynx exam reveals: mild mouth dryness, adequate dental hygiene and mild airway crowding, due to small airway entry, tonsils on the smaller side, Mallampati class II.  Tongue protrudes centrally and palate elevates symmetrically.  Chest: Clear to auscultation without wheezing, rhonchi or  crackles noted.  Heart: S1+S2+0, regular and normal without murmurs, rubs or gallops noted.   Abdomen: Soft, non-tender and non-distended.  Extremities: There is no pitting edema in the distal lower extremities bilaterally.   Skin: Warm and dry without trophic changes noted.   Musculoskeletal: exam reveals no obvious joint deformities.   Neurologically:  Mental status: The patient is awake, alert and oriented in all 4 spheres. Her immediate and remote memory, attention, language skills and fund of knowledge are appropriate. There is no evidence of aphasia, agnosia, apraxia or anomia. Speech is clear with normal prosody and enunciation. Thought process is linear. Mood is normal and affect is normal.  Cranial nerves II - XII are as described above under HEENT exam.  Motor exam: Normal bulk, strength and tone is noted.  No drift or rebound, no postural tremor.  There is no obvious action or resting tremor.  Fine motor skills and coordination: intact finger taps, hand movements and rapid alternating padding in the upper extremities, normal foot taps bilaterally in the lower extremities.  Cerebellar testing: No dysmetria or intention tremor. There is no truncal or gait ataxia.  Normal finger-to-nose, normal heel-to-shin. Reflexes 1+ in the upper extremities and diminished in the lower extremities, toes are downgoing bilaterally.  Sensory exam: intact to light touch in the upper and lower extremities.  Gait, station and balance: She stands slowly and initially stands slightly wider base.  She can stand narrow-based.  Romberg shows little sway but no corrective steps.  Tandem walk is slow and cautious but doable.  She walks without a walking aid.  Posture is age-appropriate, walks slightly slowly and cautiously, preserved arm swing noted.  Assessment and Plan:  In summary, SHAKEA BONILLAS is a very pleasant 57 y.o.-year old female with an underlying medical history of hypertension, breast cancer  with status post lumpectomy and chemoradiation, right atrial thrombus, uterine fibroid, lumbar degenerative disc disease with status post lumbar laminectomy and microdiscectomy, and obesity, who presents for evaluation of her intermittent, nearly daily dizziness of approximately 3 weeks duration.  Neurological exam is nonfocal.  In particular, no weakness, no cerebellar dysfunction, no tremor, no parkinsonism.  She is largely reassured, nevertheless, I would like to investigate for a structural cause of her symptoms with a brain MRI with and without contrast.  We had a long discussion about causes of dizziness, often dizziness is due to a combination of factors.  Medication side effects is a possibility even though she has been on the same medications for years as I understand.  Overhydration can be a factor, blood pressure fluctuation, sleep deprivation, vestibular and inner ear dysfunction.   I suggested we proceed with a brain MRI with and without contrast, we will call her with her results.   If the MRI is benign, she is advised to talk to you about doing physical therapy.   She is encouraged to touch base with cardiology about dizziness and possibly schedule an appointment sooner than planned.   She is advised to talk to about a referral to ENT.   She is advised to schedule an eye exam, she has not had one in over 1 year and has had blurry vision.   She is advised to hydrate well but avoid excessive hydration.   If the MRI shows benign findings, we can follow her in this clinic on an as-needed basis.   This was an extended visit of over 1 hour with extended chart review involved and multiple recommendations, counseling and coordination of care. I answered all their questions today and the patient and her husband were in agreement with the plan. Thank you very much for allowing me to participate in the care of this nice patient. If I can be of any further assistance to you please do not hesitate to  call me at [phone].  Sincerely,   Huston Foley, MD, PhD

## 2023-01-10 ENCOUNTER — Telehealth: Payer: Self-pay | Admitting: Neurology

## 2023-01-10 NOTE — Telephone Encounter (Signed)
[employer] medicare Berkley Harvey: [id number] exp. 01/10/23-02/08/23 sent to GI [phone]

## 2023-01-18 ENCOUNTER — Ambulatory Visit
Admission: RE | Admit: 2023-01-18 | Discharge: 2023-01-18 | Disposition: A | Discharge status: Home / Self Care | Payer: Medicare Other | Source: Ambulatory Visit | Attending: Neurology | Admitting: Neurology

## 2023-01-18 DIAGNOSIS — H918X9 Other specified hearing loss, unspecified ear: Secondary | ICD-10-CM

## 2023-01-18 DIAGNOSIS — H938X9 Other specified disorders of ear, unspecified ear: Secondary | ICD-10-CM | POA: Diagnosis not present

## 2023-01-18 DIAGNOSIS — R42 Dizziness and giddiness: Secondary | ICD-10-CM

## 2023-01-18 DIAGNOSIS — H538 Other visual disturbances: Secondary | ICD-10-CM

## 2023-01-18 MED ORDER — GADOPICLENOL 0.5 MMOL/ML IV SOLN
8.0000 mL | Freq: Once | INTRAVENOUS | Status: AC | PRN
  Administered 2023-01-18: 8 mL via INTRAVENOUS

## 2023-01-22 ENCOUNTER — Telehealth: Payer: Self-pay

## 2023-01-22 NOTE — Telephone Encounter (Signed)
I left the patient a voicemail (as per DPR) detailing results of the recent MRI. A mychart msg has been sent as well.

## 2023-01-22 NOTE — Telephone Encounter (Signed)
-----   Message from Huston Foley, MD sent at 01/21/2023  5:09 PM EDT ----- Please call patient and advise her that her recent brain MRI with and without contrast did not show any acute findings, chronic changes were seen, and stable findings compared to her brain MRIs from 2010 and 2015.

## 2023-02-15 DIAGNOSIS — R42 Dizziness and giddiness: Secondary | ICD-10-CM | POA: Diagnosis not present

## 2023-02-15 DIAGNOSIS — H938X3 Other specified disorders of ear, bilateral: Secondary | ICD-10-CM | POA: Diagnosis not present

## 2023-03-14 ENCOUNTER — Other Ambulatory Visit: Payer: Self-pay | Admitting: Obstetrics and Gynecology

## 2023-03-14 DIAGNOSIS — Z01419 Encounter for gynecological examination (general) (routine) without abnormal findings: Secondary | ICD-10-CM | POA: Diagnosis not present

## 2023-03-14 DIAGNOSIS — Z1231 Encounter for screening mammogram for malignant neoplasm of breast: Secondary | ICD-10-CM

## 2023-03-16 ENCOUNTER — Other Ambulatory Visit: Payer: Self-pay | Admitting: Cardiology

## 2023-03-18 ENCOUNTER — Other Ambulatory Visit: Payer: Self-pay | Admitting: Cardiology

## 2023-03-19 LAB — HM PAP SMEAR: HPV, high-risk: NEGATIVE

## 2023-03-22 DIAGNOSIS — H9042 Sensorineural hearing loss, unilateral, left ear, with unrestricted hearing on the contralateral side: Secondary | ICD-10-CM | POA: Diagnosis not present

## 2023-03-31 ENCOUNTER — Other Ambulatory Visit: Payer: Self-pay | Admitting: Family Medicine

## 2023-04-11 ENCOUNTER — Ambulatory Visit: Payer: Medicare Other

## 2023-04-25 ENCOUNTER — Ambulatory Visit
Admission: RE | Admit: 2023-04-25 | Discharge: 2023-04-25 | Disposition: A | Discharge status: Home / Self Care | Payer: Medicare Other | Source: Ambulatory Visit | Attending: Obstetrics and Gynecology | Admitting: Obstetrics and Gynecology

## 2023-04-25 DIAGNOSIS — Z1231 Encounter for screening mammogram for malignant neoplasm of breast: Secondary | ICD-10-CM

## 2023-04-30 ENCOUNTER — Other Ambulatory Visit: Payer: Self-pay | Admitting: Obstetrics and Gynecology

## 2023-04-30 DIAGNOSIS — N63 Unspecified lump in unspecified breast: Secondary | ICD-10-CM

## 2023-04-30 NOTE — Progress Notes (Unsigned)
Cardiology Clinic Note   Patient Name: Olivia [name] Date of Encounter: 05/02/2023  Primary Care Provider:  Joselyn Arrow, MD Primary Cardiologist:  Bryan Lemma, MD  Patient Profile    Olivia [name] 57 year old female presents the clinic today for follow-up evaluation of her hypertension and chronic diastolic CHF.  Past Medical History    Past Medical History:  Diagnosis Date   Breast cancer, stage 1 (HCC)    Colon polyp    GERD (gastroesophageal reflux disease)    H/O diastolic dysfunction 05/2013   Grade 2 DD. 1.3 x 1.3 cm mass in RA on Echo -->Cardiac MRI 12/04: 1.9 x 1.6 cm mass in the lateral RA cavity. -- Most suggestive of thrombus--  calcified and  lamina and ted suggestive of chronic.; EF from 50% with no scar.   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   Uterine fibroid    s/p embolization   Past Surgical History:  Procedure Laterality Date   BREAST BIOPSY Right 12/31/2012   BREAST BIOPSY Right 09/06/2008   BREAST BIOPSY Right 08/11/2008   BREAST LUMPECTOMY  2010   right   Cardiac MRI  December 2014   1.9 x 1.6 cm mass in the lateral RA cavity. -- Most suggestive of thrombus--  calcified anndd  laammiinnaated suggestive of chronic.; EF from 50-4% with no scar. Trivial effusion   COLONOSCOPY  2010   LUMBAR LAMINECTOMY/DECOMPRESSION MICRODISCECTOMY  2007, 2008   Dr. Gerlene Fee x 2   POWER PORT     TRANSTHORACIC ECHOCARDIOGRAM  November 2014; March 2016t   a) Mild LVH, EF of roughly 50%. Pseudo-normal LV filling (grade 3 diastolic soft. Mild LA dilation. 1.3 x 1.3 cm mass in right atrium.;; b) Low normal LV  function; grade 2 diastolic dysfunction; calcified right atrial mass; no change compared to 06/11/13.   TUBAL LIGATION  2010   UTERINE FIBROID EMBOLIZATION  2008 or 2009    Allergies  Allergies  Allergen Reactions   Adhesive [Tape] Hives   Morphine Hives    History of Present Illness    Olivia [name] has a PMH of right atrial thrombus which was associated with her PICC line.  She also has a PMH of hypertension, chronic diastolic CHF, dyspnea on exertion, and palpitations.  She was last seen by Dr. Herbie Baltimore on 11/23/2019.  During that time she was doing well.  She continued to have swelling in her left leg which was noted to be better.  She reported that the swelling was off and on.  She also continued to have neuropathic type pains from her back surgery.  She had been trying to increase her physical activity and have been adjusting her diet.  She was trying to lose weight slowly and had lost about 10 pounds over the previous year.  She had been going to the obesity clinic and they were helping her with her diet and help in her to become more active.  Blood pressure was well controlled at home in the 110-120 systolic BP range.  Overall she was doing well and denied symptoms of significant chest  pain and pressure at rest and with exertion.  She did note some shortness of breath with rushing upstairs or carrying heavy objects.  She denied wheezing.  She did note on and off episodes of itching sharp chest discomfort in her upper chest which was not associated with activity/exertion.  She presented to the clinic 06/19/2021 for follow-up evaluation stated she felt well.  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  She noted that the coursework was difficult and had increased her stress level.  Initially her blood pressure was 153/78 and on recheck was 118/76.  We reviewed her past medical history and history of high cholesterol.  She was no longer taking rosuvastatin and  stated that she had been taking red yeast rice.  We reviewed the importance of high-fiber diet and physical activity.  She  had a sedentary lifestyle since August.  She was also not  eating as well as she was prior to August.  We  planned repeat lipids in February, I asked her to increase her physical activity with a goal of 150 minutes of moderate physical activity per week, asked her to increase the fiber in her diet, and planned follow-up in 12 months.  She presented to the clinic 11/30/21 for follow-up evaluation and stated she felt well.  She continued to study at [employer] for her interior design program.  She was no longer doing the architectural portion of the degree.    Her blood pressure was well controlled .  She continued to be physically active walking on her treadmill and helping care for her grandchildren.  She denies palpitations.  She was using some caffeine to help her with her schoolwork.  I gave her the sleep hygiene instructions, I asked her to increase her physical activity as tolerated and plan follow-up in 12 months.  She was seen in the emergency department on 12/19/2022 with vertigo.  She reported that she had been to her dentist to have her teeth cleaned.  She noted sudden lightheadedness which dissipated.  She then had a subsequent episode that was much worse.  She did note some blurry vision and reported numbness.  Her EKG showed normal sinus rhythm 75 bpm.  She was diagnosed with vertigo.  CBC and BMP were within normal limits.  Head CT was negative for acute abnormalities.  She received meclizine and was instructed to follow-up as an outpatient with ENT.  She presents to the clinic today for follow-up evaluation and states she continues to study Social research officer, government.  She has 4 semesters left.  She is now pursuing entrepreneurship as well.  She reports that her father is on hospice.  He has been in good spirits.  Her blood pressure today is some elevated initially at 140/88.  She has been out  of her medication for 2 days.  I will refill this.  She also has had some chest tightness which is stable.  Short-lived and not exertional.  On recheck her blood pressure is 136/78.  We reviewed her recent lab work.  I will plan follow-up in 12 months and give her the sleep hygiene instructions.   Today she denies chest pain, shortness of breath, lower extremity edema, fatigue, palpitations, melena, hematuria, hemoptysis, diaphoresis, weakness, presyncope, syncope, orthopnea, and PND.   Home Medications    Prior to Admission medications   Medication Sig Start Date End Date Taking? Authorizing Provider  ascorbic acid (VITAMIN C) 250 MG tablet Take 500 mg by mouth daily.  [provider]  Calcium Carbonate-Vitamin D 600-200 MG-UNIT TABS Take 2 tablets by mouth daily.    [provider]  diltiazem (CARDIZEM CD) 180 MG 24 hr capsule TAKE 1 CAPSULE BY MOUTH DAILY 12/19/20   Marykay Lex, MD  esomeprazole (NEXIUM) 20 MG capsule Take 20 mg by mouth daily at 12 noon.    [provider]  furosemide (LASIX) 40 MG tablet Take 1 tablet (40 mg total) by mouth daily. NEED OV. 04/26/21   Marykay Lex, MD  gabapentin (NEURONTIN) 600 MG tablet Take 600 mg by mouth at bedtime.    [provider]  lidocaine (LIDODERM) 5 % Place 1 patch onto the skin daily. 04/30/14   [provider]  lisinopril (ZESTRIL) 5 MG tablet TAKE 1 TABLET(5 MG) BY MOUTH DAILY 05/15/21   Marykay Lex, MD  methocarbamol (ROBAXIN) 500 MG tablet Take 500 mg by mouth as needed for muscle spasms.    [provider]  Molnupiravir 200 MG CAPS Take 4 capsules (800 mg total) by mouth in the morning and at bedtime. 03/17/21   Joselyn Arrow, MD  Multiple Vitamins-Minerals (MULTIVITAMIN WITH MINERALS) tablet Take 1 tablet by mouth daily.    [provider]  nabumetone (RELAFEN) 500 MG tablet Take 500 mg by mouth 2 (two) times daily.    [provider]  potassium chloride SA  (KLOR-CON) 20 MEQ tablet TAKE 1 TABLET BY MOUTH EVERY OTHER DAY 05/22/21   Marykay Lex, MD  vitamin E 400 UNIT capsule Take 400 Units by mouth daily.    [provider]    Family History    Family History  Problem Relation Age of Onset   Breast cancer Mother    Heart disease Father        late 22's, stent   Diabetes Father    Cancer Maternal Grandmother        renal cancer   She indicated that her mother is alive. She indicated that her father is alive. She indicated that both of her brothers are alive. She indicated that the status of her maternal grandmother is unknown. She indicated that her son is alive.   Social History    Social History   Socioeconomic History   Marital status: Married    Spouse name: Not on file   Number of children: Not on file   Years of education: Not on file   Highest education level: Not on file  Occupational History   Not on file  Tobacco Use   Smoking status: Never   Smokeless tobacco: Never  Vaping Use   Vaping status: Never Used  Substance and Sexual Activity   Alcohol use: No    Comment: occasionally   Drug use: No   Sexual activity: Yes  Other Topics Concern   Not on file  Social History Narrative   She is married. Has 1 son (who is married, has 3 kids).  Total of  5 grandchildren between her and her husband   She walks with a cane sometimes (when back pain flaring). She is currently disabled (back pain, neuropathy, chronic pain).   Never smoked.   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      Updated 11/2022   Social Determinants of Health   Financial Resource Strain: Low Risk  (12/20/2022)   Overall Financial Resource Strain (CARDIA)    Difficulty of Paying  Living Expenses: Not hard at all  Food Insecurity: Low Risk  (02/15/2023)   Received from [facility]   Hunger Vital Sign    Worried About Running  Out of Food in the Last Year: Never true    Ran Out of Food in the Last Year: Never true  Transportation Needs: Not on file (02/15/2023)  Physical Activity: Not on file  Stress: No Stress Concern Present (12/20/2022)   [employer] of Occupational Health - Occupational Stress Questionnaire    Feeling of Stress : Not at all  Social Connections: Socially Integrated (12/20/2022)   Social Connection and Isolation Panel [NHANES]    Frequency of Communication with Friends and Family: More than three times a week    Frequency of Social Gatherings with Friends and Family: Not on file    Attends Religious Services: More than 4 times per year    Active Member of [employer] or Organizations: Yes    Attends Banker Meetings: More than 4 times per year    Marital Status: Married  Catering manager Violence: Not At Risk (12/20/2022)   Humiliation, Afraid, Rape, and Kick questionnaire    Fear of Current or Ex-Partner: No    Emotionally Abused: No    Physically Abused: No    Sexually Abused: No     Review of Systems    General:  No chills, fever, night sweats or weight changes.  Cardiovascular:  No chest pain, dyspnea on exertion, edema, orthopnea, palpitations, paroxysmal nocturnal dyspnea. Dermatological: No rash, lesions/masses Respiratory: No cough, dyspnea Urologic: No hematuria, dysuria Abdominal:   No nausea, vomiting, diarrhea, bright red blood per rectum, melena, or hematemesis Neurologic:  No visual changes, wkns, changes in mental status. All other systems reviewed and are otherwise negative except as noted above.  Physical Exam    VS:  BP (!) 140/88 (BP Location: Left Arm, Patient Position: Sitting, Cuff Size: Normal)   Pulse 61   Ht 5\' 6"  (1.676 m)   Wt 179 lb 12.8 oz (81.6 kg)   SpO2 98%   BMI 29.02 kg/m  , BMI Body mass index is 29.02 kg/m. GEN: Well nourished, well developed, in no acute distress. HEENT: normal. Neck: Supple, no JVD, carotid bruits, or  masses. Cardiac: RRR, no murmurs, rubs, or gallops. No clubbing, cyanosis, generalized right ankle edema.  Radials/DP/PT 2+ and equal bilaterally.  Respiratory:  Respirations regular and unlabored, clear to auscultation bilaterally. GI: Soft, nontender, nondistended, BS + x 4. MS: no deformity or atrophy. Skin: warm and dry, no rash. Neuro:  Strength and sensation are intact. Psych: Normal affect.  Accessory Clinical Findings    Recent Labs: 12/19/2022: BUN 15; Creatinine, Ser 0.77; Hemoglobin 13.7; Platelets 309; Potassium 3.5; Sodium 139 12/20/2022: ALT 13; TSH 1.130   Recent Lipid Panel    Component Value Date/Time   CHOL 161 12/20/2022 1450   TRIG 91 12/20/2022 1450   HDL 54 12/20/2022 1450   CHOLHDL 3.0 12/20/2022 1450   CHOLHDL 5.7 08/22/2011 0000   VLDL 34 08/22/2011 0000   LDLCALC 90 12/20/2022 1450    ECG personally reviewed by me today-EKG Interpretation Date/Time:  Thursday May 02 2023 13:58:54 EDT Ventricular Rate:  61 PR Interval:  172 QRS Duration:  76 QT Interval:  412 QTC Calculation: 414 R Axis:   65  Text Interpretation: Sinus rhythm with Premature atrial complexes When compared with ECG of 19-Dec-2022 16:48, Premature atrial complexes are now Present Confirmed by Edd Fabian [phone]) on 05/02/2023 2:13:03 PM  EKG 11/30/2021 normal sinus rhythm with sinus arrhythmia 73 bpm no ST or T wave deviation-no acute changes.  normal sinus rhythm nonspecific T wave abnormality 80 bpm- No acute changes  Echocardiogram 10/19/2014  Study Conclusions   - Left ventricle: The cavity size was normal. Wall thickness was    normal. Systolic function was normal. The estimated ejection    fraction was in the range of 50% to 55%. Wall motion was normal;    there were no regional wall motion abnormalities. Features are    consistent with a pseudonormal left ventricular filling pattern,    with concomitant abnormal relaxation and increased filling    pressure (grade 2  diastolic dysfunction).   Impressions:   - Low normal LV function; grade 2 diastolic dysfunction; calcified    right atrial mass; no change compared to 06/11/13.  Assessment & Plan   1. Diastolic CHF/right atrial thrombus-euvolemic.  Weight stable.  Maintaining baseline activity without increased work of breathing.   Continue furosemide, potassium, Heart healthy low-sodium diet-salty 6 reviewed Increase physical activity as tolerated   Palpitations-denies recent palpitations irregular heartbeats or accelerated heart rates.     Continue diltiazem Heart healthy low-sodium diet Increase physical activity as tolerated Again encouraged avoiding  triggers- caffeine, chocolate, EtOH, dehydration etc.  Essential hypertension-BP today 136/78. Maintain blood pressure log Continue diltiazem, lisinopril, furosemide, potassium   Hyperlipidemia- Goal less than 100. Continue red yeast rice, high-fiber diet Follows with PCP  Dyspnea on exertion-stable.  Appears to be multifactorial related to diastolic CHF, deconditioning, and body habitus Heart healthy low-sodium diet Increase physical activity as tolerated Continue weight loss  Disposition: Follow-up with Dr. Herbie Baltimore or me in 12 months.  Thomasene Ripple. Rasheedah Reis NP-C    05/02/2023, 2:13 PM [facility] [address] Office [phone] Fax [phone]  Notice: This dictation was prepared with Dragon dictation along with smaller phrase technology. Any transcriptional errors that result from this process are unintentional and may not be corrected upon review.  I spent 13 minutes examining this patient, reviewing medications, and using patient centered shared decision making involving her cardiac care.  Prior to her visit I spent greater than 20 minutes reviewing her past medical history,  medications, and prior cardiac tests.

## 2023-05-01 ENCOUNTER — Other Ambulatory Visit: Payer: Self-pay | Admitting: Cardiology

## 2023-05-01 ENCOUNTER — Telehealth: Payer: Self-pay | Admitting: Cardiology

## 2023-05-01 NOTE — Telephone Encounter (Signed)
*  STAT* If patient is at the pharmacy, call can be transferred to refill team.   1. Which medications need to be refilled? (please list name of each medication and dose if known) lisinopril (ZESTRIL) 5 MG tablet  2. Which pharmacy/location (including street and city if local pharmacy) is medication to be sent to? [id number] - [place], [place] - [address] AT [facility] OF [address]    3. Do they need a 30 day or 90 day supply?  90 day supply  Patient states she is completely out of medication and does not have any to take today.

## 2023-05-02 ENCOUNTER — Ambulatory Visit: Payer: Medicare Other | Attending: General Practice | Admitting: General Practice

## 2023-05-02 ENCOUNTER — Encounter: Payer: Self-pay | Admitting: General Practice

## 2023-05-02 VITALS — BP 136/78 | HR 61 | Ht 66.0 in | Wt 179.8 lb

## 2023-05-02 DIAGNOSIS — E785 Hyperlipidemia, unspecified: Secondary | ICD-10-CM

## 2023-05-02 DIAGNOSIS — R002 Palpitations: Secondary | ICD-10-CM

## 2023-05-02 DIAGNOSIS — I10 Essential (primary) hypertension: Secondary | ICD-10-CM

## 2023-05-02 DIAGNOSIS — I5030 Unspecified diastolic (congestive) heart failure: Secondary | ICD-10-CM

## 2023-05-02 DIAGNOSIS — R0602 Shortness of breath: Secondary | ICD-10-CM

## 2023-05-02 MED ORDER — LISINOPRIL 5 MG PO TABS: ORAL_TABLET | ORAL | 3 refills | Status: DC

## 2023-05-02 NOTE — Patient Instructions (Signed)
Medication Instructions:  The current medical regimen is effective;  continue present plan and medications as directed. Please refer to the Current Medication list given to you today.  *If you need a refill on your cardiac medications before your next appointment, please call your pharmacy*  Lab Work: NONE  Other Instructions PLEASE READ AND FOLLOW ATTACHED  SALTY 6  SEE ATTACHED SLEEP TIPS TAKE AND LOG YOUR BLOOD PRESSURE  Follow-Up: At [facility], you and your health needs are our priority.  As part of our continuing mission to provide you with exceptional heart care, we have created designated Provider Care Teams.  These Care Teams include your primary Cardiologist (physician) and Advanced Practice Providers (APPs -  Physician Assistants and Nurse Practitioners) who all work together to provide you with the care you need, when you need it.  Your next appointment:   12 month(s)  Provider:   Bryan Lemma, MD           Quality Sleep Information, Adult Quality sleep is important for your mental and physical health. It also improves your quality of life. Quality sleep means you: Are asleep for most of the time you are in bed. Fall asleep within 30 minutes. Wake up no more than once a night. Are awake for no longer than 20 minutes if you do wake up during the night. Most adults need 7-8 hours of quality sleep each night. How can poor sleep affect me? If you do not get enough quality sleep, you may have: Mood swings. Daytime sleepiness. Decreased alertness, reaction time, and concentration. Sleep disorders, such as insomnia and sleep apnea. Difficulty with: Solving problems. Coping with stress. Paying attention. These issues may affect your performance and productivity at work, school, and home. Lack of sleep may also put you at higher risk for accidents, suicide, and risky behaviors. If you do not get quality sleep, you may also be at higher risk for several  health problems, including: Infections. Type 2 diabetes. Heart disease. High blood pressure. Obesity. Worsening of long-term conditions, like arthritis, kidney disease, depression, Parkinson's disease, and epilepsy. What actions can I take to get more quality sleep? Sleep schedule and routine Stick to a sleep schedule. Go to sleep and wake up at about the same time each day. Do not try to sleep less on weekdays and make up for lost sleep on weekends. This does not work. Limit naps during the day to 30 minutes or less. Do not take naps in the late afternoon. Make time to relax before bed. Reading, listening to music, or taking a hot bath promotes quality sleep. Make your bedroom a place that promotes quality sleep. Keep your bedroom dark, quiet, and at a comfortable room temperature. Make sure your bed is comfortable. Avoid using electronic devices that give off bright blue light for 30 minutes before bedtime. Your brain perceives bright blue light as sunlight. This includes television, phones, and computers. If you are lying awake in bed for longer than 20 minutes, get up and do a relaxing activity until you feel sleepy. Lifestyle     Try to get at least 30 minutes of exercise on most days. Do not exercise 2-3 hours before going to bed. Do not use any products that contain nicotine or tobacco. These products include cigarettes, chewing tobacco, and vaping devices, such as e-cigarettes. If you need help quitting, ask your health care provider. Do not drink caffeinated beverages for at least 8 hours before going to bed. Coffee, tea,  and some sodas contain caffeine. Do not drink alcohol or eat large meals close to bedtime. Try to get at least 30 minutes of sunlight every day. Morning sunlight is best. Medical concerns Work with your health care provider to treat medical conditions that may affect sleeping, such as: Nasal obstruction. Snoring. Sleep apnea and other sleep disorders. Talk to  your health care provider if you think any of your prescription medicines may cause you to have difficulty falling or staying asleep. If you have sleep problems, talk with a sleep consultant. If you think you have a sleep disorder, talk with your health care provider about getting evaluated by a specialist. Where to find more information Sleep Foundation: sleepfoundation.org American Academy of Sleep Medicine: aasm.org Centers for Disease Control and Prevention (CDC): TonerPromos.no Contact a health care provider if: You have trouble getting to sleep or staying asleep. You often wake up very early in the morning and cannot get back to sleep. You have daytime sleepiness. You have daytime sleep attacks of suddenly falling asleep and sudden muscle weakness (narcolepsy). You have a tingling sensation in your legs with a strong urge to move your legs (restless legs syndrome). You stop breathing briefly during sleep (sleep apnea). You think you have a sleep disorder or are taking a medicine that is affecting your quality of sleep. Summary Most adults need 7-8 hours of quality sleep each night. Getting enough quality sleep is important for your mental and physical health. Make your bedroom a place that promotes quality sleep, and avoid things that may cause you to have poor sleep, such as alcohol, caffeine, smoking, or large meals. Talk to your health care provider if you have trouble falling asleep or staying asleep. This information is not intended to replace advice given to you by your health care provider. Make sure you discuss any questions you have with your health care provider. Document Revised: 11/08/2021 Document Reviewed: 11/08/2021 Elsevier Patient Education  2024 [employer].

## 2023-05-03 NOTE — Telephone Encounter (Signed)
Refill was sent in 05/02/23.

## 2023-05-03 NOTE — Progress Notes (Unsigned)
Olivia [name] [name] [address] [place] [address] Phone: [phone]   Assessment and Plan:     There are no diagnoses linked to this encounter.  ***   Pertinent previous records reviewed include ***   Follow Up: ***     Subjective:   I, Olivia [name], am serving as a Neurosurgeon for Doctor Olivia [name]   Chief Complaint: right hip pain    HPI:    06/12/2022 Patient is a 57 year old female complaining of right hip pain. Patient states that she has low back and top of hip pain, intermittent pain for about 2-3 weeks , no numbness and tingling, Ib for the pain and that doesn't seem to help , standing is painful, has been using a heat pad and that doesn't seem to help, radiates around to her low back, has a hard time sleeping   Patient reports that beginning on 10/17 she developed right-sided low back/flank pain.  The patient reports that this pain is waxed and waned over the last 3 weeks.  Patient reportedly goes to [facility] for back and neck pain and had an injection in her back on 10/31 which did improve the pain however the abdominal pain recurred.  The patient reports that the pain will get acutely worse out of nowhere without reason and then resolved.  Patient reports that she has been taking ibuprofen at home with slight relief of symptoms however symptoms have not been fully treated.  Patient reports history of kidney stones however states that this does not feel similar to those instances.  Patient denies any fevers, nausea, vomiting, diarrhea, dysuria, vaginal discharge, chest pain or shortness of bre   05/06/2023 Patient states   Relevant Historical Information: Hypertension, GERD, low back pain    Additional pertinent review of systems negative.   Current Outpatient Medications:    ascorbic acid (VITAMIN C) 250 MG tablet, Take 500 mg by mouth daily., Disp: , Rfl:    atorvastatin (LIPITOR) 20 MG tablet,  TAKE 1 TABLET(20 MG) BY MOUTH DAILY, Disp: 90 tablet, Rfl: 2   Biotin 1000 MCG tablet, Take 1,000 mcg by mouth daily., Disp: , Rfl:    Calcium Carbonate-Vitamin D (CALCIUM PLUS VITAMIN D PO), Take 2 capsules by mouth daily., Disp: , Rfl:    diltiazem (CARDIZEM CD) 180 MG 24 hr capsule, TAKE 1 CAPSULE(180 MG) BY MOUTH DAILY, Disp: 90 capsule, Rfl: 3   esomeprazole (NEXIUM) 20 MG capsule, Take 20 mg by mouth daily at 12 noon., Disp: , Rfl:    fluticasone (FLONASE) 50 MCG/ACT nasal spray, Place 2 sprays into both nostrils daily., Disp: 16 g, Rfl: 6   furosemide (LASIX) 40 MG tablet, TAKE 1 TABLET(40 MG) BY MOUTH DAILY, Disp: 30 tablet, Rfl: 9   gabapentin (NEURONTIN) 600 MG tablet, Take 600 mg by mouth at bedtime., Disp: , Rfl:    ibuprofen (ADVIL) 200 MG tablet, Take 600 mg by mouth every 6 (six) hours as needed., Disp: , Rfl:    lidocaine (LIDODERM) 5 %, Place 1 patch onto the skin daily as needed., Disp: 30 patch, Rfl: 0   lisinopril (ZESTRIL) 5 MG tablet, TAKE 1 TABLET(5 MG) BY MOUTH DAILY, Disp: 90 tablet, Rfl: 3   meclizine (ANTIVERT) 25 MG tablet, Take 1 tablet (25 mg total) by mouth 3 (three) times daily as needed for dizziness., Disp: 30 tablet, Rfl: 0   methocarbamol (ROBAXIN) 500 MG tablet, Take 500 mg by mouth as  needed for muscle spasms., Disp: , Rfl:    Multiple Vitamins-Minerals (MULTIVITAMIN WITH MINERALS) tablet, Take 1 tablet by mouth daily., Disp: , Rfl:    nabumetone (RELAFEN) 500 MG tablet, Take 500 mg by mouth 2 (two) times daily., Disp: , Rfl:    potassium chloride SA (KLOR-CON M) 20 MEQ tablet, TAKE 1 TABLET BY MOUTH EVERY OTHER DAY, Disp: 30 tablet, Rfl: 0   predniSONE (DELTASONE) 20 MG tablet, Take 2 tablets (40 mg total) by mouth daily., Disp: 10 tablet, Rfl: 0   scopolamine (TRANSDERM-SCOP) 1 MG/3DAYS, Place 1 patch (1.5 mg total) onto the skin every 3 (three) days., Disp: 4 patch, Rfl: 0   Turmeric (QC TUMERIC COMPLEX PO), Take by mouth daily., Disp: , Rfl:    vitamin E  400 UNIT capsule, Take 400 Units by mouth daily., Disp: , Rfl:    Objective:     There were no vitals filed for this visit.    There is no height or weight on file to calculate BMI.    Physical Exam:    ***   Electronically signed by:  Olivia [name] [name] 7:39 AM 05/03/23

## 2023-05-06 ENCOUNTER — Ambulatory Visit: Payer: Medicare Other | Admitting: Sports Medicine

## 2023-05-06 VITALS — BP 132/84 | HR 84 | Ht 66.0 in | Wt 179.0 lb

## 2023-05-06 DIAGNOSIS — M25561 Pain in right knee: Secondary | ICD-10-CM | POA: Diagnosis not present

## 2023-05-06 MED ORDER — MELOXICAM 15 MG PO TABS: 15.0000 mg | ORAL_TABLET | Freq: Every day | ORAL | 0 refills | Status: DC

## 2023-05-06 NOTE — Patient Instructions (Addendum)
Knee HEP  - Start meloxicam 15 mg daily x2 weeks.  If still having pain after 2 weeks, complete 3rd-week of meloxicam. May use remaining meloxicam as needed once daily for pain control.  Do not to use additional NSAIDs while taking meloxicam.  May use Tylenol [phone] mg 2 to 3 times a day for breakthrough pain. As needed follow up if no improvement 4 week follow up

## 2023-05-20 ENCOUNTER — Telehealth: Payer: Self-pay | Admitting: Cardiology

## 2023-05-20 ENCOUNTER — Other Ambulatory Visit: Payer: Self-pay | Admitting: Cardiology

## 2023-05-20 NOTE — Telephone Encounter (Signed)
*  STAT* If patient is at the pharmacy, call can be transferred to refill team.   1. Which medications need to be refilled? (please list name of each medication and dose if known)   furosemide (LASIX) 40 MG tablet    potassium chloride SA (KLOR-CON M) 20 MEQ tablet   2. Which pharmacy/location (including street and city if local pharmacy) is medication to be sent to?[id number] - [place], [place] - [address] AT [facility] OF [address]   3. Do they need a 30 day or 90 day supply? 90 day

## 2023-05-21 MED ORDER — FUROSEMIDE 40 MG PO TABS: 40.0000 mg | ORAL_TABLET | Freq: Every day | ORAL | 3 refills | Status: AC

## 2023-05-21 MED ORDER — POTASSIUM CHLORIDE CRYS ER 20 MEQ PO TBCR: 20.0000 meq | EXTENDED_RELEASE_TABLET | ORAL | 3 refills | Status: DC

## 2023-05-21 NOTE — Telephone Encounter (Signed)
Refills has been sent to the pharmacy. 

## 2023-05-30 ENCOUNTER — Ambulatory Visit
Admission: RE | Admit: 2023-05-30 | Discharge: 2023-05-30 | Disposition: A | Discharge status: Home / Self Care | Payer: Medicare Other | Source: Ambulatory Visit

## 2023-05-30 ENCOUNTER — Other Ambulatory Visit: Payer: Medicare Other

## 2023-05-30 ENCOUNTER — Ambulatory Visit
Admission: RE | Admit: 2023-05-30 | Discharge: 2023-05-30 | Disposition: A | Discharge status: Home / Self Care | Payer: Medicare Other | Source: Ambulatory Visit | Attending: Obstetrics and Gynecology | Admitting: Obstetrics and Gynecology

## 2023-05-30 DIAGNOSIS — N6320 Unspecified lump in the left breast, unspecified quadrant: Secondary | ICD-10-CM | POA: Diagnosis not present

## 2023-05-30 DIAGNOSIS — N63 Unspecified lump in unspecified breast: Secondary | ICD-10-CM

## 2023-05-30 DIAGNOSIS — Z853 Personal history of malignant neoplasm of breast: Secondary | ICD-10-CM | POA: Diagnosis not present

## 2023-07-04 ENCOUNTER — Other Ambulatory Visit: Payer: Self-pay | Admitting: Cardiology

## 2023-07-04 DIAGNOSIS — I5030 Unspecified diastolic (congestive) heart failure: Secondary | ICD-10-CM

## 2023-07-04 DIAGNOSIS — E785 Hyperlipidemia, unspecified: Secondary | ICD-10-CM

## 2023-07-04 DIAGNOSIS — I110 Hypertensive heart disease with heart failure: Secondary | ICD-10-CM

## 2023-07-04 DIAGNOSIS — R002 Palpitations: Secondary | ICD-10-CM

## 2023-07-04 DIAGNOSIS — R0602 Shortness of breath: Secondary | ICD-10-CM

## 2023-07-05 ENCOUNTER — Telehealth (INDEPENDENT_AMBULATORY_CARE_PROVIDER_SITE_OTHER): Payer: Medicare Other | Admitting: Medical

## 2023-07-05 VITALS — Wt 175.0 lb

## 2023-07-05 DIAGNOSIS — R051 Acute cough: Secondary | ICD-10-CM | POA: Diagnosis not present

## 2023-07-05 DIAGNOSIS — J3489 Other specified disorders of nose and nasal sinuses: Secondary | ICD-10-CM | POA: Diagnosis not present

## 2023-07-05 DIAGNOSIS — J988 Other specified respiratory disorders: Secondary | ICD-10-CM

## 2023-07-05 MED ORDER — BENZONATATE 200 MG PO CAPS: 200.0000 mg | ORAL_CAPSULE | Freq: Three times a day (TID) | ORAL | 0 refills | Status: DC | PRN

## 2023-07-05 MED ORDER — HYDROCODONE BIT-HOMATROP MBR 5-1.5 MG/5ML PO SOLN: 5.0000 mL | Freq: Three times a day (TID) | ORAL | 0 refills | Status: AC | PRN

## 2023-07-05 MED ORDER — AMOXICILLIN 875 MG PO TABS: 875.0000 mg | ORAL_TABLET | Freq: Two times a day (BID) | ORAL | 0 refills | Status: AC

## 2023-07-05 NOTE — Progress Notes (Signed)
Subjective:     Patient ID: Olivia [name], female   DOB: 19-Apr-1966, 57 y.o.   MRN: [id number]  This visit type was conducted due to national recommendations for restrictions regarding the COVID-19 Pandemic (e.g. social distancing) in an effort to limit this patient's exposure and mitigate transmission in our community.  Due to their co-morbid illnesses, this patient is at least at moderate risk for complications without adequate follow up.  This format is felt to be most appropriate for this patient at this time.    Documentation for virtual audio and video telecommunications through [facility] encounter:  The patient was located at home. The provider was located in the office. The patient did consent to this visit and is aware of possible charges through their insurance for this visit.  The other persons participating in this telemedicine service were none. Time spent on call was 20 minutes and in review of previous records 20 minutes total.  This virtual service is not related to other E/M service within previous 7 days.   HPI Chief Complaint  Patient presents with   URI    Cough, sneezing, sinus pressure, Headache, congestion, no fever, no covid test   Virtual for illness.    She reports cough, congestion, pain and pressure in face, nose congested and stopped up,  headache, sneezing and coughing.  symptoms x 5 days.  No fever, had some body aches that resolved.  Had chills.  Has some sore throat.  No ear pain.  No NVD.  No wheezing, but hard to breath through nose.  Getting up some yellow phlegm with cough.  More colored mucous now.   No bloody mucous.  Getting clear to yellow drainage with blowing nose.   Using theralfu and mucinex.  No sick contacts.  No recent covid test.  Despite over-the-counter remedies and time is not improving and getting worse.  No other aggravating or relieving factors. No other complaint.   Past Medical History:  Diagnosis Date   Breast cancer, stage 1  (HCC)    Colon polyp    GERD (gastroesophageal reflux disease)    H/O diastolic dysfunction 05/2013   Grade 2 DD. 1.3 x 1.3 cm mass in RA on Echo -->Cardiac MRI 12/04: 1.9 x 1.6 cm mass in the lateral RA cavity. -- Most suggestive of thrombus--  calcified and  lamina and ted suggestive of chronic.; EF from 50% with no scar.   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   Uterine fibroid    s/p embolization   Current Outpatient Medications on File Prior to Visit  Medication Sig Dispense Refill   ascorbic acid (VITAMIN C) 250 MG tablet Take 500 mg by mouth daily.     atorvastatin (LIPITOR) 20 MG tablet TAKE 1 TABLET(20 MG) BY MOUTH DAILY 90 tablet 2   Biotin 1000 MCG tablet Take 1,000 mcg by mouth daily.     Calcium Carbonate-Vitamin D (CALCIUM PLUS VITAMIN D PO) Take 2 capsules by mouth daily.     esomeprazole (NEXIUM) 20 MG capsule Take 20 mg by mouth daily at 12 noon.  fluticasone (FLONASE) 50 MCG/ACT nasal spray Place 2 sprays into both nostrils daily. 16 g 6   furosemide (LASIX) 40 MG tablet Take 1 tablet (40 mg total) by mouth daily. 90 tablet 3   gabapentin (NEURONTIN) 600 MG tablet Take 600 mg by mouth at bedtime.     ibuprofen (ADVIL) 200 MG tablet Take 600 mg by mouth every 6 (six) hours as needed.     lidocaine (LIDODERM) 5 % Place 1 patch onto the skin daily as needed. 30 patch 0   lisinopril (ZESTRIL) 5 MG tablet TAKE 1 TABLET(5 MG) BY MOUTH DAILY 90 tablet 3   meloxicam (MOBIC) 15 MG tablet Take 1 tablet (15 mg total) by mouth daily. 30 tablet 0   methocarbamol (ROBAXIN) 500 MG tablet Take 500 mg by mouth as needed for  muscle spasms.     Multiple Vitamins-Minerals (MULTIVITAMIN WITH MINERALS) tablet Take 1 tablet by mouth daily.     nabumetone (RELAFEN) 500 MG tablet Take 500 mg by mouth 2 (two) times daily.     potassium chloride SA (KLOR-CON M) 20 MEQ tablet Take 1 tablet (20 mEq total) by mouth every other day. 45 tablet 3   Turmeric (QC TUMERIC COMPLEX PO) Take by mouth daily.     vitamin E 400 UNIT capsule Take 400 Units by mouth daily.     diltiazem (CARDIZEM CD) 180 MG 24 hr capsule TAKE 1 CAPSULE(180 MG) BY MOUTH DAILY 90 capsule 3   No current facility-administered medications on file prior to visit.    Review of Systems As in subjective    Objective:   Physical Exam Due to coronavirus pandemic stay at home measures, patient visit was virtual and they were not examined in person.   Wt 175 lb (79.4 kg)   BMI 28.25 kg/m   Gen: wd, wn, nad No labored breathing or wheezing Answers questions appropriately She sounds stopped up and congested      Assessment:     Encounter Diagnoses  Name Primary?   Acute cough Yes   Respiratory tract infection    Sinus pressure        Plan:     Advised rest, good water intake, continue nasal saline flush.  Advise she use Mucinex plain or Mucinex DM instead of Mucinex D so it does not interfere with blood pressure.  Begin amoxicillin as below  Since this is Friday afternoon and I prescribed 2 different cough medicines in the event one of them is not covered by insurance or too expensive.  I have asked her to call the pharmacy just to verify before we closed today in case any do something different with the prescriptions  If not much improved in the next 3 to 5 days or if worse get reevaluated   Abcde was seen today for uri.  Diagnoses and all orders for this visit:  Acute cough  Respiratory tract infection  Sinus pressure  Other orders -     amoxicillin (AMOXIL) 875 MG tablet; Take 1 tablet (875 mg total) by mouth 2 (two) times daily  for 10 days. -     benzonatate (TESSALON) 200 MG capsule; Take 1 capsule (200 mg total) by mouth 3 (three) times daily as needed for cough. -     HYDROcodone bit-homatropine (HYCODAN) 5-1.5 MG/5ML syrup; Take 5 mLs by mouth every 8 (eight) hours as needed for up to 5 days for cough.    F/u prn

## 2023-10-25 ENCOUNTER — Other Ambulatory Visit (HOSPITAL_COMMUNITY)
Admission: RE | Admit: 2023-10-25 | Discharge: 2023-10-25 | Disposition: A | Discharge status: Home / Self Care | Source: Ambulatory Visit | Attending: Internal Medicine | Admitting: Internal Medicine

## 2023-10-25 ENCOUNTER — Ambulatory Visit (INDEPENDENT_AMBULATORY_CARE_PROVIDER_SITE_OTHER): Admitting: Internal Medicine

## 2023-10-25 ENCOUNTER — Other Ambulatory Visit: Payer: Self-pay

## 2023-10-25 ENCOUNTER — Encounter: Payer: Self-pay | Admitting: Internal Medicine

## 2023-10-25 ENCOUNTER — Ambulatory Visit: Payer: Self-pay

## 2023-10-25 VITALS — BP 124/72 | HR 90 | Temp 98.0°F | Resp 18 | Ht 64.0 in | Wt 180.0 lb

## 2023-10-25 DIAGNOSIS — R350 Frequency of micturition: Secondary | ICD-10-CM | POA: Diagnosis not present

## 2023-10-25 DIAGNOSIS — B379 Candidiasis, unspecified: Secondary | ICD-10-CM | POA: Diagnosis not present

## 2023-10-25 DIAGNOSIS — N3090 Cystitis, unspecified without hematuria: Secondary | ICD-10-CM

## 2023-10-25 LAB — POCT URINALYSIS DIPSTICK
Bilirubin, UA: NEGATIVE
Glucose, UA: NEGATIVE
Ketones, UA: NEGATIVE
Nitrite, UA: NEGATIVE
Protein, UA: POSITIVE — AB
Spec Grav, UA: 1.02 (ref 1.010–1.025)
Urobilinogen, UA: 0.2 U/dL
pH, UA: 5.5 (ref 5.0–8.0)

## 2023-10-25 MED ORDER — SULFAMETHOXAZOLE-TRIMETHOPRIM 800-160 MG PO TABS
1.0000 | ORAL_TABLET | Freq: Two times a day (BID) | ORAL | 0 refills | Status: AC
Start: 2023-10-25 — End: 2023-10-28

## 2023-10-25 MED ORDER — FLUCONAZOLE 150 MG PO TABS
150.0000 mg | ORAL_TABLET | Freq: Once | ORAL | 0 refills | Status: AC
Start: 2023-10-25 — End: 2023-10-25

## 2023-10-25 NOTE — Telephone Encounter (Signed)
  Chief Complaint: pelvic pressure/urinary frequency Symptoms: right sided pelvis/groin pressure, urinary frequency, fatigue, lower back pain Frequency: x 1 week Pertinent Negatives: Patient denies incontinence, burning with urination, blood in urine, fever Disposition: [] ED /[] Urgent Care (no appt availability in office) / [x] Appointment(In office/virtual)/ []  [facility]/ [] Home Care/ [] Refused Recommended Disposition /[] [facility]/ []  Follow-up with PCP Additional Notes: Patient complains of urinary frequency, pelvic/groin pressure and lower back pain that she states she is concerned for a UTI. Patient agreeable to acute visit with HCP today. No availability with PCP/clinic today, patient agreeable to acute visit with available provider at [facility].  Copied from CRM [id number]. Topic: Clinical - Red Word Triage >> Oct 25, 2023  1:50 PM Alessandra Bevels wrote: Red Word that prompted transfer to Nurse Triage: Patient is calling to report right side pain and groin pressure & pain for one week with pain 7-8/10. Reason for Disposition  Side (flank) or lower back pain present  Answer Assessment - Initial Assessment Questions 1. SYMPTOM: "What's the main symptom you're concerned about?" (e.g., frequency, incontinence)     Urinary frequency  2. ONSET: "When did the  symptoms  start?"     X 1 week.  3. PAIN: "Is there any pain?" If Yes, ask: "How bad is it?" (Scale: 1-10; mild, moderate, severe)     7-8/10 intermittent right pelvic/groin.  4. CAUSE: "What do you think is causing the symptoms?"     Patient states it feels like a UTI.   5. OTHER SYMPTOMS: "Do you have any other symptoms?" (e.g., blood in urine, fever, flank pain, pain with urination)     Lower back pain.  6. PREGNANCY: "Is there any chance you are pregnant?" "When was your last menstrual period?"     N/A.  Protocols used: Urinary Symptoms-A-AH

## 2023-10-25 NOTE — Progress Notes (Signed)
   Acute Office Visit  Subjective:     Patient ID: Olivia [name], female    DOB: 02/22/1966, 58 y.o.   MRN: [id number]  Chief Complaint  Patient presents with   Urinary Frequency    Groin Pain and pressure right side, fatigue for 1 week     HPI Patient is in today for urinary symptoms. This is my first time meeting her.   URINARY SYMPTOMS Dysuria: no Urinary frequency: yes Urgency: yes Hematuria: no Abdominal pain: no Back pain: no Suprapubic pain/pressure: yes Flank pain: yes Fever:  no Vomiting: no History of previous UTI years ago, history of left sided kidney stone that she was able to pass on her own.     Review of Systems  Constitutional:  Negative for chills and fever.  Genitourinary:  Positive for flank pain, frequency and urgency. Negative for dysuria and hematuria.        Objective:    BP 124/72 (Cuff Size: Large)   Pulse 90   Temp 98 F (36.7 C) (Oral)   Resp 18   Ht 5\' 4"  (1.626 m)   Wt 180 lb (81.6 kg)   BMI 30.90 kg/m    Physical Exam Constitutional:      Appearance: Normal appearance.  HENT:     Head: Normocephalic and atraumatic.  Eyes:     Conjunctiva/sclera: Conjunctivae normal.  Cardiovascular:     Rate and Rhythm: Normal rate and regular rhythm.  Pulmonary:     Effort: Pulmonary effort is normal.     Breath sounds: Normal breath sounds.  Abdominal:     Tenderness: There is no right CVA tenderness or left CVA tenderness.  Skin:    General: Skin is warm and dry.  Neurological:     General: No focal deficit present.     Mental Status: She is alert. Mental status is at baseline.  Psychiatric:        Mood and Affect: Mood normal.        Behavior: Behavior normal.     No results found for any visits on 10/25/23.      Assessment & Plan:   1. Cystitis/Urinary frequency (Primary): UA with 3+ leukocytes, trace blood and negative nitrates. Due to symptoms will treat with antibiotics, will send urine sample for culture.  Discussed over the counter Azo and increasing oral hydration. No CVA tenderness on exam, will treat as an infection and if she still has flank pain recommend renal CT.   - POCT urinalysis dipstick - Urine Culture - sulfamethoxazole-trimethoprim (BACTRIM DS) 800-160 MG tablet; Take 1 tablet by mouth 2 (two) times daily for 3 days.  Dispense: 6 tablet; Refill: 0  2. Yeast infection: Prone to yeast infections with antibiotics, will go ahead and send Diflucan to pharmacy. Vaginal swab pending.   - fluconazole (DIFLUCAN) 150 MG tablet; Take 1 tablet (150 mg total) by mouth once for 1 dose.  Dispense: 3 tablet; Refill: 0 - Cervicovaginal ancillary only   Return if symptoms worsen or fail to improve.  Margarita Mail, DO

## 2023-10-26 LAB — URINE CULTURE
MICRO NUMBER:: 16262680
Result:: NO GROWTH
SPECIMEN QUALITY:: ADEQUATE

## 2023-10-28 ENCOUNTER — Ambulatory Visit: Payer: Self-pay

## 2023-10-28 NOTE — Telephone Encounter (Signed)
   Chief Complaint: right groin pain  Symptoms: pain   Disposition: [] ED /[] Urgent Care (no appt availability in office) / [x] Appointment(In office/virtual)/ []  [facility]/ [] Home Care/ [] Refused Recommended Disposition /[] [facility]/ []  Follow-up with PCP Additional Notes: Pt calling back with same right groin pain that can radiate into thigh. Pt states pain is 8/10. Pt is currently lying down resting and some relief now.  Pt was seen on 3/28 with Cystitis. Pt stated the symptoms are the same and pt doesn't feel better. Pt denies fever. Pt has appt tomorrow at 1515. RN gave care advice and pt verbalized understanding.              Copied from CRM [id number]. Topic: Clinical - Red Word Triage >> Oct 28, 2023  3:55 PM Nyra Capes wrote: Red Word that prompted transfer to Nurse Triage: Patient first called on 10/25/2022 Patient calling back in saying, she took the medication and not feeling any better. 8 pain scale. Reason for Disposition  [1] MODERATE (e.g., interferes with normal activities) pelvic pain AND [2] pain comes and goes (cramps) AND [3] present > 24 hours  Answer Assessment - Initial Assessment Questions 1. LOCATION: "Where does it hurt?"      Right  2. RADIATION: "Does the pain shoot anywhere else?" (e.g., lower back, groin, thighs)     Groin into thigh    5. PATTERN "Does the pain come and go, or is it constant?"    - If constant: "Is it getting better, staying the same, or worsening?"      (Note: Constant means the pain never goes away completely; most serious pain is constant and gets worse over time)     - If intermittent: "How long does it last?" "Do you have pain now?"     (Note: Intermittent means the pain goes away completely between bouts)     Comes and goes  6. SEVERITY: "How bad is the pain?"  (e.g., Scale 1-10; mild, moderate, or severe)   - MILD (1-3): doesn't interfere with normal activities, area soft and not tender to touch    -  MODERATE (4-7): interferes with normal activities or awakens from sleep, abdomen tender to touch    - SEVERE (8-10): excruciating pain, doubled over, unable to do any normal activities      8-9 7. RECURRENT SYMPTOM: "Have you ever had this type of pelvic pain before?" If Yes, ask: "When was the last time?" and "What happened that time?"      Denies  8. CAUSE: "What do you think is causing the pelvic pain?"     Unsure  9. RELIEVING/AGGRAVATING FACTORS: "What makes it better or worse?" (e.g., activity/rest, sexual intercourse, voiding, passing stool)     Lying down helps 10. OTHER SYMPTOMS: "Has there been any other symptoms?" (e.g., fever, constipation, diarrhea, urine problems, vaginal bleeding, vaginal discharge, or vomiting?"       Denies all  Protocols used: Pelvic Pain - Female-A-AH

## 2023-10-29 ENCOUNTER — Ambulatory Visit (INDEPENDENT_AMBULATORY_CARE_PROVIDER_SITE_OTHER): Admitting: Medical

## 2023-10-29 ENCOUNTER — Encounter: Payer: Self-pay | Admitting: Internal Medicine

## 2023-10-29 VITALS — BP 120/80 | HR 80 | Wt 179.0 lb

## 2023-10-29 DIAGNOSIS — R1031 Right lower quadrant pain: Secondary | ICD-10-CM | POA: Diagnosis not present

## 2023-10-29 DIAGNOSIS — R109 Unspecified abdominal pain: Secondary | ICD-10-CM | POA: Diagnosis not present

## 2023-10-29 LAB — CERVICOVAGINAL ANCILLARY ONLY
Bacterial Vaginitis (gardnerella): NEGATIVE
Chlamydia: NEGATIVE
Comment: NEGATIVE
Comment: NEGATIVE
Comment: NEGATIVE
Comment: NORMAL
Neisseria Gonorrhea: NEGATIVE
Trichomonas: NEGATIVE

## 2023-10-29 LAB — POCT URINALYSIS DIP (PROADVANTAGE DEVICE)
Bilirubin, UA: NEGATIVE
Blood, UA: NEGATIVE
Glucose, UA: NEGATIVE mg/dL
Ketones, POC UA: NEGATIVE mg/dL
Nitrite, UA: NEGATIVE
Protein Ur, POC: NEGATIVE mg/dL
Specific Gravity, Urine: 1.02
Urobilinogen, Ur: NEGATIVE
pH, UA: 6 (ref 5.0–8.0)

## 2023-10-29 NOTE — Progress Notes (Signed)
 Subjective:  Olivia [name] is a 58 y.o. female who presents for Chief Complaint  Patient presents with   right groin pain    Started out with right back pain then went to side and into groin/ pelvic pain. Went to [facility] on Friday and thought it was a UTI they gave her antibiotics for 3 days but still having the pain     Here for pains in right low back and groin.  Here with husband today.  Went to urgent care in [place] 5 days ago for the symptoms, had microscopic blood in urine, was started on bactrim x 3 days which she finished  Still having the same, pain.  No pressure with urination, no pain with urination, no significant frequency or urgency.  No vaginal discharge.  No pain with intercourse.  Pain is mainly in the right lower abdomen that wraps around some to her back but she thinks it is related to her bladder  Exercise some some walking, no deep bends or heavy lifting.  Not working currently.   No recent yard work.  No other aggravating or relieving factors.    No other c/o.  Past Medical History:  Diagnosis Date   Breast cancer, stage 1 (HCC)    Colon polyp    GERD (gastroesophageal reflux disease)    H/O diastolic dysfunction 05/2013   Grade 2 DD. 1.3 x 1.3 cm mass in RA on Echo -->Cardiac MRI 12/04: 1.9 x 1.6 cm mass in the lateral RA cavity. -- Most suggestive of thrombus--  calcified and  lamina and ted suggestive of chronic.; EF from 50% with no scar.   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   Uterine fibroid    s/p embolization   Current Outpatient Medications on File Prior to Visit  Medication Sig  Dispense Refill   atorvastatin (LIPITOR) 20 MG tablet TAKE 1 TABLET(20 MG) BY MOUTH DAILY 90 tablet 2   Biotin 1000 MCG tablet Take 1,000 mcg by mouth daily.     Calcium Carbonate-Vitamin D (CALCIUM PLUS VITAMIN D PO) Take 2 capsules by mouth daily.     diltiazem (CARDIZEM CD) 180 MG 24 hr capsule TAKE 1 CAPSULE(180 MG) BY MOUTH DAILY 90 capsule 3   esomeprazole (NEXIUM) 20 MG capsule Take 20 mg by mouth daily at 12 noon.     fluticasone (FLONASE) 50 MCG/ACT nasal spray Place 2 sprays into both nostrils daily. 16 g 6   furosemide (LASIX) 40 MG tablet Take 1 tablet (40 mg total) by mouth daily. 90 tablet 3   gabapentin (NEURONTIN) 600 MG tablet Take 600 mg by mouth at bedtime.     ibuprofen (ADVIL) 200 MG tablet Take 600 mg by mouth every 6 (six) hours as needed.     lidocaine (LIDODERM) 5 % Place 1 patch onto the skin daily as needed. 30 patch 0   lisinopril (ZESTRIL) 5 MG tablet TAKE 1 TABLET(5 MG) BY MOUTH DAILY 90 tablet 3   methocarbamol (ROBAXIN) 500 MG tablet Take 500 mg by mouth as needed for muscle spasms.     Multiple Vitamins-Minerals (MULTIVITAMIN  WITH MINERALS) tablet Take 1 tablet by mouth daily.     nabumetone (RELAFEN) 500 MG tablet Take 500 mg by mouth 2 (two) times daily.     potassium chloride SA (KLOR-CON M) 20 MEQ tablet Take 1 tablet (20 mEq total) by mouth every other day. 45 tablet 3   Turmeric (QC TUMERIC COMPLEX PO) Take by mouth daily.     vitamin E 400 UNIT capsule Take 400 Units by mouth daily.     ascorbic acid (VITAMIN C) 250 MG tablet Take 500 mg by mouth daily.     No current facility-administered medications on file prior to visit.     The following portions of the patient's history were reviewed and updated as appropriate: allergies, current medications, past family history, past medical history, past social history, past surgical history and problem list.  Review of Systems  Constitutional:  Negative for chills and fever.  Gastrointestinal:  Positive for  abdominal pain. Negative for diarrhea, nausea and vomiting.  Genitourinary:  Positive for flank pain and urgency. Negative for dysuria, frequency and hematuria.  Musculoskeletal:  Positive for back pain.     Objective: BP 120/80   Pulse 80   Wt 179 lb (81.2 kg)   BMI 30.73 kg/m   Wt Readings from Last 3 Encounters:  10/29/23 179 lb (81.2 kg)  10/25/23 180 lb (81.6 kg)  07/05/23 175 lb (79.4 kg)    General appearance: alert, no distress, well developed, well nourished Abdomen: +bs, soft, +RLQ tendnerss, no rebound, no obvious hernia, non tender, non distended, no masses, no hepatomegaly, no splenomegaly Pulses: 2+ radial pulses, 2+ pedal pulses, normal cap refill Ext: no edema Back: nontender Legs nontender, no pain with ROM or resisted motion, normal hip ROM Gyn - declined   Assessment: Encounter Diagnoses  Name Primary?   Abdominal pain, RLQ Yes   Flank pain      Plan: We discussed her symptoms and concerns.  Symptoms and urine findings do not necessarily suggest urinary tract infection and her urine culture results from 10/25/2023 was negative for infection.  Gonorrhea and Chlamydia test was also negative.  We discussed other possibilities.  On exam of the abdomen there is no obvious hernia she seems to be tender more closer to the adnexal area but still in the region of the appendix  We will set her up for pelvic ultrasound and labs as below  If any new or worse symptoms in the meantime call back  [name] was seen today for right groin pain.  Diagnoses and all orders for this visit:  Abdominal pain, RLQ -     Basic metabolic panel with GFR -     CBC with Differential/Platelet -     POCT Urinalysis DIP (Proadvantage Device) -     US PELVIC COMPLETE WITH TRANSVAGINAL; Future  Flank pain -     Basic metabolic panel with GFR -     CBC with Differential/Platelet -     POCT Urinalysis DIP (Proadvantage Device) -     US PELVIC COMPLETE WITH TRANSVAGINAL;  Future    Follow up: Pending labs and ultrasound

## 2023-10-30 LAB — BASIC METABOLIC PANEL WITH GFR
BUN/Creatinine Ratio: 13 (ref 9–23)
BUN: 14 mg/dL (ref 6–24)
CO2: 24 mmol/L (ref 20–29)
Calcium: 9.6 mg/dL (ref 8.7–10.2)
Chloride: 102 mmol/L (ref 96–106)
Creatinine, Ser: 1.1 mg/dL — ABNORMAL HIGH (ref 0.57–1.00)
Glucose: 82 mg/dL (ref 70–99)
Potassium: 3.9 mmol/L (ref 3.5–5.2)
Sodium: 142 mmol/L (ref 134–144)
eGFR: 59 mL/min/{1.73_m2} — ABNORMAL LOW (ref >59)

## 2023-10-30 LAB — CBC WITH DIFFERENTIAL/PLATELET
Basophils Absolute: 0 10*3/uL (ref 0.0–0.2)
Basos: 1 %
EOS (ABSOLUTE): 0.1 10*3/uL (ref 0.0–0.4)
Eos: 2 %
Hematocrit: 39.2 % (ref 34.0–46.6)
Hemoglobin: 13.7 g/dL (ref 11.1–15.9)
Immature Grans (Abs): 0 10*3/uL (ref 0.0–0.1)
Immature Granulocytes: 0 %
Lymphocytes Absolute: 2.2 10*3/uL (ref 0.7–3.1)
Lymphs: 38 %
MCH: 31.4 pg (ref 26.6–33.0)
MCHC: 34.9 g/dL (ref 31.5–35.7)
MCV: 90 fL (ref 79–97)
Monocytes Absolute: 0.4 10*3/uL (ref 0.1–0.9)
Monocytes: 6 %
Neutrophils Absolute: 3.1 10*3/uL (ref 1.4–7.0)
Neutrophils: 53 %
Platelets: 272 10*3/uL (ref 150–450)
RBC: 4.37 x10E6/uL (ref 3.77–5.28)
RDW: 12.3 % (ref 11.7–15.4)
WBC: 5.8 10*3/uL (ref 3.4–10.8)

## 2023-10-30 NOTE — Progress Notes (Signed)
 Results sent through MyChart

## 2023-11-01 ENCOUNTER — Ambulatory Visit
Admission: RE | Admit: 2023-11-01 | Discharge: 2023-11-01 | Disposition: A | Discharge status: Home / Self Care | Source: Ambulatory Visit | Attending: Medical | Admitting: Medical

## 2023-11-01 DIAGNOSIS — D259 Leiomyoma of uterus, unspecified: Secondary | ICD-10-CM | POA: Diagnosis not present

## 2023-11-01 DIAGNOSIS — R1031 Right lower quadrant pain: Secondary | ICD-10-CM | POA: Diagnosis not present

## 2023-11-01 DIAGNOSIS — R109 Unspecified abdominal pain: Secondary | ICD-10-CM

## 2023-11-01 NOTE — Progress Notes (Signed)
 Results sent through MyChart

## 2023-11-13 DIAGNOSIS — R5383 Other fatigue: Secondary | ICD-10-CM | POA: Diagnosis not present

## 2023-11-13 DIAGNOSIS — Z6829 Body mass index (BMI) 29.0-29.9, adult: Secondary | ICD-10-CM | POA: Diagnosis not present

## 2023-11-27 ENCOUNTER — Other Ambulatory Visit: Payer: Self-pay | Admitting: Cardiology

## 2023-11-27 ENCOUNTER — Telehealth: Payer: Self-pay | Admitting: Cardiology

## 2023-11-27 DIAGNOSIS — E785 Hyperlipidemia, unspecified: Secondary | ICD-10-CM

## 2023-11-27 DIAGNOSIS — R0602 Shortness of breath: Secondary | ICD-10-CM

## 2023-11-27 DIAGNOSIS — I5032 Chronic diastolic (congestive) heart failure: Secondary | ICD-10-CM

## 2023-11-27 DIAGNOSIS — R002 Palpitations: Secondary | ICD-10-CM

## 2023-11-27 DIAGNOSIS — I5030 Unspecified diastolic (congestive) heart failure: Secondary | ICD-10-CM

## 2023-11-27 DIAGNOSIS — I110 Hypertensive heart disease with heart failure: Secondary | ICD-10-CM

## 2023-11-27 NOTE — Telephone Encounter (Signed)
*  STAT* If patient is at the pharmacy, call can be transferred to refill team.   1. Which medications need to be refilled? (please list name of each medication and dose if known)   diltiazem  (CARDIZEM  CD) 180 MG 24 hr capsule   2. Would you like to learn more about the convenience, safety, & potential cost savings by using the [facility]?   3. Are you open to using the [facility] (Type [facility]. ).  4. Which pharmacy/location (including street and city if local pharmacy) is medication to be sent to?  [id number] - [place], [place] - [address] AT [facility] OF [address]   5. Do they need a 30 day or 90 day supply?   90 day  Patient stated she is completely out of this medication.

## 2023-11-28 MED ORDER — DILTIAZEM HCL ER COATED BEADS 180 MG PO CP24
180.0000 mg | ORAL_CAPSULE | Freq: Every day | ORAL | 1 refills | Status: DC
Start: 2023-11-28 — End: 2024-04-01

## 2023-11-28 NOTE — Telephone Encounter (Signed)
 Pt's medication was sent to pt's pharmacy as requested. Confirmation received.

## 2023-11-30 ENCOUNTER — Other Ambulatory Visit: Payer: Self-pay | Admitting: Cardiology

## 2023-11-30 DIAGNOSIS — I5032 Chronic diastolic (congestive) heart failure: Secondary | ICD-10-CM

## 2023-11-30 DIAGNOSIS — I5030 Unspecified diastolic (congestive) heart failure: Secondary | ICD-10-CM

## 2023-11-30 DIAGNOSIS — R0602 Shortness of breath: Secondary | ICD-10-CM

## 2023-11-30 DIAGNOSIS — E785 Hyperlipidemia, unspecified: Secondary | ICD-10-CM

## 2023-11-30 DIAGNOSIS — R002 Palpitations: Secondary | ICD-10-CM

## 2023-12-03 ENCOUNTER — Telehealth: Payer: Self-pay | Admitting: *Deleted

## 2023-12-03 NOTE — Telephone Encounter (Signed)
 I called patient again to inquire what she is needing. I too have no idea about what she needs. Aasked her to please call back or send a MyChart.

## 2023-12-03 NOTE — Telephone Encounter (Signed)
 [facility] called the office and would like you to call Shianne back. I do not see anything about a CT scan so I could not help them.

## 2023-12-03 NOTE — Telephone Encounter (Signed)
 Copied from CRM [id number]. Topic: General - Other >> Dec 03, 2023 12:49 PM Marissa P wrote: Reason for CRM: Patient called please if someone can follow up with her in regards to scheduling ct scan. Callback# [phone]  Called patient and left message asking her to call back for assistance w/CT.

## 2023-12-04 ENCOUNTER — Telehealth: Payer: Self-pay | Admitting: *Deleted

## 2023-12-04 NOTE — Telephone Encounter (Signed)
 Patient has been playing phone tag through [facility]. She stopped by. She saw you after seeing UC for RLQ pain 10/29/23. U/S was done and she states that she was told CT was the next step. I told her she might need to schedule appt since it has been 5 weeks-please advise.

## 2023-12-05 NOTE — Telephone Encounter (Signed)
 Called patient and scheduled her for Tues afternoon.

## 2023-12-09 NOTE — Progress Notes (Unsigned)
 No chief complaint on file.  10/25/23 she was seen with flank pain, urgency and frequency (UC). UA with 3+ leukocytes, trace blood and negative nitrates. Due to symptoms, was treated presumptively with Bactrim  DS x 3 days. She had no CVA tenderness on exam.  Renal CT was recommended if she had persistent flank pain. Urine culture was negative. Negative tests for GC/chlamydia, trich.  She saw Jimmye Moulds on 4/1 for follow-up.  He reported her complaints to be pain at R lower abdomen that wrapped around some to her back. At that time reported no pressure with urination, no pain with urination, no significant frequency or urgency.  No vaginal discharge.  No pain with intercourse.   He sent her for pelvic US  11/01/23: IMPRESSION: 1. No acute abnormality identified. 2. Calcified uterine fibroids. 3. Bilateral ovaries not visualized.   She presents today due to having ongoing pain. She does have h/o kidney stone in the past.   PMH, PSH, SH reviewed   ROS:    PHYSICAL EXAM:  There were no vitals taken for this visit.  Wt Readings from Last 3 Encounters:  10/29/23 179 lb (81.2 kg)  10/25/23 180 lb (81.6 kg)  07/05/23 175 lb (79.4 kg)       ASSESSMENT/PLAN:   U/a

## 2023-12-10 ENCOUNTER — Encounter: Payer: Self-pay | Admitting: Family Medicine

## 2023-12-10 ENCOUNTER — Encounter: Payer: Self-pay | Admitting: *Deleted

## 2023-12-10 ENCOUNTER — Ambulatory Visit: Admitting: Family Medicine

## 2023-12-10 VITALS — BP 130/80 | HR 80 | Temp 98.9°F | Ht 64.0 in | Wt 179.2 lb

## 2023-12-10 DIAGNOSIS — R1031 Right lower quadrant pain: Secondary | ICD-10-CM | POA: Diagnosis not present

## 2023-12-10 LAB — POCT URINALYSIS DIP (PROADVANTAGE DEVICE)
Bilirubin, UA: NEGATIVE
Blood, UA: NEGATIVE
Glucose, UA: NEGATIVE mg/dL
Ketones, POC UA: NEGATIVE mg/dL
Nitrite, UA: NEGATIVE
Protein Ur, POC: NEGATIVE mg/dL
Specific Gravity, Urine: 1.01
Urobilinogen, Ur: 0.2
pH, UA: 7 (ref 5.0–8.0)

## 2023-12-10 NOTE — Patient Instructions (Signed)
 We are going to check a CT scan to further evaluate the cause of the right lower stomach pain. This will evaluate your internal organs (appendix, ovary, bladder).  Since the heating pad helps, it is possible that there is a musculoskeletal component. Try some stretches as shown, and continue the heat and anti-inflammatories.  Someone should be contacting you within the next day with your appointment.

## 2023-12-11 ENCOUNTER — Ambulatory Visit (HOSPITAL_COMMUNITY)
Admission: RE | Admit: 2023-12-11 | Discharge: 2023-12-11 | Disposition: A | Discharge status: Home / Self Care | Source: Ambulatory Visit | Attending: Family Medicine | Admitting: Family Medicine

## 2023-12-11 ENCOUNTER — Ambulatory Visit: Payer: Self-pay | Admitting: Family Medicine

## 2023-12-11 DIAGNOSIS — K429 Umbilical hernia without obstruction or gangrene: Secondary | ICD-10-CM | POA: Diagnosis not present

## 2023-12-11 DIAGNOSIS — R1031 Right lower quadrant pain: Secondary | ICD-10-CM | POA: Diagnosis not present

## 2023-12-11 DIAGNOSIS — D259 Leiomyoma of uterus, unspecified: Secondary | ICD-10-CM | POA: Diagnosis not present

## 2023-12-11 DIAGNOSIS — K7689 Other specified diseases of liver: Secondary | ICD-10-CM | POA: Diagnosis not present

## 2023-12-11 MED ORDER — IOHEXOL 300 MG/ML  SOLN
100.0000 mL | Freq: Once | INTRAMUSCULAR | Status: AC | PRN
  Administered 2023-12-11: 100 mL via INTRAVENOUS

## 2023-12-11 MED ORDER — SODIUM CHLORIDE (PF) 0.9 % IJ SOLN
INTRAMUSCULAR | Status: AC
  Filled 2023-12-11: qty 50

## 2023-12-11 NOTE — Telephone Encounter (Signed)
 Pt arrived for her appt scheduled for today.  Nothing further needed.

## 2023-12-11 NOTE — Telephone Encounter (Signed)
 Left another message for the patient with appt information.   Requested a call back letting me know she received my VM or a response to the Mychart message.    Dr Monnie Anthony to be made aware that we cannot reach the patient regarding her appt for today.

## 2023-12-12 ENCOUNTER — Other Ambulatory Visit: Payer: Self-pay | Admitting: Internal Medicine

## 2023-12-12 DIAGNOSIS — M546 Pain in thoracic spine: Secondary | ICD-10-CM

## 2023-12-19 ENCOUNTER — Ambulatory Visit: Payer: Medicare Other | Admitting: Family Medicine

## 2023-12-24 ENCOUNTER — Other Ambulatory Visit: Payer: Self-pay

## 2023-12-24 ENCOUNTER — Encounter: Payer: Self-pay | Admitting: *Deleted

## 2023-12-24 ENCOUNTER — Ambulatory Visit: Admitting: Sports Medicine

## 2023-12-24 VITALS — BP 132/84 | HR 84 | Ht 64.0 in | Wt 183.0 lb

## 2023-12-24 DIAGNOSIS — M1611 Unilateral primary osteoarthritis, right hip: Secondary | ICD-10-CM | POA: Diagnosis not present

## 2023-12-24 DIAGNOSIS — M25551 Pain in right hip: Secondary | ICD-10-CM | POA: Diagnosis not present

## 2023-12-24 MED ORDER — MELOXICAM 15 MG PO TABS: 15.0000 mg | ORAL_TABLET | Freq: Every day | ORAL | 0 refills | Status: DC | PRN

## 2023-12-24 NOTE — Progress Notes (Signed)
 Ben Nayden Czajka [name] [address] [place] [address] Phone: [phone]   Assessment and Plan:     1. Pain of right hip 2. Primary osteoarthritis of right hip  -Chronic with exacerbation, initial visit - Recurrence of right hip/groin pain over the past 2 months.  Most consistent with flare of mild degenerative changes in right femoral acetabular joint as seen on CT abdomen/pelvis on 12/11/2023 and physical exam at today's visit.  Patient does have degenerative changes at L5-S1, however back pain and radicular symptoms are not reproducible on physical exam - Patient has not had benefit with ibuprofen  use as needed, so we decided to forego prescription daily NSAID course.  Patient may instead use meloxicam  15 mg daily as needed for breakthrough pain.  Recommend limiting chronic NSAIDs to 1 dose per week.  Refill provided. - Patient elected for intra-articular hip CSI.  Tolerated well per note below.  CSI may temporarily increase blood pressure in patient with past medical history of hypertension - Start HEP for hip  15 additional minutes spent for educating Therapeutic Home Exercise Program.  This included exercises focusing on stretching, strengthening, with focus on eccentric aspects.   Long term goals include an improvement in range of motion, strength, endurance as well as avoiding reinjury. Patient's frequency would include in 1-2 times a day, 3-5 times a week for a duration of 6-12 weeks. Proper technique shown and discussed handout in great detail with ATC.  All questions were discussed and answered.    Procedure: Ultrasound Guided Hip Acetabulofemoral Joint Injection Side: Right Diagnosis: Flare of osteoarthritis US  Indication:  - accuracy is paramount for diagnosis - to ensure therapeutic efficacy or procedural success - to reduce procedural risk  After explaining the procedure, viable alternatives, risks, and answering any questions,  consent was given verbally. The site was cleaned with chlorhexidine prep. An ultrasound transducer was placed on the anterior thigh/hip.   The acetabular joint, labrum, and femoral shaft were identified.  The neurovascular structures were identified and an approach was found specifically avoiding these structures.  A steroid injection was performed under ultrasound guidance with sterile technique using 2ml of 1% lidocaine  without epinephrine and 40 mg of triamcinolone  (KENALOG ) 40mg /ml. This was well tolerated and resulted in  relief.  Needle was removed and dressing placed and post injection instructions were given including  a discussion of likely return of pain today after the anesthetic wears off (with the possibility of worsened pain) until the steroid starts to work in 1-3 days.   Pt was advised to call or return to clinic if these symptoms worsen or fail to improve as anticipated. Images permanently stored.   Pertinent previous records reviewed include CT abdomen pelvis 12/11/2023  Follow Up: 3 weeks for reevaluation.  If no improvement or worsening of symptoms, could consider NSAID course versus physical therapy versus targeted therapy for degenerative changes in lumbar spine   Subjective:   I, Olivia [name], am serving as a Neurosurgeon for Doctor Ulysees Gander  Chief Complaint: groin pain   HPI:   12/24/23 Patient is a 58 year old female with groin  pain. Patient states groin pain started about 2 months ago. Pain starts in the groin and goes to her low back. She has pain and pressure in her groin area. Ibu for the pain does not help. No numbness or tingling. Pressure is more noticeable than the pain. Decreased ROM due to pain. Heat helps a little. No pain  bearing down and or belly laugh. She isnt able to put pressure on her right side. She is afraid right leg will give out    Relevant Historical Information:   Hypertension, GERD  Additional pertinent review of systems  negative.   Current Outpatient Medications:    ascorbic acid (VITAMIN C) 250 MG tablet, Take 500 mg by mouth daily., Disp: , Rfl:    atorvastatin  (LIPITOR) 20 MG tablet, TAKE 1 TABLET(20 MG) BY MOUTH DAILY, Disp: 90 tablet, Rfl: 2   Biotin 1000 MCG tablet, Take 1,000 mcg by mouth daily., Disp: , Rfl:    Calcium  Carbonate-Vitamin D  (CALCIUM  PLUS VITAMIN D  PO), Take 2 capsules by mouth daily., Disp: , Rfl:    diltiazem  (CARDIZEM  CD) 180 MG 24 hr capsule, Take 1 capsule (180 mg total) by mouth daily., Disp: 90 capsule, Rfl: 1   esomeprazole (NEXIUM) 20 MG capsule, Take 20 mg by mouth daily at 12 noon., Disp: , Rfl:    fluticasone  (FLONASE ) 50 MCG/ACT nasal spray, Place 2 sprays into both nostrils daily., Disp: 16 g, Rfl: 6   furosemide  (LASIX ) 40 MG tablet, Take 1 tablet (40 mg total) by mouth daily., Disp: 90 tablet, Rfl: 3   gabapentin  (NEURONTIN ) 600 MG tablet, Take 600 mg by mouth at bedtime., Disp: , Rfl:    ibuprofen  (ADVIL ) 200 MG tablet, Take 600 mg by mouth every 6 (six) hours as needed., Disp: , Rfl:    lidocaine  (LIDODERM ) 5 %, Place 1 patch onto the skin daily as needed., Disp: 30 patch, Rfl: 0   lisinopril  (ZESTRIL ) 5 MG tablet, TAKE 1 TABLET(5 MG) BY MOUTH DAILY, Disp: 90 tablet, Rfl: 3   meloxicam  (MOBIC ) 15 MG tablet, Take 1 tablet (15 mg total) by mouth daily as needed for pain., Disp: 30 tablet, Rfl: 0   methocarbamol (ROBAXIN) 500 MG tablet, Take 500 mg by mouth as needed for muscle spasms., Disp: , Rfl:    Multiple Vitamins-Minerals (MULTIVITAMIN WITH MINERALS) tablet, Take 1 tablet by mouth daily., Disp: , Rfl:    nabumetone  (RELAFEN ) 500 MG tablet, Take 500 mg by mouth 2 (two) times daily., Disp: , Rfl:    potassium chloride  SA (KLOR-CON  M) 20 MEQ tablet, Take 1 tablet (20 mEq total) by mouth every other day., Disp: 45 tablet, Rfl: 3   sodium chloride  (OCEAN) 0.65 % SOLN nasal spray, Place 1 spray into both nostrils as needed for congestion., Disp: , Rfl:    Turmeric (QC  TUMERIC COMPLEX PO), Take by mouth daily., Disp: , Rfl:    vitamin E 400 UNIT capsule, Take 400 Units by mouth daily., Disp: , Rfl:    Objective:     Vitals:   12/24/23 1535  BP: 132/84  Pulse: 84  SpO2: 99%  Weight: 183 lb (83 kg)  Height: 5\' 4"  (1.626 m)      Body mass index is 31.41 kg/m.    Physical Exam:    General: awake, alert, and oriented no acute distress, nontoxic Skin: no suspicious lesions or rashes Neuro:sensation intact distally with no deficits, normal muscle tone, no atrophy, strength 5/5 in all tested lower ext groups Psych: normal mood and affect, speech clear   Right hip: No deformity, swelling or wasting ROM Flexion 80, ext 30, IR 35, ER 45 NTTP over the hip flexors, greater trochanter, gluteal musculature, si joint, lumbar spine Positive log roll with FROM for groin pain Positive FABER for groin pain Positive FADIR for groin pain Negative Piriformis test Negative trendelenberg  Gait antalgic, favoring left leg   Electronically signed by:  Marshall Skeeter [name] 4:13 PM 12/24/23

## 2023-12-24 NOTE — Progress Notes (Unsigned)
 No chief complaint on file.    Olivia [name] is a 58 y.o. female who presents for annual physical exam.  She is under the care of Dr. Lesta Rater for her GYN exams, last 02/2023.  She has hypertension, hypertensive heart disease with diastolic dysfunction noted on echo, palpitations, and hyperlipidemia. She is under the care of Dr. Addie Holstein; last saw cardiology for visit in 04/2023.  No changes to medication were made. She remains on diltiazem , lisinopril , furosemide  and potassium. She denies chest pain, palpitations, DOE, edema.  Hyperlipidemia: She is tolerating atorvastatin  without side effects.  Lipids at goal on last check, due for recheck.  Lab Results  Component Value Date   CHOL 161 12/20/2022   HDL 54 12/20/2022   LDLCALC 90 12/20/2022   TRIG 91 12/20/2022   CHOLHDL 3.0 12/20/2022    RLQ, R hip and R back pain-- Evaluated with CT 12/11/23: IMPRESSION: 1. No acute abnormality. 2. Normal appendix. 3. Stable dense calcification in the right atrium and lateral aspect of the tricuspid valve. 4. Stable multiple densely calcified uterine fibroids. 5. Small umbilical hernia containing fat. 6. Marked degenerative changes at the L5-S1 level with mild degenerative changes at the remaining levels of the lumbar and lower thoracic spine.  Reassured that appendix was normal. Felt to have MSK component, and recommended she f/u with sports med. She saw Dr. Cleora Daft yesterday, who felt her pain was consistent with flare of mild degenerative changes in right femoral acetabular joint. She was treated with intra-articular hip CSI  and HEP for hip.  She has h/o neck and back pain, prev under care of neurosurgery, with improvement after injections.    She goes to [facility] for B12 injections, weekly. ***UPDATE   Immunization History  Administered Date(s) Administered   Influenza-Unspecified 06/07/2005   Moderna Sars-Covid-2 Vaccination 11/14/2019, 12/12/2019   Td 03/29/1993, 04/29/2003    Tdap 12/23/2011  She declines flu shots and any additional COVID boosters Last Pap smear: 07/2020 normal no high risk HPV. Sees Dr. Lesta Rater regularly, last 02/2023. Last mammogram: 04/2023 Last colonoscopy:  09/2018 Dr. Tova Fresh, normal. 10 yr f/u Last DEXA: 12/2013, normal Dentist: sees dentist, periodontist and orthodontist Ophtho: a few years ago *** Exercise:   caring for young grandchildren No weight-bearing exercise.   Hep C screen done 11/2008 Vitamin D -OH normal at 46.3 in 09/2017   Patient Care Team: Roosvelt Colla, MD as PCP - General (Family Medicine) Arleen Lacer, MD as PCP - Cardiology (Cardiology) Tessa Figures, MD as Consulting Physician (Gastroenterology) Alethia Huxley, MD as Consulting Physician (Oncology) Kearney Passer, MD as Consulting Physician (Physical Medicine and Rehabilitation) GYN--Dr. Lesta Rater Sports Med--Dr. Cleora Daft Periodontist: Dr. Fernande Howells Oncologist: Dr. Maryalice Smaller Pain management--Dr. Crecencio Dodge  Derm: Dr. Baker Bon Ophtho: [facility]   PMH, PSH, SH and FH were reviewed and updated         ROS: The patient denies anorexia, fever, weight changes, headaches, vision changes, decreased hearing, ear pain, sore throat, breast concerns, chest pain, palpitations, dizziness, syncope, dyspnea on exertion, cough, swelling (just mild at end of day, per HPI), nausea, vomiting, diarrhea, constipation, abdominal pain, melena, hematochezia, hematuria, incontinence, dysuria, vaginal bleeding, discharge, odor or itch, genital lesions, joint pains, numbness, tingling, weakness, tremor, suspicious skin lesions, depression, anxiety, abnormal bleeding/bruising, or enlarged lymph nodes.  No cycles since 2010. Mild hot flashes H/o chronic pain--neck, back, doing well now, since injections.   R hip pain and RLQ pain per HPI. Recurrent reflux symptoms if she doesn't take OTC  Nexium Some intermittent urinary urgency. No dysuria, hematuria.     PHYSICAL EXAM:   There were no  vitals taken for this visit.  Wt Readings from Last 3 Encounters:  12/24/23 183 lb (83 kg)  12/10/23 179 lb 3.2 oz (81.3 kg)  10/29/23 179 lb (81.2 kg)    General Appearance:    Alert, cooperative, appears stated age. Not changed into gown.  Head:    Normocephalic, without obvious abnormality, atraumatic  Eyes:    PERRL, conjunctiva/corneas clear, EOM's intact, fundi    benign  Ears:    Normal TM's and external ear canals.   Nose:   No drainage or sinus tenderness  Throat:   Normal mucosa, no erythema or lesions.  Neck:   Supple, no lymphadenopathy;  thyroid :  no enlargement/ tenderness/nodules; no carotid bruit or JVD  Back:    Spine nontender, no curvature, ROM normal, no CVA  tenderness  Lungs:     Clear to auscultation bilaterally without wheezes, rales or  ronchi; respirations unlabored  Chest Wall:    No tenderness or deformity   Heart:    Regular rate and rhythm, S1 and S2 normal, no murmur, rub or gallop  Breast Exam:    Deferred to GYN  Abdomen:     Soft, non-tender, nondistended, normoactive bowel sounds, no masses, no hepatosplenomegaly.  ***  Genitalia:    Deferred to GYN       Extremities:   No clubbing, cyanosis or edema  Pulses:   2+ and symmetric all extremities  Skin:   Skin color, texture, turgor normal, no rashes or lesions, but skin exam very limited (not undressed)  Lymph nodes:   No abnormal cervical or supraclavicular nodes  Neurologic:   Normal strength, sensation and gait; reflexes 1+ and symmetric throughout. Cranial nerves 2-12 intact.               Psych:   Normal mood, affect, hygiene and grooming.     ***UPDATE abd if RLQ pai    ASSESSMENT/PLAN:     Discussed monthly self breast exams and yearly mammograms; at least 30 minutes of aerobic activity at least 5 days/week, weight-bearing exercise at least 2x/week; proper sunscreen use reviewed; healthy diet, including goals of calcium  and vitamin D  intake and alcohol recommendations (less than or equal  to 1 drink/day) reviewed; regular seatbelt use; changing batteries in smoke detectors.  Immunization recommendations discussed--yearly flu shots recommended, declined by patient. Prevnar 20 *** TdaP past due, needs to get from pharmacy (has Medicare).  Shingrix recommended (risks, side effects), encouraged her to get this from the pharmacy.   Declines further COVID vaccines. Colonoscopy recommendations reviewed, UTD, due again 09/2028  Past due for AWV

## 2023-12-24 NOTE — Patient Instructions (Signed)
 Hip HEP  Meloxicam  15 mg as needed limit to 1 time per week  3 week follow up

## 2023-12-24 NOTE — Patient Instructions (Incomplete)
  HEALTH MAINTENANCE RECOMMENDATIONS:  It is recommended that you get at least 30 minutes of aerobic exercise at least 5 days/week (for weight loss, you may need as much as 60-90 minutes). This can be any activity that gets your heart rate up. This can be divided in 10-15 minute intervals if needed, but try and build up your endurance at least once a week.  Weight bearing exercise is also recommended twice weekly.  Eat a healthy diet with lots of vegetables, fruits and fiber.  "Colorful" foods have a lot of vitamins (ie green vegetables, tomatoes, red peppers, etc).  Limit sweet tea, regular sodas and alcoholic beverages, all of which has a lot of calories and sugar.  Up to 1 alcoholic drink daily may be beneficial for women (unless trying to lose weight, watch sugars).  Drink a lot of water.  Calcium  recommendations are 1200-1500 mg daily (1500 mg for postmenopausal women or women without ovaries), and vitamin D  1000 IU daily.  This should be obtained from diet and/or supplements (vitamins), and calcium  should not be taken all at once, but in divided doses.  Monthly self breast exams and yearly mammograms for women over the age of 35 is recommended.  Sunscreen of at least SPF 30 should be used on all sun-exposed parts of the skin when outside between the hours of 10 am and 4 pm (not just when at beach or pool, but even with exercise, golf, tennis, and yard work!)  Use a sunscreen that says "broad spectrum" so it covers both UVA and UVB rays, and make sure to reapply every 1-2 hours.  Remember to change the batteries in your smoke detectors when changing your clock times in the spring and fall. Carbon monoxide detectors are recommended for your home.  Use your seat belt every time you are in a car, and please drive safely and not be distracted with cell phones and texting while driving.  Please get tetanus booster (TdaP) from the pharmacy.  This was due in 2023 (once every 10 years).  I recommend  getting the shingles vaccine (Shingrix). It is a series of 2 shots, given 2 months apart and provides 90% efficacy in preventing shingles. You need to get this from the pharmacy.  You can get this the same day as other vaccines, or separate them by 2 weeks.  Yearly flu shots are recommended in the Fall.

## 2023-12-25 ENCOUNTER — Ambulatory Visit (INDEPENDENT_AMBULATORY_CARE_PROVIDER_SITE_OTHER): Payer: Medicare Other | Admitting: Family Medicine

## 2023-12-25 ENCOUNTER — Encounter: Payer: Self-pay | Admitting: Family Medicine

## 2023-12-25 VITALS — BP 130/72 | HR 72 | Ht 65.0 in | Wt 179.8 lb

## 2023-12-25 DIAGNOSIS — Z23 Encounter for immunization: Secondary | ICD-10-CM

## 2023-12-25 DIAGNOSIS — Z853 Personal history of malignant neoplasm of breast: Secondary | ICD-10-CM | POA: Diagnosis not present

## 2023-12-25 DIAGNOSIS — I10 Essential (primary) hypertension: Secondary | ICD-10-CM

## 2023-12-25 DIAGNOSIS — Z0000 Encounter for general adult medical examination without abnormal findings: Secondary | ICD-10-CM | POA: Diagnosis not present

## 2023-12-25 DIAGNOSIS — E785 Hyperlipidemia, unspecified: Secondary | ICD-10-CM | POA: Diagnosis not present

## 2023-12-25 DIAGNOSIS — M545 Low back pain, unspecified: Secondary | ICD-10-CM

## 2023-12-25 DIAGNOSIS — M25551 Pain in right hip: Secondary | ICD-10-CM | POA: Diagnosis not present

## 2023-12-26 ENCOUNTER — Ambulatory Visit: Payer: Self-pay | Admitting: Family Medicine

## 2023-12-26 ENCOUNTER — Other Ambulatory Visit: Payer: Self-pay | Admitting: *Deleted

## 2023-12-26 DIAGNOSIS — E785 Hyperlipidemia, unspecified: Secondary | ICD-10-CM

## 2023-12-26 LAB — COMPREHENSIVE METABOLIC PANEL WITH GFR
ALT: 14 IU/L (ref 0–32)
AST: 17 IU/L (ref 0–40)
Albumin: 4.5 g/dL (ref 3.8–4.9)
Alkaline Phosphatase: 91 IU/L (ref 44–121)
BUN/Creatinine Ratio: 15 (ref 9–23)
BUN: 12 mg/dL (ref 6–24)
Bilirubin Total: 0.4 mg/dL (ref 0.0–1.2)
CO2: 19 mmol/L — ABNORMAL LOW (ref 20–29)
Calcium: 9.8 mg/dL (ref 8.7–10.2)
Chloride: 105 mmol/L (ref 96–106)
Creatinine, Ser: 0.81 mg/dL (ref 0.57–1.00)
Globulin, Total: 2.8 g/dL (ref 1.5–4.5)
Glucose: 103 mg/dL — ABNORMAL HIGH (ref 70–99)
Potassium: 4 mmol/L (ref 3.5–5.2)
Sodium: 142 mmol/L (ref 134–144)
Total Protein: 7.3 g/dL (ref 6.0–8.5)
eGFR: 85 mL/min/{1.73_m2} (ref >59)

## 2023-12-26 LAB — LIPID PANEL
Chol/HDL Ratio: 2.9 ratio (ref 0.0–4.4)
Cholesterol, Total: 201 mg/dL — ABNORMAL HIGH (ref 100–199)
HDL: 70 mg/dL (ref >39)
LDL Chol Calc (NIH): 113 mg/dL — ABNORMAL HIGH (ref 0–99)
Triglycerides: 99 mg/dL (ref 0–149)
VLDL Cholesterol Cal: 18 mg/dL (ref 5–40)

## 2023-12-26 MED ORDER — ATORVASTATIN CALCIUM 20 MG PO TABS: ORAL_TABLET | ORAL | 1 refills | Status: DC

## 2023-12-31 ENCOUNTER — Other Ambulatory Visit: Payer: Self-pay | Admitting: Family Medicine

## 2023-12-31 DIAGNOSIS — E785 Hyperlipidemia, unspecified: Secondary | ICD-10-CM

## 2023-12-31 NOTE — Progress Notes (Signed)
 Left message to ask patient if she would prefer this.

## 2023-12-31 NOTE — Progress Notes (Signed)
 Called patient again to follow up, left message.

## 2024-01-10 NOTE — Progress Notes (Deleted)
 Olivia [name] [name] [address] [place] [address] Phone: [phone]   Assessment and Plan:     There are no diagnoses linked to this encounter.  ***   Pertinent previous records reviewed include ***    Follow Up: ***     Subjective:    Chief Complaint: groin pain  HPI:  12/24/23 Patient is a 58 year old female with groin  pain. Patient states groin pain started about 2 months ago. Pain starts in the groin and goes to her low back. She has pain and pressure in her groin area. Ibu for the pain does not help. No numbness or tingling. Pressure is more noticeable than the pain. Decreased ROM due to pain. Heat helps a little. No pain bearing down and or belly laugh. She isnt able to put pressure on her right side. She is afraid right leg will give out  01/14/24 Patient states  Relevant Historical Information: ***  Additional pertinent review of systems negative.   Current Outpatient Medications:    ascorbic acid (VITAMIN C) 250 MG tablet, Take 500 mg by mouth daily., Disp: , Rfl:    atorvastatin  (LIPITOR) 20 MG tablet, TAKE 1 TABLET(20 MG) BY MOUTH DAILY, Disp: 90 tablet, Rfl: 1   Biotin 1000 MCG tablet, Take 1,000 mcg by mouth daily., Disp: , Rfl:    Calcium  Carbonate-Vitamin D  (CALCIUM  PLUS VITAMIN D  PO), Take 2 capsules by mouth daily., Disp: , Rfl:    diltiazem  (CARDIZEM  CD) 180 MG 24 hr capsule, Take 1 capsule (180 mg total) by mouth daily., Disp: 90 capsule, Rfl: 1   esomeprazole (NEXIUM) 20 MG capsule, Take 20 mg by mouth daily at 12 noon., Disp: , Rfl:    fluticasone  (FLONASE ) 50 MCG/ACT nasal spray, Place 2 sprays into both nostrils daily., Disp: 16 g, Rfl: 6   furosemide  (LASIX ) 40 MG tablet, Take 1 tablet (40 mg total) by mouth daily., Disp: 90 tablet, Rfl: 3   gabapentin  (NEURONTIN ) 600 MG tablet, Take 600 mg by mouth at bedtime., Disp: , Rfl:    ibuprofen  (ADVIL ) 200 MG tablet, Take 600 mg by mouth every 6 (six)  hours as needed., Disp: , Rfl:    lidocaine  (LIDODERM ) 5 %, Place 1 patch onto the skin daily as needed. (Patient not taking: Reported on 12/25/2023), Disp: 30 patch, Rfl: 0   lisinopril  (ZESTRIL ) 5 MG tablet, TAKE 1 TABLET(5 MG) BY MOUTH DAILY, Disp: 90 tablet, Rfl: 3   meloxicam  (MOBIC ) 15 MG tablet, Take 1 tablet (15 mg total) by mouth daily as needed for pain., Disp: 30 tablet, Rfl: 0   methocarbamol (ROBAXIN) 500 MG tablet, Take 500 mg by mouth as needed for muscle spasms. (Patient not taking: Reported on 12/25/2023), Disp: , Rfl:    Multiple Vitamins-Minerals (MULTIVITAMIN WITH MINERALS) tablet, Take 1 tablet by mouth daily., Disp: , Rfl:    nabumetone  (RELAFEN ) 500 MG tablet, Take 500 mg by mouth 2 (two) times daily. (Patient not taking: Reported on 12/25/2023), Disp: , Rfl:    potassium chloride  SA (KLOR-CON  M) 20 MEQ tablet, Take 1 tablet (20 mEq total) by mouth every other day., Disp: 45 tablet, Rfl: 3   sodium chloride  (OCEAN) 0.65 % SOLN nasal spray, Place 1 spray into both nostrils as needed for congestion., Disp: , Rfl:    Turmeric (QC TUMERIC COMPLEX PO), Take by mouth daily., Disp: , Rfl:    vitamin E 400 UNIT capsule, Take 400 Units by mouth  daily., Disp: , Rfl:    Objective:     There were no vitals filed for this visit.    There is no height or weight on file to calculate BMI.    Physical Exam:    ***   Electronically signed by:  Marshall Skeeter [name] 9:55 AM 01/10/24

## 2024-01-14 ENCOUNTER — Ambulatory Visit: Admitting: Sports Medicine

## 2024-01-21 NOTE — Progress Notes (Deleted)
 [name] D.[facility] [address] [place] [address] Phone: [phone]   Assessment and Plan:     There are no diagnoses linked to this encounter.  ***   Pertinent previous records reviewed include ***    Follow Up: ***     Subjective:   I, Paislie Tessler, am serving as a Neurosurgeon for Doctor Morene Mace  Chief Complaint: groin pain  HPI:  12/24/23 Patient is a 58 year old female with groin  pain. Patient states groin pain started about 2 months ago. Pain starts in the groin and goes to her low back. She has pain and pressure in her groin area. Ibu for the pain does not help. No numbness or tingling. Pressure is more noticeable than the pain. Decreased ROM due to pain. Heat helps a little. No pain bearing down and or belly laugh. She isnt able to put pressure on her right side. She is afraid right leg will give out  01/28/2024 Patient states  Relevant Historical Information: ***  Additional pertinent review of systems negative.   Current Outpatient Medications:    ascorbic acid (VITAMIN C) 250 MG tablet, Take 500 mg by mouth daily., Disp: , Rfl:    atorvastatin  (LIPITOR) 20 MG tablet, TAKE 1 TABLET(20 MG) BY MOUTH DAILY, Disp: 90 tablet, Rfl: 1   Biotin 1000 MCG tablet, Take 1,000 mcg by mouth daily., Disp: , Rfl:    Calcium  Carbonate-Vitamin D  (CALCIUM  PLUS VITAMIN D  PO), Take 2 capsules by mouth daily., Disp: , Rfl:    diltiazem  (CARDIZEM  CD) 180 MG 24 hr capsule, Take 1 capsule (180 mg total) by mouth daily., Disp: 90 capsule, Rfl: 1   esomeprazole (NEXIUM) 20 MG capsule, Take 20 mg by mouth daily at 12 noon., Disp: , Rfl:    fluticasone  (FLONASE ) 50 MCG/ACT nasal spray, Place 2 sprays into both nostrils daily., Disp: 16 g, Rfl: 6   furosemide  (LASIX ) 40 MG tablet, Take 1 tablet (40 mg total) by mouth daily., Disp: 90 tablet, Rfl: 3   gabapentin  (NEURONTIN ) 600 MG tablet, Take 600 mg by mouth at bedtime., Disp: , Rfl:     ibuprofen  (ADVIL ) 200 MG tablet, Take 600 mg by mouth every 6 (six) hours as needed., Disp: , Rfl:    lidocaine  (LIDODERM ) 5 %, Place 1 patch onto the skin daily as needed. (Patient not taking: Reported on 12/25/2023), Disp: 30 patch, Rfl: 0   lisinopril  (ZESTRIL ) 5 MG tablet, TAKE 1 TABLET(5 MG) BY MOUTH DAILY, Disp: 90 tablet, Rfl: 3   meloxicam  (MOBIC ) 15 MG tablet, Take 1 tablet (15 mg total) by mouth daily as needed for pain., Disp: 30 tablet, Rfl: 0   methocarbamol (ROBAXIN) 500 MG tablet, Take 500 mg by mouth as needed for muscle spasms. (Patient not taking: Reported on 12/25/2023), Disp: , Rfl:    Multiple Vitamins-Minerals (MULTIVITAMIN WITH MINERALS) tablet, Take 1 tablet by mouth daily., Disp: , Rfl:    nabumetone  (RELAFEN ) 500 MG tablet, Take 500 mg by mouth 2 (two) times daily. (Patient not taking: Reported on 12/25/2023), Disp: , Rfl:    potassium chloride  SA (KLOR-CON  M) 20 MEQ tablet, Take 1 tablet (20 mEq total) by mouth every other day., Disp: 45 tablet, Rfl: 3   sodium chloride  (OCEAN) 0.65 % SOLN nasal spray, Place 1 spray into both nostrils as needed for congestion., Disp: , Rfl:    Turmeric (QC TUMERIC COMPLEX PO), Take by mouth daily., Disp: , Rfl:  vitamin E 400 UNIT capsule, Take 400 Units by mouth daily., Disp: , Rfl:    Objective:     There were no vitals filed for this visit.    There is no height or weight on file to calculate BMI.    Physical Exam:    ***   Electronically signed by:  Odis Mace D.[facility] 8:01 AM 01/21/24

## 2024-01-28 ENCOUNTER — Ambulatory Visit: Admitting: Sports Medicine

## 2024-01-29 ENCOUNTER — Ambulatory Visit: Admitting: Sports Medicine

## 2024-01-29 VITALS — BP 142/80 | HR 77 | Ht 65.0 in | Wt 181.4 lb

## 2024-01-29 DIAGNOSIS — M25551 Pain in right hip: Secondary | ICD-10-CM

## 2024-01-29 DIAGNOSIS — M1611 Unilateral primary osteoarthritis, right hip: Secondary | ICD-10-CM

## 2024-01-29 MED ORDER — MELOXICAM 15 MG PO TABS: 15.0000 mg | ORAL_TABLET | Freq: Every day | ORAL | 0 refills | Status: AC

## 2024-01-29 NOTE — Patient Instructions (Signed)
-   Start meloxicam  15 mg daily x2 weeks.  If still having pain after 2 weeks, complete 3rd-week of NSAID. May use remaining NSAID as needed once daily for pain control.  Do not to use additional over-the-counter NSAIDs (ibuprofen , naproxen, Advil , Aleve, etc.) while taking prescription NSAIDs.  May use Tylenol  [phone] mg 2 to 3 times a day for breakthrough pain.   - Use meloxicam  15 mg daily as needed for pain.  Recommend limiting chronic NSAIDs to 1-2 doses per week to prevent long-term side effects.  May use Voltaren  gel and topical lidocaine    Stationary bike, elliptical, and water aerobics   2 month follow up

## 2024-01-29 NOTE — Progress Notes (Signed)
 Ben Kaisyn Reinhold D.[facility] [address] [place] [address] Phone: [phone]   Assessment and Plan:     1. Pain of right hip (Primary) 2. Primary osteoarthritis of right hip -Chronic with exacerbation, subsequent visit - Patient had complete resolution of symptoms after intra-articular hip CSI performed on 12/24/2023, however pain has returned over the past 1 week.  Patient had mild degenerative changes seen on recent CT abdomen pelvis, so patient may be experiencing a flare of mild osteoarthritis after increasing physical activity walking - Recommend low impact exercise such as stationary bike, elliptical, water aerobics - Start meloxicam  15 mg daily x2 weeks.  If still having pain after 2 weeks, complete 3rd-week of NSAID. May use remaining NSAID as needed once daily for pain control.  Do not to use additional over-the-counter NSAIDs (ibuprofen , naproxen, Advil , Aleve, etc.) while taking prescription NSAIDs.  May use Tylenol  [phone] mg 2 to 3 times a day for breakthrough pain. -May use topical medication such as Voltaren  gel, lidocaine  cream as needed - Continue HEP as tolerated - Discussed we will ideally wait at least 3 months minimum in between CSI    Pertinent previous records reviewed include none  Follow Up: 2 months for reevaluation.  Could consider repeat intra-articular CSI.  Could consider prednisone  Dosepak.  Could consider advanced imaging   Subjective:   I, Claretha Schimke am a scribe for Dr. Leonce.   Chief Complaint: groin pain   HPI:   12/24/23 Patient is a 58 year old female with groin pain. Patient states groin pain started about 2 months ago. Pain starts in the groin and goes to her low back. She has pain and pressure in her groin area. Ibu for the pain does not help. No numbness or tingling. Pressure is more noticeable than the pain. Decreased ROM due to pain. Heat helps a little. No pain bearing down and or belly  laugh. She isnt able to put pressure on her right side. She is afraid right leg will give out.   01/29/24 Patient states she has been in pain since the 27th of June. It has been persistent and has not left yet. Been trying to walk it out but it isn't working.  Relevant Historical Information: GERD  Additional pertinent review of systems negative.   Current Outpatient Medications:    ascorbic acid (VITAMIN C) 250 MG tablet, Take 500 mg by mouth daily., Disp: , Rfl:    atorvastatin  (LIPITOR) 20 MG tablet, TAKE 1 TABLET(20 MG) BY MOUTH DAILY, Disp: 90 tablet, Rfl: 1   Biotin 1000 MCG tablet, Take 1,000 mcg by mouth daily., Disp: , Rfl:    Calcium  Carbonate-Vitamin D  (CALCIUM  PLUS VITAMIN D  PO), Take 2 capsules by mouth daily., Disp: , Rfl:    diltiazem  (CARDIZEM  CD) 180 MG 24 hr capsule, Take 1 capsule (180 mg total) by mouth daily., Disp: 90 capsule, Rfl: 1   esomeprazole (NEXIUM) 20 MG capsule, Take 20 mg by mouth daily at 12 noon., Disp: , Rfl:    fluticasone  (FLONASE ) 50 MCG/ACT nasal spray, Place 2 sprays into both nostrils daily., Disp: 16 g, Rfl: 6   furosemide  (LASIX ) 40 MG tablet, Take 1 tablet (40 mg total) by mouth daily., Disp: 90 tablet, Rfl: 3   gabapentin  (NEURONTIN ) 600 MG tablet, Take 600 mg by mouth at bedtime., Disp: , Rfl:    ibuprofen  (ADVIL ) 200 MG tablet, Take 600 mg by mouth every 6 (six) hours as needed., Disp: ,  Rfl:    lidocaine  (LIDODERM ) 5 %, Place 1 patch onto the skin daily as needed., Disp: 30 patch, Rfl: 0   lisinopril  (ZESTRIL ) 5 MG tablet, TAKE 1 TABLET(5 MG) BY MOUTH DAILY, Disp: 90 tablet, Rfl: 3   methocarbamol (ROBAXIN) 500 MG tablet, Take 500 mg by mouth as needed for muscle spasms., Disp: , Rfl:    Multiple Vitamins-Minerals (MULTIVITAMIN WITH MINERALS) tablet, Take 1 tablet by mouth daily., Disp: , Rfl:    nabumetone  (RELAFEN ) 500 MG tablet, Take 500 mg by mouth 2 (two) times daily., Disp: , Rfl:    potassium chloride  SA (KLOR-CON  M) 20 MEQ tablet,  Take 1 tablet (20 mEq total) by mouth every other day., Disp: 45 tablet, Rfl: 3   sodium chloride  (OCEAN) 0.65 % SOLN nasal spray, Place 1 spray into both nostrils as needed for congestion., Disp: , Rfl:    Turmeric (QC TUMERIC COMPLEX PO), Take by mouth daily., Disp: , Rfl:    vitamin E 400 UNIT capsule, Take 400 Units by mouth daily., Disp: , Rfl:    meloxicam  (MOBIC ) 15 MG tablet, Take 1 tablet (15 mg total) by mouth daily., Disp: 30 tablet, Rfl: 0   Objective:     Vitals:   01/29/24 1321  BP: (!) 142/80  Pulse: 77  SpO2: 99%  Weight: 181 lb 6.4 oz (82.3 kg)  Height: 5' 5 (1.651 m)      Body mass index is 30.19 kg/m.    Physical Exam:    General: awake, alert, and oriented no acute distress, nontoxic Skin: no suspicious lesions or rashes Neuro:sensation intact distally with no deficits, normal muscle tone, no atrophy, strength 5/5 in all tested lower ext groups Psych: normal mood and affect, speech clear   Right hip: No deformity, swelling or wasting ROM Flexion 80, ext 30, IR 35, ER 45 NTTP over the hip flexors, greater trochanter, gluteal musculature, si joint, lumbar spine Positive log roll with FROM for groin pain Positive FABER for groin pain Positive FADIR for groin pain Negative Piriformis test Negative trendelenberg Gait antalgic, favoring left leg    Electronically signed by:  Odis Mace [facility] 1:56 PM 01/29/24

## 2024-02-04 ENCOUNTER — Ambulatory Visit: Admitting: Sports Medicine

## 2024-03-16 ENCOUNTER — Telehealth: Payer: Self-pay | Admitting: *Deleted

## 2024-03-16 NOTE — Telephone Encounter (Signed)
 Copied from CRM #[id number]. Topic: General - Other >> Mar 16, 2024  1:31 PM Olivia [name] wrote: Reason for CRM: Patient wants to let Dr Randol know she will have her labs done on 10/09. Also requested Dr Randol contact her directly has some things she would like to discuss directly with her.  Patient can be reached at [phone]  Called patient and discussed her concerns with her.

## 2024-03-19 ENCOUNTER — Ambulatory Visit (INDEPENDENT_AMBULATORY_CARE_PROVIDER_SITE_OTHER): Admitting: Medical

## 2024-03-19 ENCOUNTER — Ambulatory Visit
Admission: RE | Admit: 2024-03-19 | Discharge: 2024-03-19 | Disposition: A | Discharge status: Home / Self Care | Source: Ambulatory Visit | Attending: Medical | Admitting: Medical

## 2024-03-19 ENCOUNTER — Encounter: Payer: Self-pay | Admitting: Medical

## 2024-03-19 ENCOUNTER — Ambulatory Visit: Admitting: Family Medicine

## 2024-03-19 ENCOUNTER — Ambulatory Visit: Payer: Self-pay | Admitting: Medical

## 2024-03-19 VITALS — BP 138/84 | HR 80 | Ht 65.0 in | Wt 181.0 lb

## 2024-03-19 DIAGNOSIS — S060X0A Concussion without loss of consciousness, initial encounter: Secondary | ICD-10-CM

## 2024-03-19 DIAGNOSIS — Z01419 Encounter for gynecological examination (general) (routine) without abnormal findings: Secondary | ICD-10-CM | POA: Diagnosis not present

## 2024-03-19 DIAGNOSIS — R87610 Atypical squamous cells of undetermined significance on cytologic smear of cervix (ASC-US): Secondary | ICD-10-CM | POA: Diagnosis not present

## 2024-03-19 DIAGNOSIS — S0990XA Unspecified injury of head, initial encounter: Secondary | ICD-10-CM | POA: Diagnosis not present

## 2024-03-19 DIAGNOSIS — R519 Headache, unspecified: Secondary | ICD-10-CM | POA: Diagnosis not present

## 2024-03-19 NOTE — Progress Notes (Signed)
 Subjective:  Olivia [name] is a 58 y.o. female who presents for Chief Complaint  Patient presents with   Follow-up    Bumped head last Friday. Headaches and light headed     Here for concern. DOI 03/13/24.  Accompanied by her husband today.  Was at the kitchen sink, went to get something in the cabinet below, bent down and accidentally hit forehead on granite.   No fall, no LOC.  It kind of stunned her for a minute.  Was ok for a few hours, but then started having headache for 3-4 days.  By 4th day got improvement on headache.   There was a little swelling of the forward.  No bruising.  She has had some slight intermittent headache the last few days.     Dizziness started 2 days ago.  Not on any blood thinners.    No hx/o frequent headaches.  No new numbness, tingling or weakness. Feels lightheaded like vertigo.  Felt a little wobbly on her feet.   But no fall.   No vision changes.    Has used a little ibuprofen , 3 times daily last week and a little less the last few days.  Not currently taking meloxicam .  Irritable some.  No nausea or vomiting.  No confusion or slurred speech.  The first few days after the injury she just rested.  When she got back to doing her normal routine, the dizziness and irritability started.   Full time student and works as a Contractor.    No other aggravating or relieving factors.    No other c/o.  Past Medical History:  Diagnosis Date   Breast cancer, stage 1 (HCC)    Colon polyp    GERD (gastroesophageal reflux disease)    H/O diastolic dysfunction 05/2013   Grade 2 DD. 1.3 x 1.3 cm mass in RA on Echo -->Cardiac MRI 12/04: 1.9 x 1.6 cm mass in the lateral RA cavity. -- Most suggestive of thrombus--  calcified and  lamina and ted suggestive of chronic.; EF from 50% with no scar.   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   Uterine fibroid    s/p embolization   Current Outpatient Medications on File Prior to Visit  Medication Sig Dispense Refill   ascorbic acid (VITAMIN C) 250 MG tablet Take 500 mg by mouth daily.     atorvastatin  (LIPITOR) 20 MG tablet TAKE 1 TABLET(20 MG) BY MOUTH DAILY 90 tablet 1   Biotin 1000 MCG tablet Take 1,000 mcg by mouth daily.     Calcium  Carbonate-Vitamin D  (CALCIUM  PLUS VITAMIN D  PO) Take 2 capsules by mouth daily.     diltiazem  (CARDIZEM  CD) 180 MG 24 hr capsule Take 1 capsule (180 mg total) by mouth daily. 90 capsule 1   esomeprazole (NEXIUM) 20 MG capsule Take 20 mg by mouth daily at 12 noon.     fluticasone  (FLONASE ) 50 MCG/ACT nasal spray Place 2 sprays into both nostrils daily. 16 g 6   furosemide  (LASIX ) 40 MG tablet Take 1 tablet (40 mg total) by mouth daily. 90 tablet 3  gabapentin  (NEURONTIN ) 600 MG tablet Take 600 mg by mouth at bedtime.     ibuprofen  (ADVIL ) 200 MG tablet Take 600 mg by mouth every 6 (six) hours as needed.     lidocaine  (LIDODERM ) 5 % Place 1 patch onto the skin daily as needed. 30 patch 0   lisinopril  (ZESTRIL ) 5 MG tablet TAKE 1 TABLET(5 MG) BY MOUTH DAILY 90 tablet 3   meloxicam  (MOBIC ) 15 MG tablet Take 1 tablet (15 mg total) by mouth daily. 30 tablet 0   methocarbamol (ROBAXIN) 500 MG tablet Take 500 mg by mouth as needed for muscle spasms.     Multiple Vitamins-Minerals (MULTIVITAMIN WITH MINERALS) tablet Take 1 tablet by mouth daily.     nabumetone  (RELAFEN ) 500 MG tablet Take 500 mg by mouth 2 (two) times daily.     potassium chloride  SA (KLOR-CON  M) 20 MEQ tablet Take 1 tablet (20 mEq total) by mouth every other day. 45 tablet 3   sodium chloride  (OCEAN) 0.65 % SOLN nasal spray Place 1 spray into both nostrils  as needed for congestion.     Turmeric (QC TUMERIC COMPLEX PO) Take by mouth daily.     vitamin E 400 UNIT capsule Take 400 Units by mouth daily.     No current facility-administered medications on file prior to visit.     The following portions of the patient's history were reviewed and updated as appropriate: allergies, current medications, past family history, past medical history, past social history, past surgical history and problem list.  ROS Otherwise as in subjective above   Objective: BP 138/84 (BP Location: Left Arm, Patient Position: Sitting, Cuff Size: Normal)   Pulse 80   Ht 5' 5 (1.651 m)   Wt 181 lb (82.1 kg)   SpO2 98%   BMI 30.12 kg/m   General appearance: alert, no distress, well developed, well nourished HEENT: normocephalic, sclerae anicteric, conjunctiva pink and moist, TMs pearly, nares patent, no discharge or erythema, pharynx normal Oral cavity: MMM, no lesions Neck: supple, no lymphadenopathy, no thyromegaly, no masses Heart: RRR, normal S1, S2, no murmurs Lungs: CTA bilaterally, no wheezes, rhonchi, or rales Abdomen: +bs, soft, non tender, non distended, no masses, no hepatomegaly, no splenomegaly Pulses: 2+ radial pulses, 2+ pedal pulses, normal cap refill Ext: no edema   Assessment: Encounter Diagnoses  Name Primary?   Traumatic injury of head, initial encounter Yes   Concussion without loss of consciousness, initial encounter      Plan: We discussed her concerns.  She is requesting a head scan to rule out bleed or other.  Symptoms suggest mild concussion.  We discussed gradual stepwise return to activity and complete rest.  Advised for the next few days to use complete rest.  As one of her symptoms, which include headache, dizziness and irritability, improved she can add back some mild activity.  She can use Tylenol  over-the-counter for headache or pain  She has gabapentin  that she uses as needed.  I will have her use her gabapentin   daily for the next few days to help some of her symptoms as well  Avoid re injury or repeat head injury in the near future.  If any new symptoms or other worsening symptoms in the next few days, then call or get reevaluated  Her exam and neurological exam and MMSE is normal today   Tramya was seen today for follow-up.  Diagnoses and all orders for this visit:  Traumatic injury of head, initial encounter -  CT HEAD WO CONTRAST ( ); Future  Concussion without loss of consciousness, initial encounter -     CT HEAD WO CONTRAST ( ); Future    Follow up: pending scan

## 2024-03-19 NOTE — Patient Instructions (Signed)
Concussion, Adult  A concussion is a brain injury from a hard, direct hit (trauma) to your head or body. This hit causes your brain to quickly shake back and forth inside your skull. A concussion may also be called a mild traumatic brain injury (TBI). Healing from this injury can take time. The effects of a concussion can be serious. If you have a concussion, you should be very careful to avoid having a second concussion. What are the causes? This condition is caused by: A direct hit to your head. A quick and sudden movement of the head or neck, such as in a car crash. What are the signs or symptoms? The signs of a concussion can be hard to notice. They may be missed by you, family members, and doctors. You may look fine on the outside but may not act or feel normal. Physical symptoms Headaches or feeling dizzy. Problems with body balance. Being sensitive to light or noise. Vomiting or feeling like you may vomit. Being tired. Problems seeing or hearing. Seizure. Mental and emotional symptoms Feeling grouchy (irritable) or having mood changes. Problems remembering things. Trouble focusing your mind (concentrating), organizing, or making decisions. Not sleeping or eating as you used to. Being slow to think, act, react, speak, or read. Feeling worried or nervous (anxious). Feeling sad (depressed). How is this treated? This condition may be treated by: Stopping sports or activity if you are injured. Resting your body and your mind. Being watched carefully, often at home. Medicines to help with symptoms such as: Headaches. Feeling like you may vomit. Problems with sleep. You may need to go to a concussion clinic or a place to help you recover (rehab). Follow these instructions at home: Activity Limit activities that need a lot of thought or focus, such as: Homework or work for your job. Watching TV. Using the computer or phone. Playing memory games and puzzles. Get rest because  this helps your brain heal. Make sure you: Get plenty of sleep. Most adults should get 7-9 hours of sleep each night. Rest during the day. Take naps or breaks when you feel tired. Avoid activity or exercise that takes a lot of effort until your doctor says it is safe. Stop any activity that makes symptoms worse. Your doctor may tell you to do light exercise like walking. Do not do activities that could cause a second concussion, such as riding a bike or playing sports. Ask your doctor when you can return to your normal activities, such as school, work, sports, and driving. Your ability to react may be slower. Do not do these activities if you are dizzy. General instructions  Take over-the-counter and prescription medicines only as told by your doctor. Avoid taking strong pain medicines (opioids) after a concussion. Do not drink alcohol until your doctor says you can. Watch your symptoms and tell other people to do the same. Other problems can occur after a concussion. Tell your work manager, teachers, school nurse, school counselor, coach, or athletic trainer about your injury and symptoms. Tell them about what you can or cannot do. See a mental health therapist if you keep feeling worried and nervous or sad. Keep all follow-up visits. Your doctor will check on your recovery and give you a plan for returning to activities. How is this prevented? It is very important that you do not get another brain injury. In rare cases, another injury can cause brain damage that will not go away, brain swelling, or death. The risk of this   is greatest in the first 7-10 days after a head injury. To avoid injuries: Stop activities that could lead to a second concussion, such as contact sports, until your doctor says it is okay. When you return to sports or activities: Do not crash into other players. This is how most concussions happen. Follow the rules. Respect other players. Do not engage in violent  behavior while playing. Get regular exercise. Do strength and balance training. Wear a helmet that fits you well during sports, biking, or other activities. Helmets can help protect you from serious skull and brain injuries, but they may not protect you from a concussion. Even when wearing a helmet, you should avoid being hit in the head. Where to find more information Centers for Disease Control and Prevention: cdc.gov Contact a doctor if: Your symptoms do not get better or get worse. You have new symptoms. You have another injury. Your balance gets worse. You have changes in how you act. Get help right away if: You have very bad headaches or your headaches get worse. You have any of these problems: Feeling weak or numb in any part of your body. Slurred speech. Changes in how you see (vision). Feeling mixed up (confused). You vomit often. You faint or other people have trouble waking you up. You have a seizure. These symptoms may be an emergency. Get help right away. Call 911. Do not wait to see if the symptoms will go away. Do not drive yourself to the hospital. Also, get help right away if: You have thoughts of hurting yourself or others. Take one of these steps if you feel like you may hurt yourself or others, or have thoughts about taking your own life: Go to your nearest emergency room. Call 911. Call the National Suicide Prevention Lifeline at 1-800-273-8255 or 988. This is open 24 hours a day. Text the Crisis Text Line at 741741. This information is not intended to replace advice given to you by your health care provider. Make sure you discuss any questions you have with your health care provider. Document Revised: 12/08/2021 Document Reviewed: 12/08/2021 Elsevier Patient Education  2024 [employer]  

## 2024-03-27 DIAGNOSIS — K080 Exfoliation of teeth due to systemic causes: Secondary | ICD-10-CM | POA: Diagnosis not present

## 2024-03-31 ENCOUNTER — Other Ambulatory Visit: Payer: Self-pay | Admitting: Cardiology

## 2024-03-31 DIAGNOSIS — R0602 Shortness of breath: Secondary | ICD-10-CM

## 2024-03-31 DIAGNOSIS — I5030 Unspecified diastolic (congestive) heart failure: Secondary | ICD-10-CM

## 2024-03-31 DIAGNOSIS — E785 Hyperlipidemia, unspecified: Secondary | ICD-10-CM

## 2024-03-31 DIAGNOSIS — I110 Hypertensive heart disease with heart failure: Secondary | ICD-10-CM

## 2024-03-31 DIAGNOSIS — R002 Palpitations: Secondary | ICD-10-CM

## 2024-04-01 NOTE — Progress Notes (Deleted)
 [name] [facility] [address] [place] [address] Phone: [phone]   Assessment and Plan:     ***    Pertinent previous records reviewed include ***   Follow Up: ***     Subjective:   I, Dyamon Sosinski, am serving as a Neurosurgeon for Doctor Morene Mace  Chief Complaint: groin pain    HPI:   12/24/23 Patient is a 58 year old female with groin pain. Patient states groin pain started about 2 months ago. Pain starts in the groin and goes to her low back. She has pain and pressure in her groin area. Ibu for the pain does not help. No numbness or tingling. Pressure is more noticeable than the pain. Decreased ROM due to pain. Heat helps a little. No pain bearing down and or belly laugh. She isnt able to put pressure on her right side. She is afraid right leg will give out.    01/29/24 Patient states she has been in pain since the 27th of June. It has been persistent and has not left yet. Been trying to walk it out but it isn't working.  04/02/2024 Patient states   Relevant Historical Information: GERD  Additional pertinent review of systems negative.   Current Outpatient Medications:    ascorbic acid (VITAMIN C) 250 MG tablet, Take 500 mg by mouth daily., Disp: , Rfl:    atorvastatin  (LIPITOR) 20 MG tablet, TAKE 1 TABLET(20 MG) BY MOUTH DAILY, Disp: 90 tablet, Rfl: 1   Biotin 1000 MCG tablet, Take 1,000 mcg by mouth daily., Disp: , Rfl:    Calcium  Carbonate-Vitamin D  (CALCIUM  PLUS VITAMIN D  PO), Take 2 capsules by mouth daily., Disp: , Rfl:    diltiazem  (CARDIZEM  CD) 180 MG 24 hr capsule, Take 1 capsule (180 mg total) by mouth daily., Disp: 90 capsule, Rfl: 1   esomeprazole (NEXIUM) 20 MG capsule, Take 20 mg by mouth daily at 12 noon., Disp: , Rfl:    fluticasone  (FLONASE ) 50 MCG/ACT nasal spray, Place 2 sprays into both nostrils daily., Disp: 16 g, Rfl: 6   furosemide  (LASIX ) 40 MG tablet, Take 1 tablet (40 mg total) by  mouth daily., Disp: 90 tablet, Rfl: 3   gabapentin  (NEURONTIN ) 600 MG tablet, Take 600 mg by mouth at bedtime., Disp: , Rfl:    ibuprofen  (ADVIL ) 200 MG tablet, Take 600 mg by mouth every 6 (six) hours as needed., Disp: , Rfl:    lidocaine  (LIDODERM ) 5 %, Place 1 patch onto the skin daily as needed., Disp: 30 patch, Rfl: 0   lisinopril  (ZESTRIL ) 5 MG tablet, TAKE 1 TABLET(5 MG) BY MOUTH DAILY, Disp: 90 tablet, Rfl: 3   meloxicam  (MOBIC ) 15 MG tablet, Take 1 tablet (15 mg total) by mouth daily., Disp: 30 tablet, Rfl: 0   methocarbamol (ROBAXIN) 500 MG tablet, Take 500 mg by mouth as needed for muscle spasms., Disp: , Rfl:    Multiple Vitamins-Minerals (MULTIVITAMIN WITH MINERALS) tablet, Take 1 tablet by mouth daily., Disp: , Rfl:    nabumetone  (RELAFEN ) 500 MG tablet, Take 500 mg by mouth 2 (two) times daily., Disp: , Rfl:    potassium chloride  SA (KLOR-CON  M) 20 MEQ tablet, Take 1 tablet (20 mEq total) by mouth every other day., Disp: 45 tablet, Rfl: 3   sodium chloride  (OCEAN) 0.65 % SOLN nasal spray, Place 1 spray into both nostrils as needed for congestion., Disp: , Rfl:    Turmeric (QC TUMERIC COMPLEX PO), Take  by mouth daily., Disp: , Rfl:    vitamin E 400 UNIT capsule, Take 400 Units by mouth daily., Disp: , Rfl:    Objective:     There were no vitals filed for this visit.    There is no height or weight on file to calculate BMI.    Physical Exam:    ***   Electronically signed by:  Odis Mace [facility] 7:41 AM 04/01/24

## 2024-04-02 ENCOUNTER — Ambulatory Visit: Admitting: Sports Medicine

## 2024-04-10 ENCOUNTER — Encounter: Payer: Self-pay | Admitting: Cardiology

## 2024-04-13 NOTE — Progress Notes (Unsigned)
 [name] [facility] [address] [place] [address] Phone: [phone]   Assessment and Plan:     1. Primary osteoarthritis of right hip (Primary) 2. Pain of right hip -Chronic with exacerbation, subsequent visit - Recurrence of right hip pain most consistent with flare of mild hip osteoarthritis - Patient had received significant relief after intra-articular hip CSI performed on 12/24/2023, so patient elected for repeat intra-articular CSI today.  Tolerated well per note below. - Use meloxicam  15 mg daily as needed for pain.  Recommend limiting chronic NSAIDs to 1-2 doses per week to prevent long-term side effects.  -May continue topical Voltaren  gel as needed - Continue HEP as tolerated  Procedure: Ultrasound Guided Hip Acetabulofemoral Joint Injection Side: Right Diagnosis: Flare of mild osteoarthritis US  Indication:  - accuracy is paramount for diagnosis - to ensure therapeutic efficacy or procedural success - to reduce procedural risk  After explaining the procedure, viable alternatives, risks, and answering any questions, consent was given verbally. The site was cleaned with chlorhexidine prep. An ultrasound transducer was placed on the anterior thigh/hip.   The acetabular joint, labrum, and femoral shaft were identified.  The neurovascular structures were identified and an approach was found specifically avoiding these structures.  A steroid injection was performed under ultrasound guidance with sterile technique using 2ml of 1% lidocaine  without epinephrine and 40 mg of triamcinolone  (KENALOG ) 40mg /ml. This was well tolerated and resulted in  relief.  Needle was removed and dressing placed and post injection instructions were given including  a discussion of likely return of pain today after the anesthetic wears off (with the possibility of worsened pain) until the steroid starts to work in 1-3 days.   Pt was advised to call or return  to clinic if these symptoms worsen or fail to improve as anticipated. Images permanently stored.    Pertinent previous records reviewed include none   Follow Up: As needed if no improvement or worsening of symptoms.  Could consider prednisone  Dosepak versus repeat NSAID course versus advanced imaging versus repeat CSI.  If no improvement in 2 weeks, recommend patient contact clinic and we will order right hip MRI without contrast and follow-up 5 days after MRI   Subjective:   I, Chestine Reeves, am serving as a Neurosurgeon for Doctor Morene Mace  Chief Complaint: groin pain    HPI:   12/24/23 Patient is a 58 year old female with groin pain. Patient states groin pain started about 2 months ago. Pain starts in the groin and goes to her low back. She has pain and pressure in her groin area. Ibu for the pain does not help. No numbness or tingling. Pressure is more noticeable than the pain. Decreased ROM due to pain. Heat helps a little. No pain bearing down and or belly laugh. She isnt able to put pressure on her right side. She is afraid right leg will give out.    01/29/24 Patient states she has been in pain since the 27th of June. It has been persistent and has not left yet. Been trying to walk it out but it isn't working.  04/14/2024 Patient states she is okay . Hip area and glute are still painful. Heating pad helps    Relevant Historical Information: GERD  Additional pertinent review of systems negative.   Current Outpatient Medications:    ascorbic acid (VITAMIN C) 250 MG tablet, Take 500 mg by mouth daily., Disp: , Rfl:    atorvastatin  (  LIPITOR) 20 MG tablet, TAKE 1 TABLET(20 MG) BY MOUTH DAILY, Disp: 90 tablet, Rfl: 1   Biotin 1000 MCG tablet, Take 1,000 mcg by mouth daily., Disp: , Rfl:    Calcium  Carbonate-Vitamin D  (CALCIUM  PLUS VITAMIN D  PO), Take 2 capsules by mouth daily., Disp: , Rfl:    diltiazem  (CARDIZEM  CD) 180 MG 24 hr capsule, TAKE 1 CAPSULE(180 MG) BY MOUTH DAILY,  Disp: 90 capsule, Rfl: 0   esomeprazole (NEXIUM) 20 MG capsule, Take 20 mg by mouth daily at 12 noon., Disp: , Rfl:    fluticasone  (FLONASE ) 50 MCG/ACT nasal spray, Place 2 sprays into both nostrils daily., Disp: 16 g, Rfl: 6   furosemide  (LASIX ) 40 MG tablet, Take 1 tablet (40 mg total) by mouth daily., Disp: 90 tablet, Rfl: 3   gabapentin  (NEURONTIN ) 600 MG tablet, Take 600 mg by mouth at bedtime., Disp: , Rfl:    ibuprofen  (ADVIL ) 200 MG tablet, Take 600 mg by mouth every 6 (six) hours as needed., Disp: , Rfl:    lidocaine  (LIDODERM ) 5 %, Place 1 patch onto the skin daily as needed., Disp: 30 patch, Rfl: 0   lisinopril  (ZESTRIL ) 5 MG tablet, TAKE 1 TABLET(5 MG) BY MOUTH DAILY, Disp: 90 tablet, Rfl: 3   meloxicam  (MOBIC ) 15 MG tablet, Take 1 tablet (15 mg total) by mouth daily., Disp: 30 tablet, Rfl: 0   methocarbamol (ROBAXIN) 500 MG tablet, Take 500 mg by mouth as needed for muscle spasms., Disp: , Rfl:    Multiple Vitamins-Minerals (MULTIVITAMIN WITH MINERALS) tablet, Take 1 tablet by mouth daily., Disp: , Rfl:    nabumetone  (RELAFEN ) 500 MG tablet, Take 500 mg by mouth 2 (two) times daily., Disp: , Rfl:    potassium chloride  SA (KLOR-CON  M) 20 MEQ tablet, Take 1 tablet (20 mEq total) by mouth every other day., Disp: 45 tablet, Rfl: 3   sodium chloride  (OCEAN) 0.65 % SOLN nasal spray, Place 1 spray into both nostrils as needed for congestion., Disp: , Rfl:    Turmeric (QC TUMERIC COMPLEX PO), Take by mouth daily., Disp: , Rfl:    vitamin E 400 UNIT capsule, Take 400 Units by mouth daily., Disp: , Rfl:    Objective:     Vitals:   04/14/24 1344  BP: 124/74  Weight: 182 lb (82.6 kg)  Height: 5' 5 (1.651 m)      Body mass index is 30.29 kg/m.    Physical Exam:    General: awake, alert, and oriented no acute distress, nontoxic Skin: no suspicious lesions or rashes Neuro:sensation intact distally with no deficits, normal muscle tone, no atrophy, strength 5/5 in all tested lower ext  groups Psych: normal mood and affect, speech clear   Right hip: No deformity, swelling or wasting ROM Flexion 80, ext 30, IR 35, ER 45 NTTP over the hip flexors, greater trochanter, gluteal musculature, si joint, lumbar spine Positive log roll with FROM for groin pain Positive FABER for groin pain Positive FADIR for groin pain Negative Piriformis test Negative trendelenberg Gait antalgic, favoring left leg    Electronically signed by:  Odis Mace [facility] 2:02 PM 04/14/24

## 2024-04-14 ENCOUNTER — Ambulatory Visit (INDEPENDENT_AMBULATORY_CARE_PROVIDER_SITE_OTHER): Admitting: Sports Medicine

## 2024-04-14 ENCOUNTER — Other Ambulatory Visit: Payer: Self-pay

## 2024-04-14 VITALS — BP 124/74 | Ht 65.0 in | Wt 182.0 lb

## 2024-04-14 DIAGNOSIS — M25551 Pain in right hip: Secondary | ICD-10-CM

## 2024-04-14 DIAGNOSIS — M1611 Unilateral primary osteoarthritis, right hip: Secondary | ICD-10-CM

## 2024-04-14 NOTE — Patient Instructions (Signed)
 As needed follow up If no significant improvement in 2 weeks call and ask for an MRI

## 2024-04-17 ENCOUNTER — Other Ambulatory Visit

## 2024-04-23 ENCOUNTER — Other Ambulatory Visit: Payer: Self-pay | Admitting: General Practice

## 2024-04-26 ENCOUNTER — Emergency Department (HOSPITAL_COMMUNITY)

## 2024-04-26 ENCOUNTER — Emergency Department (HOSPITAL_BASED_OUTPATIENT_CLINIC_OR_DEPARTMENT_OTHER)
Admission: EM | Admit: 2024-04-26 | Discharge: 2024-04-26 | Disposition: A | Discharge status: Home / Self Care | Attending: Emergency Medicine | Admitting: Emergency Medicine

## 2024-04-26 ENCOUNTER — Emergency Department (HOSPITAL_BASED_OUTPATIENT_CLINIC_OR_DEPARTMENT_OTHER)

## 2024-04-26 ENCOUNTER — Other Ambulatory Visit: Payer: Self-pay

## 2024-04-26 DIAGNOSIS — R739 Hyperglycemia, unspecified: Secondary | ICD-10-CM | POA: Insufficient documentation

## 2024-04-26 DIAGNOSIS — R4182 Altered mental status, unspecified: Secondary | ICD-10-CM | POA: Diagnosis not present

## 2024-04-26 DIAGNOSIS — Z79899 Other long term (current) drug therapy: Secondary | ICD-10-CM | POA: Diagnosis not present

## 2024-04-26 DIAGNOSIS — R410 Disorientation, unspecified: Secondary | ICD-10-CM | POA: Insufficient documentation

## 2024-04-26 DIAGNOSIS — I10 Essential (primary) hypertension: Secondary | ICD-10-CM | POA: Insufficient documentation

## 2024-04-26 DIAGNOSIS — Z853 Personal history of malignant neoplasm of breast: Secondary | ICD-10-CM | POA: Diagnosis not present

## 2024-04-26 DIAGNOSIS — R519 Headache, unspecified: Secondary | ICD-10-CM | POA: Insufficient documentation

## 2024-04-26 DIAGNOSIS — R9082 White matter disease, unspecified: Secondary | ICD-10-CM | POA: Diagnosis not present

## 2024-04-26 DIAGNOSIS — R413 Other amnesia: Secondary | ICD-10-CM | POA: Diagnosis not present

## 2024-04-26 LAB — URINALYSIS, W/ REFLEX TO CULTURE (INFECTION SUSPECTED)
Bilirubin Urine: NEGATIVE
Glucose, UA: NEGATIVE mg/dL
Hgb urine dipstick: NEGATIVE
Ketones, ur: NEGATIVE mg/dL
Leukocytes,Ua: NEGATIVE
Nitrite: NEGATIVE
Protein, ur: NEGATIVE mg/dL
Specific Gravity, Urine: 1.01 (ref 1.005–1.030)
pH: 7 (ref 5.0–8.0)

## 2024-04-26 LAB — CBC
HCT: 40.8 % (ref 36.0–46.0)
Hemoglobin: 14.2 g/dL (ref 12.0–15.0)
MCH: 31.1 pg (ref 26.0–34.0)
MCHC: 34.8 g/dL (ref 30.0–36.0)
MCV: 89.3 fL (ref 80.0–100.0)
Platelets: 267 K/uL (ref 150–400)
RBC: 4.57 MIL/uL (ref 3.87–5.11)
RDW: 11.9 % (ref 11.5–15.5)
WBC: 8.1 K/uL (ref 4.0–10.5)
nRBC: 0 % (ref 0.0–0.2)

## 2024-04-26 LAB — COMPREHENSIVE METABOLIC PANEL WITH GFR
ALT: 16 U/L (ref 0–44)
AST: 19 U/L (ref 15–41)
Albumin: 4.7 g/dL (ref 3.5–5.0)
Alkaline Phosphatase: 87 U/L (ref 38–126)
Anion gap: 13 (ref 5–15)
BUN: 15 mg/dL (ref 6–20)
CO2: 24 mmol/L (ref 22–32)
Calcium: 10 mg/dL (ref 8.9–10.3)
Chloride: 105 mmol/L (ref 98–111)
Creatinine, Ser: 0.75 mg/dL (ref 0.44–1.00)
GFR, Estimated: >60 mL/min (ref >60)
Glucose, Bld: 122 mg/dL — ABNORMAL HIGH (ref 70–99)
Potassium: 3.2 mmol/L — ABNORMAL LOW (ref 3.5–5.1)
Sodium: 142 mmol/L (ref 135–145)
Total Bilirubin: 0.4 mg/dL (ref 0.0–1.2)
Total Protein: 7.8 g/dL (ref 6.5–8.1)

## 2024-04-26 LAB — ETHANOL: Alcohol, Ethyl (B): <15 mg/dL (ref <15)

## 2024-04-26 LAB — PROTIME-INR
INR: 0.9 (ref 0.8–1.2)
Prothrombin Time: 12.2 s (ref 11.4–15.2)

## 2024-04-26 LAB — APTT: aPTT: 24 s (ref 24–36)

## 2024-04-26 LAB — CBG MONITORING, ED: Glucose-Capillary: 124 mg/dL — ABNORMAL HIGH (ref 70–99)

## 2024-04-26 MED ORDER — SODIUM CHLORIDE 0.9 % IV BOLUS
1000.0000 mL | Freq: Once | INTRAVENOUS | Status: AC
  Administered 2024-04-26: 1000 mL via INTRAVENOUS

## 2024-04-26 MED ORDER — KETOROLAC TROMETHAMINE 15 MG/ML IJ SOLN
15.0000 mg | Freq: Once | INTRAMUSCULAR | Status: AC
  Administered 2024-04-26: 15 mg via INTRAVENOUS
  Filled 2024-04-26: qty 1

## 2024-04-26 MED ORDER — POTASSIUM CHLORIDE CRYS ER 20 MEQ PO TBCR
40.0000 meq | EXTENDED_RELEASE_TABLET | Freq: Once | ORAL | Status: AC
  Administered 2024-04-26: 40 meq via ORAL
  Filled 2024-04-26: qty 2

## 2024-04-26 MED ORDER — PROCHLORPERAZINE EDISYLATE 10 MG/2ML IJ SOLN
5.0000 mg | Freq: Once | INTRAMUSCULAR | Status: AC
  Administered 2024-04-26: 5 mg via INTRAVENOUS
  Filled 2024-04-26: qty 2

## 2024-04-26 MED ORDER — DIPHENHYDRAMINE HCL 50 MG/ML IJ SOLN
25.0000 mg | Freq: Once | INTRAMUSCULAR | Status: AC
  Administered 2024-04-26: 25 mg via INTRAVENOUS
  Filled 2024-04-26: qty 1

## 2024-04-26 MED ORDER — METOCLOPRAMIDE HCL 5 MG/ML IJ SOLN
10.0000 mg | Freq: Once | INTRAMUSCULAR | Status: AC
  Administered 2024-04-26: 10 mg via INTRAVENOUS
  Filled 2024-04-26: qty 2

## 2024-04-26 NOTE — ED Notes (Signed)
 IV saline locked and secured w/ coband

## 2024-04-26 NOTE — ED Notes (Addendum)
 Patient arrives with husband from [facility] ED; patient reports sudden onset short-term memory loss with headache. Patient is able to state name, DOB, location, and year. Vitals taken.

## 2024-04-26 NOTE — ED Provider Notes (Signed)
 Patient transferred from [facility] for MRI scan of the brain.  She presented with headache and short-term memory loss and had negative CT scan.  Plan is for discharge if MRI does not show evidence of stroke.  She is still complaining of a headache.  She had received ketorolac  and low-dose prochlorperazine and states that symptoms are improved but not quite back to normal.  I have ordered metoclopramide and diphenhydramine as well as IV fluids.  Patient is currently sleeping, IV fluids in the process of infusing.  MRI is still pending.  Case is signed out to Dr. Laurice.   Raford Lenis, MD 04/26/24 [phone]

## 2024-04-26 NOTE — ED Provider Notes (Signed)
 Patient seen after prior ED providers.  She reports that she feels much improved.  She denies any current headache.  Obtained workup results discussed extensively with the patient and her husband.  All questions answered.  Patient is comfortable with plan for discharge home.  Importance of close follow-up is stressed.  Strict return precautions given and understood.   Laurice Maude BROCKS, MD 04/26/24 [phone]

## 2024-04-26 NOTE — Discharge Instructions (Signed)
 Return for any problem.  ?

## 2024-04-26 NOTE — ED Triage Notes (Signed)
 Pt POV reporting short-term memory loss that began around 2000 after dinner, does not remember any events from tonight. Also reporting frontal headache, does not know when it started. BP 167/105 in triage, pt taking BP meds as prescribed.

## 2024-04-26 NOTE — ED Provider Notes (Signed)
 [place] EMERGENCY DEPARTMENT AT [facility] Provider Note  CSN: [id number] Arrival date & time: 04/26/24 0010  Chief Complaint(s) Altered Mental Status  HPI Olivia [name] is a 58 y.o. female with a past medical history listed below who presents to the emergency department with sudden onset memory loss and confusion around 8 PM this afternoon.  Shortly thereafter, patient complained of headache.  No recent fevers or infections.  No cough or congestion.  No nausea or vomiting.  No diarrhea.  No chest pain or shortness of breath.  No visual disturbance.  No gait instability or focal deficits.  No sensory deficits.  No prior episodes of the same in the past.  Husband describes some memory loss of short-term memory stating that she did not remember things she did earlier in the day, including forgetting that she has spoken to her mom and cousin earlier today and discussing her dad's passing year ago.  Patient denies any alcohol or illicit drug use.  No change in medications.  Patient does have a postauricular patch for vertigo that she bought from [employer].  She is unsure of what is medicated with but reports she is used this in the past without side effects.  The history is provided by the patient and the spouse.    Past Medical History Past Medical History:  Diagnosis Date   Breast cancer, stage 1 (HCC)    Colon polyp    GERD (gastroesophageal reflux disease)    H/O diastolic dysfunction 05/2013   Grade 2 DD. 1.3 x 1.3 cm mass in RA on Echo -->Cardiac MRI 12/04: 1.9 x 1.6 cm mass in the lateral RA cavity. -- Most suggestive of thrombus--  calcified and  lamina and ted suggestive of chronic.; EF from 50% with no scar.   History of breast cancer 2010   invasive ductal R breast; s/p lumpectomy, radiation and chemo   Hypertension    Lumbar degenerative disc disease    Obesity    Personal history of chemotherapy    Personal history of radiation therapy    Right atrial thrombus  05/2013   f/u Echo 09/2014: Low normal LV function; grade 2 diastolic dysfunction; calcified right atrial mass; no change compared to 06/11/13.   Uterine fibroid    s/p embolization   Patient Active Problem List   Diagnosis Date Noted   Bilateral lower extremity edema 10/29/2017   Costochondritis, acute 06/05/2017   Mass of chest wall, left 03/28/2015   Breast lump in female 03/28/2015   Personal history of malignant neoplasm of breast 03/28/2015   Hypertensive heart disease - diastolic dysfunction noted on echo 10/16/2014   SOB (shortness of breath) on exertion 10/16/2013   Palpitations 10/16/2013   Right atrial thrombus 10/16/2013   Essential hypertension, benign 06/17/2013   Abnormal echocardiogram 06/17/2013   GERD (gastroesophageal reflux disease) 06/11/2013   Generalized anxiety disorder 06/11/2013   History of right breast cancer 11/07/2011   Hyperlipidemia with target LDL less than 100 08/23/2011   Shortness of breath 05/13/2009   Home Medication(s) Prior to Admission medications   Medication Sig Start Date End Date Taking? Authorizing Provider  ascorbic acid (VITAMIN C) 250 MG tablet Take 500 mg by mouth daily.    [provider]  atorvastatin  (LIPITOR) 20 MG tablet TAKE 1 TABLET(20 MG) BY MOUTH DAILY 12/31/23   Randol Dawes, MD  Biotin 1000 MCG tablet Take 1,000 mcg by mouth daily. 03/05/12   [provider]  Calcium  Carbonate-Vitamin D  (CALCIUM   PLUS VITAMIN D  PO) Take 2 capsules by mouth daily.    [provider]  diltiazem  (CARDIZEM  CD) 180 MG 24 hr capsule TAKE 1 CAPSULE(180 MG) BY MOUTH DAILY 04/01/24   Anner Alm ORN, MD  esomeprazole (NEXIUM) 20 MG capsule Take 20 mg by mouth daily at 12 noon.    [provider]  fluticasone  (FLONASE ) 50 MCG/ACT nasal spray Place 2 sprays into both nostrils daily. 12/27/22   Randol Dawes, MD  furosemide  (LASIX ) 40 MG tablet Take 1 tablet (40 mg total) by mouth daily. 05/21/23   Anner Alm ORN, MD   gabapentin  (NEURONTIN ) 600 MG tablet Take 600 mg by mouth at bedtime.    [provider]  ibuprofen  (ADVIL ) 200 MG tablet Take 600 mg by mouth every 6 (six) hours as needed.    [provider]  lidocaine  (LIDODERM ) 5 % Place 1 patch onto the skin daily as needed. 06/13/22   Leonce Katz, DO  lisinopril  (ZESTRIL ) 5 MG tablet TAKE 1 TABLET(5 MG) BY MOUTH DAILY 04/23/24   Emelia Josefa HERO, NP  meloxicam  (MOBIC ) 15 MG tablet Take 1 tablet (15 mg total) by mouth daily. 01/29/24   Leonce Katz, DO  methocarbamol (ROBAXIN) 500 MG tablet Take 500 mg by mouth as needed for muscle spasms.    [provider]  Multiple Vitamins-Minerals (MULTIVITAMIN WITH MINERALS) tablet Take 1 tablet by mouth daily.    [provider]  nabumetone  (RELAFEN ) 500 MG tablet Take 500 mg by mouth 2 (two) times daily.    [provider]  potassium chloride  SA (KLOR-CON  M) 20 MEQ tablet Take 1 tablet (20 mEq total) by mouth every other day. 05/21/23   Anner Alm ORN, MD  sodium chloride  (OCEAN) 0.65 % SOLN nasal spray Place 1 spray into both nostrils as needed for congestion.    [provider]  Turmeric (QC TUMERIC COMPLEX PO) Take by mouth daily.    [provider]  vitamin E 400 UNIT capsule Take 400 Units by mouth daily.    [provider]                                                                                                                                    Allergies Adhesive [tape] and Morphine  Review of Systems Review of Systems As noted in HPI  Physical Exam Vital Signs  I have reviewed the triage vital signs BP (!) 158/105 (BP Location: Left Arm)   Pulse 84   Temp 98.2 F (36.8 C) (Oral)   Resp 18   Ht 5' 6 (1.676 m)   SpO2 98%   BMI 29.38 kg/m   Physical Exam Vitals reviewed.  Constitutional:      General: She is not in acute distress.    Appearance: She is well-developed. She is not diaphoretic.  HENT:      Head: Normocephalic and atraumatic.     Nose:  Nose normal.  Eyes:     General: No scleral icterus.       Right eye: No discharge.        Left eye: No discharge.     Conjunctiva/sclera: Conjunctivae normal.     Pupils: Pupils are equal, round, and reactive to light.  Cardiovascular:     Rate and Rhythm: Normal rate and regular rhythm.     Heart sounds: No murmur heard.    No friction rub. No gallop.  Pulmonary:     Effort: Pulmonary effort is normal. No respiratory distress.     Breath sounds: Normal breath sounds. No stridor. No rales.  Abdominal:     General: There is no distension.     Palpations: Abdomen is soft.     Tenderness: There is no abdominal tenderness.  Musculoskeletal:        General: No tenderness.     Cervical back: Normal range of motion and neck supple.  Skin:    General: Skin is warm and dry.     Findings: No erythema or rash.  Neurological:     Mental Status: She is alert and oriented to person, place, and time.     Comments: Mental Status:  Alert and oriented to person, place, and time.  Attention and concentration normal.  Speech clear.  Recent memory is impaired but improving  Cranial Nerves:  II Visual Fields: Intact to confrontation. Visual fields intact. III, IV, VI: Pupils equal and reactive to light and near. Full eye movement without nystagmus  V Facial Sensation: Normal. No weakness of masticatory muscles  VII: No facial weakness or asymmetry  VIII Auditory Acuity: Grossly normal  IX/X: The uvula is midline; the palate elevates symmetrically  XI: Normal sternocleidomastoid and trapezius strength  XII: The tongue is midline. No atrophy or fasciculations.   Motor System: Muscle Strength: 5/5 and symmetric in the upper and lower extremities. No pronation or drift.  Muscle Tone: Tone and muscle bulk are normal in the upper and lower extremities.  Coordination: Intact finger-to-nose, heel-to-shin. No tremor.  Sensation: Intact to light touch.   Gait: Routine gait normal.      ED Results and Treatments Labs (all labs ordered are listed, but only abnormal results are displayed) Labs Reviewed  COMPREHENSIVE METABOLIC PANEL WITH GFR - Abnormal; Notable for the following components:      Result Value   Potassium 3.2 (*)    Glucose, Bld 122 (*)    All other components within normal limits  URINALYSIS, W/ REFLEX TO CULTURE (INFECTION SUSPECTED) - Abnormal; Notable for the following components:   Color, Urine COLORLESS (*)    Bacteria, UA RARE (*)    All other components within normal limits  CBG MONITORING, ED - Abnormal; Notable for the following components:   Glucose-Capillary 124 (*)    All other components within normal limits  CBC  ETHANOL  PROTIME-INR  APTT  EKG  EKG Interpretation Date/Time:  Sunday April 26 2024 00:41:54 EDT Ventricular Rate:  97 PR Interval:  186 QRS Duration:  93 QT Interval:  361 QTC Calculation: 459 R Axis:   35  Text Interpretation: Sinus rhythm Confirmed by Trine Likes [phone]) on 04/26/2024 1:36:21 AM       Radiology CT Head Wo Contrast Result Date: 04/26/2024 CLINICAL DATA:  Short-term memory loss EXAM: CT HEAD WITHOUT CONTRAST TECHNIQUE: Contiguous axial images were obtained from the base of the skull through the vertex without intravenous contrast. RADIATION DOSE REDUCTION: This exam was performed according to the departmental dose-optimization program which includes automated exposure control, adjustment of the mA and/or kV according to patient size and/or use of iterative reconstruction technique. COMPARISON:  03/19/2024 FINDINGS: Brain: No acute intracranial abnormality. Specifically, no hemorrhage, hydrocephalus, mass lesion, acute infarction, or significant intracranial injury. Vascular: No hyperdense vessel or unexpected calcification. Skull: No acute  calvarial abnormality. Sinuses/Orbits: No acute findings Other: None IMPRESSION: No acute intracranial abnormality. Electronically Signed   By: Franky Crease M.D.   On: 04/26/2024 01:20    Medications Ordered in ED Medications  prochlorperazine (COMPAZINE) injection 5 mg (5 mg Intravenous Given 04/26/24 0118)  ketorolac  (TORADOL ) 15 MG/ML injection 15 mg (15 mg Intravenous Given 04/26/24 0332)  prochlorperazine (COMPAZINE) injection 5 mg (5 mg Intravenous Given 04/26/24 0332)   Procedures Procedures  (including critical care time) Medical Decision Making / ED Course   Medical Decision Making Amount and/or Complexity of Data Reviewed Labs: ordered. Decision-making details documented in ED Course. Radiology: ordered and independent interpretation performed. Decision-making details documented in ED Course. ECG/medicine tests: ordered and independent interpretation performed. Decision-making details documented in ED Course.  Risk Prescription drug management. Decision regarding hospitalization.    Headache with sudden onset memory loss.  Differential diagnosis considered.  Workup below. Out of the window for acute stroke protocol for TNK. No LVO findings.  Will assess for infectious etiology, electrolyte/metabolic derangements.  If this workup is reassuring, she will need MRI to rule out small stroke.  Also considering transient global amnesia, complex migraines.  Doubt seizure.  CBC without leukocytosis or anemia.  CMP with mild hypokalemia without other significant electrolyte derangements.  Mild hyperglycemia without DKA.  UA without evidence of infection.  CT head without ICH or mass effect.  Suspicion for meningitis/encephalitis is low.  Transferred to [facility] for MRI.     Final Clinical Impression(s) / ED Diagnoses Final diagnoses:  None    This chart was dictated using voice recognition software.  Despite best efforts to proofread,  errors can occur which can change  the documentation meaning.    Trine Likes Moder, MD 04/26/24 [phone]

## 2024-04-30 ENCOUNTER — Other Ambulatory Visit: Payer: Self-pay | Admitting: Obstetrics and Gynecology

## 2024-04-30 DIAGNOSIS — Z1231 Encounter for screening mammogram for malignant neoplasm of breast: Secondary | ICD-10-CM

## 2024-05-05 ENCOUNTER — Ambulatory Visit: Admitting: Neurology

## 2024-05-05 ENCOUNTER — Encounter: Payer: Self-pay | Admitting: Neurology

## 2024-05-05 VITALS — BP 131/85 | HR 68 | Ht 65.0 in | Wt 180.0 lb

## 2024-05-05 DIAGNOSIS — M542 Cervicalgia: Secondary | ICD-10-CM

## 2024-05-05 DIAGNOSIS — R0683 Snoring: Secondary | ICD-10-CM

## 2024-05-05 DIAGNOSIS — R519 Headache, unspecified: Secondary | ICD-10-CM

## 2024-05-05 DIAGNOSIS — R413 Other amnesia: Secondary | ICD-10-CM | POA: Diagnosis not present

## 2024-05-05 DIAGNOSIS — R410 Disorientation, unspecified: Secondary | ICD-10-CM

## 2024-05-05 DIAGNOSIS — Z9189 Other specified personal risk factors, not elsewhere classified: Secondary | ICD-10-CM | POA: Diagnosis not present

## 2024-05-05 DIAGNOSIS — E663 Overweight: Secondary | ICD-10-CM

## 2024-05-05 DIAGNOSIS — R351 Nocturia: Secondary | ICD-10-CM

## 2024-05-05 NOTE — Patient Instructions (Addendum)
 I am not quite sure what contributed to your headache and memory lapse episode recently.  Your neurological exam is benign today and recent imaging tests were also reassuring.  We will proceed with additional testing with an EEG (brainwave test), which we will schedule and with a sleep study which we will schedule after insurance approval.  Please talk to your primary care regarding stress and depression symptoms as stress can certainly bring on migraine or other headaches and also affect your cognitive sharpness. We will call you with the results of your tests and follow-up in this clinic accordingly. Please continue to hydrate well with water, 64 ounces per day and generally recommended, limit caffeine to up to 1 serving per day, avoid alcohol, try to get enough rest, 7 to 8 hours of sleep are generally recommended. Make sure you have an updated eye examination as strain on the eyes can also perpetuate headaches.  If you happen to have obstructive sleep apnea, even if it is mild, I will offer you treatment with a CPAP or AutoPap machine.  Untreated obstructive sleep apnea, even when mild can cause sleep disturbances, daytime sleepiness, and recurrent headaches. Certain medications been taken daily can exacerbate headaches and this includes daily pain medication such as Mobic .

## 2024-05-05 NOTE — Progress Notes (Addendum)
 Subjective:    Patient ID: Olivia [name] is a 58 y.o. female.  HPI    True Mar, MD, PhD [facility] [address] [address] [place], [place] [address]  I saw patient, Olivia [name], as a referral from the emergency room for new onset headache and memory lapse.  The patient is accompanied by her husband today.  [name] is a 58 year old female with an underlying medical history of breast cancer with status post chemoradiation and lumpectomy, history of right atrial thrombus, followed by cardiology, vertigo, reflux disease, uterine fibroid with status post embolization, degenerative disc disease with lumbar spine surgery, status post neck injections, followed by pain management for chronic neck pain, and overweight state, who reports a recent episode of lapses in her memory.  Her husband supplements her history and reports that that day she had no stressor and no obvious cause, no obvious physical signs of ailment, she just could not retain any new information and had a headache.  Headache was generalized and severe, no photophobia or nausea or vomiting.  She denies any sudden onset one-sided weakness or numbness or tingling or droopy face or slurring of speech.  She presented to the emergency room on 04/26/2024 and I reviewed the emergency room records.  She was treated symptomatically with prochlorperazine, ketorolac , metoclopramide and received potassium.  She also received Benadryl.  She had multiple scans.  She had a brain MRI without contrast on 04/26/2024 and I reviewed the results:  IMPRESSION: 1. No acute intracranial abnormality. 2. Minimal periventricular and subcortical white matter disease.    In addition, I personally and independently reviewed images through the PACS system.  She had a head CT without contrast on 04/26/2024 and I reviewed the results:   IMPRESSION: No acute intracranial abnormality.   She had bumped her head in August  2025.  She bumped it against the countertop.  No obvious laceration or loss of consciousness.  She did get checked out as an outpatient and had a head CT.  She had a head CT without contrast on 03/19/2024 and I reviewed the results:  IMPRESSION: 1.  No evidence of an acute intracranial abnormality. 2. Paranasal sinus disease at the imaged levels, as described.    She gets briefly tearful when talking about her father who died a year ago.  She is noted to snore occasionally and mildly per husband.  She has nocturia about once per average night, denies recurrent migraine headaches or morning headaches or nocturnal headaches.  She feels fairly back to baseline with the exception of a mild lingering headache.  She takes a lot of medications and is followed by pain management.  She is currently on high-dose gabapentin , Lidoderm  patch, Robaxin, nabumetone .  She takes Cardizem  per cardiology and is on cholesterol medication.  She is on lisinopril  as well.  She also takes meloxicam  daily.  She takes Lasix  every other day.  She tries to hydrate well with water.  She limits her caffeine to 1 cup of coffee in the morning.  She drinks alcohol very occasionally.  She is a non-smoker.  She does not always rest well.  Epworth sleepiness score is 2 out of 24, fatigue severity score is 43 out of 63.  I had evaluated her for dizziness about a year ago.  She has also seen ENT for this.  She had a sleep study several years ago which was negative for sleep apnea at the time.  She does endorse  stress and some depressive symptoms.  She has an appointment coming up with her PCP this week.  Previously (copied from previous notes for reference):    01/09/23: 58 year old female with an underlying medical history of hypertension, breast cancer with status post lumpectomy and chemoradiation, right atrial thrombus, uterine fibroid, lumbar degenerative disc disease with status post lumbar laminectomy and microdiscectomy, and  obesity, who reports a 3-week history of intermittent dizzy spells.  She describes different symptoms including lightheadedness, ear fullness, muffled hearing, blurry vision, spinning sensation not necessarily associated with body position changes, and head fullness.  She is sensitive to sounds at times.  No history of migraines, no nausea or vomiting, no falls.  Symptoms came on suddenly in the evening about 3 weeks ago, she was exiting a store.  She felt weaker all over, she felt numbness in both legs, she had numbness in the left face.  Her husband took her to the ER.  She has not seen ENT yet.  She denies any residual numbness, this resolved after about 30 minutes.  She has not been sleeping very well, she snores very little per husband.  She had a sleep study some years ago and was negative for sleep apnea.  She hydrates with water, drinks quite a bit of water, about 6+ bottles per day, 16.9 ounce size each.  Very little alcohol, usually less than once every 1 or 2 months.  She is a non-smoker.  She is followed by pain management for chronic back pain and is on Robaxin 500 mg daily as needed.  She usually takes it at night, on most nights.  She has been on gabapentin  600 mg at bedtime.  She has been on it for years.  She is currently not on any narcotic pain medication.  Meclizine  did not help but made her sleepy.  She has found no triggers and no alleviating factors.  She has no severe headache, no sudden onset of one-sided weakness.  Symptoms of dizziness are intermittent, can be short-lived for minutes or longer, happen nearly daily.   I reviewed your office note from 12/27/2022.  Her blood pressure was highly elevated at the time, 170/110.  She reported lightheadedness at the time as well as pressure in her head.  She was not noted to have true vertigo at the time, she felt off balance.  Meclizine  was not helping.  She was given a scopolamine  patch.  Of note, she was seen in the emergency room on 12/18/2020  for dizziness.  I reviewed the emergency room records.  She had workup including EKG, BMP, CBC, urinalysis.  She also had a head CT without contrast on 12/19/2022 and I reviewed the results: Impression: No acute intracranial abnormality.  She was given a prescription for meclizine  and a prescription for prednisone .  She was advised to follow-up with ENT if symptoms did not improve.  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  She has since then finished her prednisone  course.  She had recent blood work on 12/19/2022 as well as 12/20/2022 and I reviewed the results.  TSH was 1.13 on 12/20/2022, urinalysis showed trace ketones, trace leukocytes.  Lipid panel was benign with total cholesterol of 161, triglycerides 91, HDL 54, LDL 90, liver panel benign, alk phos was 79, AST 13, ALT 13.   She has not seen her cardiologist.  She sees cardiology once a year typically.  She has not had a recent eye examination, did not have her yearly exam last  year.  She does not have any prescription eyeglasses.   Her Past Medical History Is Significant For: Past Medical History:  Diagnosis Date   Breast cancer, stage 1 (HCC)    Colon polyp    GERD (gastroesophageal reflux disease)    H/O diastolic dysfunction 05/2013   Grade 2 DD. 1.3 x 1.3 cm mass in RA on Echo -->Cardiac MRI 12/04: 1.9 x 1.6 cm mass in the lateral RA cavity. -- Most suggestive of thrombus--  calcified and  lamina and ted suggestive of chronic.; EF from 50% with no scar.   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   Uterine fibroid    s/p embolization    Her Past Surgical History Is  Significant For: Past Surgical History:  Procedure Laterality Date   BREAST BIOPSY Right 12/31/2012   BREAST BIOPSY Right 09/06/2008   BREAST BIOPSY Right 08/11/2008   BREAST LUMPECTOMY  2010   right   Cardiac MRI  December 2014   1.9 x 1.6 cm mass in the lateral RA cavity. -- Most suggestive of thrombus--  calcified anndd  laammiinnaated suggestive of chronic.; EF from 50-4% with no scar. Trivial effusion   COLONOSCOPY  2010   LUMBAR LAMINECTOMY/DECOMPRESSION MICRODISCECTOMY  2007, 2008   Dr. Carles x 2   POWER PORT     TRANSTHORACIC ECHOCARDIOGRAM  November 2014; March 2016t   a) Mild LVH, EF of roughly 50%. Pseudo-normal LV filling (grade 3 diastolic soft. Mild LA dilation. 1.3 x 1.3 cm mass in right atrium.;; b) Low normal LV function; grade 2 diastolic dysfunction; calcified right atrial mass; no change compared to 06/11/13.   TUBAL LIGATION  2010   UTERINE FIBROID EMBOLIZATION  2008 or 2009    Her Family History Is Significant For: Family History  Problem Relation Age of Onset   Cancer Mother    Breast cancer Mother    Asthma Mother    Prostate cancer Father    Cancer Father    Heart disease Father        late 61's, stent   Diabetes Father    Congestive Heart Failure Father    Cancer Maternal Grandmother        renal cancer   Migraines Neg Hx    Dementia Neg Hx    Alzheimer's disease Neg Hx     Her Social History Is Significant For: Social History   Socioeconomic History   Marital status: Married    Spouse name: Not on file   Number of children: Not on file   Years of education: Not on file   Highest education level: Not on file  Occupational History   Not on file  Tobacco Use   Smoking status: Never   Smokeless tobacco: Never  Vaping Use   Vaping status: Never Used  Substance and Sexual Activity   Alcohol use: No    Comment: occasionally   Drug use: No   Sexual activity: Yes  Other Topics Concern   Not on file  Social History Narrative   She is  married. Has 1 son (who is married, has 3  kids).  Total of  5 grandchildren between her and her husband   She walks with a cane sometimes (when back pain flaring). She is currently disabled (back pain, neuropathy, chronic pain).   Never smoked.   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   Hopes to help her husband Advertising account planner).      Updated 11/2023   Social Drivers of Health   Financial Resource Strain: Low Risk  (12/20/2022)   Overall Financial Resource Strain (CARDIA)    Difficulty of Paying Living Expenses: Not hard at all  Food Insecurity: Low Risk  (02/15/2023)   Received from [facility]   Hunger Vital Sign    Within the past 12 months, you worried that your food would run out before you got money to buy more: Never true    Within the past 12 months, the food you bought just didn't last and you didn't have money to get more. : Never true  Transportation Needs: Not on file (02/15/2023)  Physical Activity: Not on file  Stress: No Stress Concern Present (12/20/2022)   [employer] of Occupational Health - Occupational Stress Questionnaire    Feeling of Stress : Not at all  Social Connections: Socially Integrated (12/20/2022)   Social Connection and Isolation Panel    Frequency of Communication with Friends and Family: More than three times a week    Frequency of Social Gatherings with Friends and Family: Not on file    Attends Religious Services: More than 4 times per year    Active Member of [employer] or Organizations: Yes    Attends Engineer, structural: More than 4 times per year    Marital Status: Married    Her Allergies Are:  Allergies  Allergen Reactions   Adhesive [Tape] Hives   Morphine Hives  :   Her Current Medications Are:  Outpatient Encounter Medications as of 05/05/2024  Medication Sig   ascorbic acid (VITAMIN C) 250 MG tablet  Take 500 mg by mouth daily.   atorvastatin  (LIPITOR) 20 MG tablet TAKE 1 TABLET(20 MG) BY MOUTH DAILY   Biotin 1000 MCG tablet Take 1,000 mcg by mouth daily.   Calcium  Carbonate-Vitamin D  (CALCIUM  PLUS VITAMIN D  PO) Take 2 capsules by mouth daily.   diltiazem  (CARDIZEM  CD) 180 MG 24 hr capsule TAKE 1 CAPSULE(180 MG) BY MOUTH DAILY   esomeprazole (NEXIUM) 20 MG capsule Take 20 mg by mouth daily at 12 noon.   fluticasone  (FLONASE ) 50 MCG/ACT nasal spray Place 2 sprays into both nostrils daily.   furosemide  (LASIX ) 40 MG tablet Take 1 tablet (40 mg total) by mouth daily.   gabapentin  (NEURONTIN ) 600 MG tablet Take 600 mg by mouth at bedtime.   ibuprofen  (ADVIL ) 200 MG tablet Take 600 mg by mouth every 6 (six) hours as needed.   lidocaine  (LIDODERM ) 5 % Place 1 patch onto the skin daily as needed.   lisinopril  (ZESTRIL ) 5 MG tablet TAKE 1 TABLET(5 MG) BY MOUTH DAILY   meloxicam  (MOBIC ) 15 MG tablet Take 1 tablet (15 mg total) by mouth daily.   methocarbamol (ROBAXIN) 500 MG tablet Take 500 mg by mouth as needed for muscle spasms.   Multiple Vitamins-Minerals (MULTIVITAMIN WITH MINERALS) tablet Take 1 tablet by mouth daily.   nabumetone  (RELAFEN ) 500 MG tablet Take 500 mg by mouth 2 (two) times daily.   potassium chloride  SA (KLOR-CON  M) 20 MEQ tablet  Take 1 tablet (20 mEq total) by mouth every other day.   sodium chloride  (OCEAN) 0.65 % SOLN nasal spray Place 1 spray into both nostrils as needed for congestion.   Turmeric (QC TUMERIC COMPLEX PO) Take by mouth daily.   vitamin E 400 UNIT capsule Take 400 Units by mouth daily.   No facility-administered encounter medications on file as of 05/05/2024.  :   Review of Systems:  Out of a complete 14 point review of systems, all are reviewed and negative with the exception of these symptoms as listed below:  Review of Systems  Neurological:        Pt here for Migraines  Pt states short term memory loss with migraine    ESS:2    Objective:   Neurological Exam  Physical Exam Physical Examination:   Vitals:   05/05/24 1058  BP: 131/85  Pulse: 68   General Examination: The patient is a very pleasant 58 y.o. female in no acute distress. She appears well-developed and well-nourished and well groomed.  She is intermittently tearful today.  HEENT: Normocephalic, atraumatic, pupils are equal, round and reactive to light, extraocular tracking is good without limitation to gaze excursion or nystagmus noted. No photophobia.  Funduscopic exam benign. Hearing is grossly intact.  Face is symmetric with normal facial animation and normal facial sensation. Speech is clear without dysarthria. There is no hypophonia. There is no lip, neck/head, jaw or voice tremor. Neck is supple with full range of passive and active motion. There are no carotid bruits on auscultation.  Airway/Oropharynx exam reveals: mild mouth dryness, adequate dental hygiene and moderate airway crowding. Mallampati is class III. Tongue protrudes centrally and palate elevates symmetrically.   Chest: Clear to auscultation without wheezing, rhonchi or crackles noted.  Heart: S1+S2+0, regular and normal without murmurs, rubs or gallops noted.   Abdomen: Soft, non-tender and non-distended.  Extremities: There is no pitting edema in the distal lower extremities bilaterally.   Skin: Warm and dry without trophic changes noted.   Musculoskeletal: exam reveals no obvious joint deformities.   Neurologically:  Mental status: The patient is awake, alert and oriented in all 4 spheres. Her immediate and remote memory, attention, language skills and fund of knowledge are appropriate. There is no evidence of aphasia, agnosia, apraxia or anomia. Speech is clear with normal prosody and enunciation. Thought process is linear. Mood is normal and affect is blunted.   Cranial nerves II - XII are as described above under HEENT exam.  Motor exam: Normal bulk, strength and tone is noted. There  is no obvious action or resting tremor.  No drift or rebound, no postural or intention tremor. Fine motor skills and coordination: Intact finger taps, hand movements and rapid alternating patterning with both upper extremities, normal foot taps bilaterally in the lower extremities.  Cerebellar testing: No dysmetria or intention tremor. There is no truncal or gait ataxia.  Normal finger-to-nose, normal heel-to-shin bilaterally. Sensory exam: intact to light touch in the upper and lower extremities.  Romberg negative. Reflexes 1+ throughout, toes are downgoing bilaterally. Gait, station and balance: She stands easily. No veering to one side is noted. No leaning to one side is noted. Posture is age-appropriate and stance is narrow based. Gait shows normal stride length and normal pace. No problems turning are noted.   Assessment and Plan:  In summary, KALEI MEDA is a 58 year old female with an underlying medical history of breast cancer with status post chemoradiation and lumpectomy, history of right  atrial thrombus, followed by cardiology, vertigo, reflux disease, uterine fibroid with status post embolization, degenerative disc disease with lumbar spine surgery, status post neck injections, followed by pain management for chronic neck pain, and overweight state, who presents for evaluation of an episode of headache and confusion that happened about 10 days ago.  She was in the emergency room for this.  She had workup through the ER which was benign and was treated symptomatically for a suspected migraine.  Differential diagnoses includes migraine but atypical and atypical transient global amnesia.  We talked about stress as a possible cause for headaches and other symptoms and certainly could be a contributor but may not be the sole cause.  She is advised to keep her appointment with her PCP and we will proceed with a sleep study through our office and an EEG.  I did not suggest any new medication for  her.  Headache has resolved and she does not have a prior history of migraines.  She does have a mild residual dull headache which is not alarming.  She is not up-to-date with her eye examination and is encouraged to follow-up with [facility] care.  She is advised that we will keep her posted as to her test results by phone call for now and follow-up in this clinic accordingly.  If she has obstructive sleep apnea, I will offer her treatment with AutoPap therapy.  I answered all their questions today and the patient and her husband were in agreement.  They were given detailed verbal instructions and also written instruction in her MyChart after visit summary.   I spent 45 minutes in total face-to-face time and in reviewing records during pre-charting, more than 50% of which was spent in counseling and coordination of care, reviewing test results, reviewing medications and treatment regimen and/or in discussing or reviewing the diagnosis of new onset headache, the prognosis and treatment options. Pertinent laboratory and imaging test results that were available during this visit with the patient were reviewed by me and considered in my medical decision making (see chart for details).

## 2024-05-06 NOTE — Patient Instructions (Incomplete)
  Ms. Olivia [name] , Thank you for taking time to come for your Medicare Wellness Visit. I appreciate your ongoing commitment to your health goals. Please review the following plan we discussed and let me know if I can assist you in the future.   This is a list of the screening recommended for you and due dates:  Health Maintenance  Topic Date Due   Pneumococcal Vaccine for age over 92 (1 of 2 - PCV) Never done   Hepatitis B Vaccine (1 of 3 - 19+ 3-dose series) Never done   Zoster (Shingles) Vaccine (1 of 2) Never done   DTaP/Tdap/Td vaccine (4 - Td or Tdap) 12/22/2021   Medicare Annual Wellness Visit  07/06/2023   Breast Cancer Screening  05/29/2024   COVID-19 Vaccine (3 - Moderna risk series) 05/22/2024*   Flu Shot  10/27/2024*   Pap with HPV screening  03/18/2028   Colon Cancer Screening  10/09/2028   Hepatitis C Screening  Completed   HIV Screening  Completed   HPV Vaccine  Aged Out   Meningitis B Vaccine  Aged Out  *Topic was postponed. The date shown is not the original due date.   I encourage you to get the shingles vaccine from the pharmacy. This is a series of 2 shots given 2 months apart.   Get this separate from other vaccines by 2 weeks.  We discussed the recommendation for Prevnar-20 (updated pneumonia vaccine).  Please try and get us  the dates of your last MMR and Tdap (tetanus booster).  You report you had gotten these elsewhere for school, and we need to enter these dates into you record.  Please schedule routine eye exam.  Please bring us  copies of your Living Will and Healthcare Power of Attorney once completed and notarized so that it can be scanned into your medical chart.

## 2024-05-06 NOTE — Progress Notes (Unsigned)
 No chief complaint on file.  Olivia [name] is a 58 y.o. female who presents for Medicare annual wellness visit.  She is accompanied by her husband.  She went to the ER 9/28 with sudden onset memory loss and confusion, followed by onset of headache. She had CT of head and MRI brain without acute findings.  Labs remarkable only for mild hypokalemia, K+ 3.2.  She was treated w/toradol , compazine, and later reglan, dephenhydramine, IV fluids, and she felt better. She had f/u with neuro on 05/05/24. Ddx migraine (atypical), atypical transient global amnesia. Poss stress, depression.  Planning another sleep study and EEG. She was encouraged to get an eye exam.   She has hypertension, hypertensive heart disease with diastolic dysfunction noted on echo, palpitations, and hyperlipidemia. She is under the care of Dr. Anner; last saw cardiology for visit in 04/2023. She was sent a letter 03/2024 asking to schedule f/u visit, not yet scheduled.  She remains on diltiazem , lisinopril , furosemide  and potassium. She denies chest pain, palpitations, DOE, edema.  Some edema just at the end of the day, gone by morning. Worse when standing a lot, not a significant difference whether it was the day she took the lasix  or skipped it.  Hyperlipidemia: She is tolerating atorvastatin  without side effects.  Last lipids:  Lab Results  Component Value Date   CHOL 201 (H) 12/25/2023   HDL 70 12/25/2023   LDLCALC 113 (H) 12/25/2023   TRIG 99 12/25/2023   CHOLHDL 2.9 12/25/2023    RLQ, R hip and R back pain--evaluated with CT 11/2023, notable for uterine fibroids, small umbilical hernia containing fat, L5-S1 degenerative changes, mild at other lumbar levels and lower thoracic spine. Stable dense calcification in R atrium and lateral aspect of tricuspid valve. She saw Dr. Leonce, who felt her pain was flare of degenerative changes in R femoral acetabular joint, and was treated with intra-articular hip CSI, and given HEP  for hip. She reported at last visit that pain relief was short-lived.  *** She has h/o neck and back pain, prev under care of neurosurgery, with improvement after injections.  Has some chronic LBP, unchanged.  Neck pain is tolerable.  She goes to [facility] for B12 injections, weekly.    Immunization History  Administered Date(s) Administered   Influenza-Unspecified 06/07/2005   Moderna Sars-Covid-2 Vaccination 11/14/2019, 12/12/2019   Td 03/29/1993, 04/29/2003   Tdap 12/23/2011  She declines flu shots and any additional COVID boosters States she had MMR titer and TdaP (needed for school, done sometimes after 11/2021). Last Pap smear: 02/2023, normal (Dr. Rutherford) Last mammogram: 04/2023, scheduled for next month Last colonoscopy:  09/2018 Dr. Kristie, normal. 10 yr f/u Last DEXA: 12/2013, normal Dentist: sees dentist, periodontist  Ophtho: a few years ago  *** Exercise:   Nothing regular in the last year.  Occasional walking (limited by pain currently). No weight-bearing exercise.   Hep C screen done 11/2008 Vitamin D -OH normal at 46.3 in 09/2017   Patient Care Team: Randol Dawes, MD as PCP - General (Family Medicine) Anner Alm ORN, MD as PCP - Cardiology (Cardiology) Jakie Alm SAUNDERS, MD as Consulting Physician (Gastroenterology) Fernand Evans, MD as Consulting Physician (Oncology) Letha Cancer, MD as Consulting Physician (Physical Medicine and Rehabilitation) GYN--Dr. Rutherford Sports Med--Dr. Leonce Periodontist: Dr. Benito Oncologist: Dr. Lanny Pain management--Dr. Darlis Domino: Dr. Mollie Krabbe: [facility] Care Neuro: Dr. Buck  Depression Screening:     12/25/2023    2:25 PM 07/05/2022    1:31  PM 12/18/2021   10:36 AM  Depression screen PHQ 2/9  Decreased Interest 0 0 0  Down, Depressed, Hopeless 0 0 0  PHQ - 2 Score 0 0 0       No data to display            Falls screen:     12/25/2023    2:25 PM 07/05/2022    1:31 PM 12/18/2021   10:36 AM  06/10/2014    2:22 PM  Fall Risk   Falls in the past year? 0 0 0 No   Number falls in past yr: 0 0 0   Injury with Fall? 0 0 0   Risk for fall due to : No Fall Risks No Fall Risks No Fall Risks   Follow up Falls evaluation completed Falls evaluation completed  Falls evaluation completed       Data saved with a previous flowsheet row definition     Functional Status Survey:         She does not have a living will or healthcare power of attorney.   PMH, PSH, SH and FH were reviewed and updated      ROS: The patient denies anorexia, fever, weight changes, vision changes, decreased hearing, ear pain, sore throat, breast concerns, chest pain, palpitations, dizziness, syncope, dyspnea on exertion, cough, swelling (just mild at end of day, per HPI), nausea, vomiting, diarrhea, constipation, abdominal pain, melena, hematochezia, hematuria, incontinence, dysuria, vaginal bleeding, discharge, odor or itch, genital lesions, numbness, tingling, weakness, tremor, suspicious skin lesions, depression, anxiety, abnormal bleeding/bruising, or enlarged lymph nodes.   No cycles since 2010. Mild hot flashes H/o chronic pain--neck, back, per HPI R hip pain and RLQ pain per HPI. *** Recurrent reflux symptoms if she doesn't take OTC Nexium Occasional blurry vision, worse in the morning. ***  Recent HA and memory concerns per HPI. ***    PHYSICAL EXAM:   There were no vitals taken for this visit.  Wt Readings from Last 3 Encounters:  05/05/24 180 lb (81.6 kg)  04/14/24 182 lb (82.6 kg)  03/19/24 181 lb (82.1 kg)   Well-appearing, pleasant female in no distress HEENT: conjunctiva and sclera are clear, EOMI. Neck: no lymphadenopathy or mass, no bruit Heart: regular rate and rhythm Lungs: clear bilaterally Back: no spinal or CVA tenderness. Abdomen: soft, no organomegaly or mass. Extremities: no edema.   Psych: normal mood, affect, hygiene and grooming Neuro: alert and oriented, cranial  nerves intact, normal gait.   ASSESSMENT/PLAN:  PHQ-9 (not 2) and GAD-7--concern about stress causing some of her symptoms for ER visit  Can she please get us  records of MMR and Tdap that she had to get for school (some time in 2023 or 2024--need for her records!  Has she seen Dr. Rutherford since 02/2023?  No notes received. Ensure scheduled. Ensure she schedules eye exam.  Needs to schedule cardiology f/u (should have gotten reminder letter 03/2024 to schedule, last seen 04/2023)  Discussed monthly self breast exams and yearly mammograms; at least 30 minutes of aerobic activity at least 5 days/week, weight-bearing exercise at least 2x/week; proper sunscreen use reviewed; healthy diet, including goals of calcium  and vitamin D  intake and alcohol recommendations (less than or equal to 1 drink/day) reviewed; regular seatbelt use; changing batteries in smoke detectors.  Immunization recommendations discussed--yearly flu shots recommended, declined by patient. Prevnar 20 *** Needs records from MMR and TdaP. Shingrix recommended (risks, side effects), encouraged her to get this from the pharmacy.  She is considering this. Declines further COVID vaccines. Colonoscopy recommendations reviewed, UTD, due again 09/2028  MOST form reviewed, full code, full care. She was reminded to complete living will and healthcare power of attorney, and to get us  copies once completed/notarized, to be scanned into her chart.  F/u as scheduled for CPE in June 2026, sooner prn.  Medicare Attestation I have personally reviewed: The patient's medical and social history Their use of alcohol, tobacco or illicit drugs Their current medications and supplements The patient's functional ability including ADLs,fall risks, home safety risks, cognitive, and hearing and visual impairment Diet and physical activities Evidence for depression or mood disorders   The patient's weight, height, BMI have been recorded in the chart.   I have made referrals, counseling, and provided education to the patient based on review of the above and I have provided the patient with a written personalized care plan for preventive services.       Annabelle DELENA Fetters, MD

## 2024-05-07 ENCOUNTER — Ambulatory Visit: Admitting: Family Medicine

## 2024-05-07 ENCOUNTER — Encounter: Payer: Self-pay | Admitting: Family Medicine

## 2024-05-07 VITALS — BP 120/72 | HR 72 | Ht 65.0 in | Wt 181.4 lb

## 2024-05-07 DIAGNOSIS — Z7185 Encounter for immunization safety counseling: Secondary | ICD-10-CM

## 2024-05-07 DIAGNOSIS — I10 Essential (primary) hypertension: Secondary | ICD-10-CM | POA: Diagnosis not present

## 2024-05-07 DIAGNOSIS — Z853 Personal history of malignant neoplasm of breast: Secondary | ICD-10-CM

## 2024-05-07 DIAGNOSIS — Z0000 Encounter for general adult medical examination without abnormal findings: Secondary | ICD-10-CM | POA: Diagnosis not present

## 2024-05-07 DIAGNOSIS — E785 Hyperlipidemia, unspecified: Secondary | ICD-10-CM

## 2024-05-19 ENCOUNTER — Ambulatory Visit: Admitting: *Deleted

## 2024-06-02 ENCOUNTER — Ambulatory Visit
Admission: RE | Admit: 2024-06-02 | Discharge: 2024-06-02 | Disposition: A | Source: Ambulatory Visit | Attending: Obstetrics and Gynecology | Admitting: Obstetrics and Gynecology

## 2024-06-02 DIAGNOSIS — Z1231 Encounter for screening mammogram for malignant neoplasm of breast: Secondary | ICD-10-CM

## 2024-06-05 NOTE — Progress Notes (Deleted)
 Cardiology Clinic Note   Patient Name: Olivia [name] Date of Encounter: 06/05/2024  Primary Care Provider:  Randol Dawes, MD Primary Cardiologist:  Alm Clay, MD  Patient Profile    Olivia [name] 58 year old female presents the clinic today for follow-up evaluation of her hypertension and chronic diastolic CHF.  Past Medical History    Past Medical History:  Diagnosis Date   Breast cancer, stage 1 (HCC)    Colon polyp    GERD (gastroesophageal reflux disease)    H/O diastolic dysfunction 05/2013   Grade 2 DD. 1.3 x 1.3 cm mass in RA on Echo -->Cardiac MRI 12/04: 1.9 x 1.6 cm mass in the lateral RA cavity. -- Most suggestive of thrombus--  calcified and  lamina and ted suggestive of chronic.; EF from 50% with no scar.   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   Uterine fibroid    s/p embolization   Past Surgical History:  Procedure Laterality Date   BREAST BIOPSY Right 12/31/2012   BREAST BIOPSY Right 09/06/2008   BREAST BIOPSY Right 08/11/2008   BREAST LUMPECTOMY  2010   right   Cardiac MRI  December 2014   1.9 x 1.6 cm mass in the lateral RA cavity. -- Most suggestive of thrombus--  calcified anndd  laammiinnaated suggestive of chronic.; EF from 50-4% with no scar. Trivial effusion   COLONOSCOPY  2010   LUMBAR LAMINECTOMY/DECOMPRESSION MICRODISCECTOMY  2007, 2008   Dr. Carles x 2   POWER PORT     TRANSTHORACIC ECHOCARDIOGRAM  November 2014; March 2016t   a) Mild LVH, EF of roughly 50%. Pseudo-normal LV filling (grade 3 diastolic soft. Mild LA dilation. 1.3 x 1.3 cm mass in right atrium.;; b) Low normal LV  function; grade 2 diastolic dysfunction; calcified right atrial mass; no change compared to 06/11/13.   TUBAL LIGATION  2010   UTERINE FIBROID EMBOLIZATION  2008 or 2009    Allergies  Allergies  Allergen Reactions   Adhesive [Tape] Hives   Morphine Hives    History of Present Illness    Olivia [name] has a PMH of right atrial thrombus which was associated with her PICC line.  She also has a PMH of hypertension, chronic diastolic CHF, dyspnea on exertion, and palpitations.  She was last seen by Dr. Clay on 11/23/2019.  During that time she was doing well.  She continued to have swelling in her left leg which was noted to be better.  She reported that the swelling was off and on.  She also continued to have neuropathic type pains from her back surgery.  She had been trying to increase her physical activity and have been adjusting her diet.  She was trying to lose weight slowly and had lost about 10 pounds over the previous year.  She had been going to the obesity clinic and they were helping her with her diet and help in her to become more active.  Blood pressure was well controlled at home in the 110-120 systolic BP range.  Overall she was doing well and denied symptoms of significant chest  pain and pressure at rest and with exertion.  She did note some shortness of breath with rushing upstairs or carrying heavy objects.  She denied wheezing.  She did note on and off episodes of itching sharp chest discomfort in her upper chest which was not associated with activity/exertion.  She presented to the clinic 06/19/2021 for follow-up evaluation stated she felt well.  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  She noted that the coursework was difficult and had increased her stress level.  Initially her blood pressure was 153/78 and on recheck was 118/76.  We reviewed her past medical history and history of high cholesterol.  She was no longer taking rosuvastatin  and  stated that she had been taking red yeast rice.  We reviewed the importance of high-fiber diet and physical activity.  She  had a sedentary lifestyle since August.  She was also not  eating as well as she was prior to August.  We  planned repeat lipids in February, I asked her to increase her physical activity with a goal of 150 minutes of moderate physical activity per week, asked her to increase the fiber in her diet, and planned follow-up in 12 months.  She presented to the clinic 11/30/21 for follow-up evaluation and stated she felt well.  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  She was no longer doing the architectural portion of the degree.    Her blood pressure was well controlled .  She continued to be physically active walking on her treadmill and helping care for her grandchildren.  She denies palpitations.  She was using some caffeine to help her with her schoolwork.  I gave her the sleep hygiene instructions, I asked her to increase her physical activity as tolerated and plan follow-up in 12 months.  She was seen in the emergency department on 12/19/2022 with vertigo.  She reported that she had been to her dentist to have her teeth cleaned.  She noted sudden lightheadedness which dissipated.  She then had a subsequent episode that was much worse.  She did note some blurry vision and reported numbness.  Her EKG showed normal sinus rhythm 75 bpm.  She was diagnosed with vertigo.  CBC and BMP were within normal limits.  Head CT was negative for acute abnormalities.  She received meclizine  and was instructed to follow-up as an outpatient with ENT.  She presented to the clinic 05/02/23 for follow-up evaluation and stated she continued to study interior design.  She had 4 semesters left.  She was pursuing entrepreneurship as well.  She reported that her father is in hospice.  He had been in good spirits.  Her blood pressure was some elevated initially at 140/88.  She had been out of  her medication for 2 days.  I  refilled this.  She also had some chest tightness which was stable.  It was Short-lived and not exertional.  On recheck her blood pressure was 136/78.  We reviewed her lab work.  I  planned follow-up in 12 months.  She presents to the clinic today for follow-up evaluation and states***.  Today she denies chest pain, shortness of breath, lower extremity edema, fatigue, palpitations, melena, hematuria, hemoptysis, diaphoresis, weakness, presyncope, syncope, orthopnea, and PND.   Home Medications    Prior to Admission medications   Medication Sig Start Date End Date Taking? Authorizing Provider  ascorbic acid (VITAMIN C) 250 MG tablet Take 500 mg by  mouth daily.    [provider]  Calcium  Carbonate-Vitamin D  600-200 MG-UNIT TABS Take 2 tablets by mouth daily.    [provider]  diltiazem  (CARDIZEM  CD) 180 MG 24 hr capsule TAKE 1 CAPSULE BY MOUTH DAILY 12/19/20   Anner Alm ORN, MD  esomeprazole (NEXIUM) 20 MG capsule Take 20 mg by mouth daily at 12 noon.    [provider]  furosemide  (LASIX ) 40 MG tablet Take 1 tablet (40 mg total) by mouth daily. NEED OV. 04/26/21   Anner Alm ORN, MD  gabapentin  (NEURONTIN ) 600 MG tablet Take 600 mg by mouth at bedtime.    [provider]  lidocaine  (LIDODERM ) 5 % Place 1 patch onto the skin daily. 04/30/14   [provider]  lisinopril  (ZESTRIL ) 5 MG tablet TAKE 1 TABLET(5 MG) BY MOUTH DAILY 05/15/21   Anner Alm ORN, MD  methocarbamol (ROBAXIN) 500 MG tablet Take 500 mg by mouth as needed for muscle spasms.    [provider]  Molnupiravir  200 MG CAPS Take 4 capsules (800 mg total) by mouth in the morning and at bedtime. 03/17/21   Randol Dawes, MD  Multiple Vitamins-Minerals (MULTIVITAMIN WITH MINERALS) tablet Take 1 tablet by mouth daily.    [provider]  nabumetone  (RELAFEN ) 500 MG tablet Take 500 mg by mouth 2 (two) times daily.    [provider]   potassium chloride  SA (KLOR-CON ) 20 MEQ tablet TAKE 1 TABLET BY MOUTH EVERY OTHER DAY 05/22/21   Anner Alm ORN, MD  vitamin E 400 UNIT capsule Take 400 Units by mouth daily.    [provider]    Family History    Family History  Problem Relation Age of Onset   Cancer Mother    Breast cancer Mother    Asthma Mother    Prostate cancer Father    Cancer Father    Heart disease Father        late 60's, stent   Diabetes Father    Congestive Heart Failure Father    Stroke Brother 62   Cancer Maternal Grandmother        renal cancer   Migraines Neg Hx    Dementia Neg Hx    Alzheimer's disease Neg Hx    She indicated that her mother is alive. She indicated that her father is deceased. She indicated that both of her brothers are alive. She indicated that the status of her maternal grandmother is unknown. She indicated that her son is alive. She indicated that the status of her neg hx is unknown.   Social History    Social History   Socioeconomic History   Marital status: Married    Spouse name: Not on file   Number of children: Not on file   Years of education: Not on file   Highest education level: Not on file  Occupational History   Not on file  Tobacco Use   Smoking status: Never   Smokeless tobacco: Never  Vaping Use   Vaping status: Never Used  Substance and Sexual Activity   Alcohol use: No    Comment: occasionally   Drug use: No   Sexual activity: Yes  Other Topics Concern   Not on file  Social History Narrative   She is married. Has 1 son (who is married, has 3 kids).  Total of  5 grandchildren between her and her husband   She walks with a cane sometimes (when back pain flaring). She is currently  disabled (back pain, neuropathy, chronic pain).   Never smoked.   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    Hopes to help her husband advertising account planner).      Updated 04/2024   Social Drivers of Health   Financial Resource Strain: Low Risk  (05/07/2024)   Overall Financial Resource Strain (CARDIA)    Difficulty of Paying Living Expenses: Not very hard  Food Insecurity: No Food Insecurity (05/07/2024)   Hunger Vital Sign    Worried About Running Out of Food in the Last Year: Never true    Ran Out of Food in the Last Year: Never true  Transportation Needs: No Transportation Needs (05/07/2024)   PRAPARE - Administrator, Civil Service (Medical): No    Lack of Transportation (Non-Medical): No  Physical Activity: Inactive (05/07/2024)   Exercise Vital Sign    Days of Exercise per Week: 0 days    Minutes of Exercise per Session: 0 min  Stress: Stress Concern Present (05/07/2024)   [employer] of Occupational Health - Occupational Stress Questionnaire    Feeling of Stress: Rather much  Social Connections: Unknown (05/07/2024)   Social Connection and Isolation Panel    Frequency of Communication with Friends and Family: Twice a week    Frequency of Social Gatherings with Friends and Family: Patient declined    Attends Religious Services: More than 4 times per year    Active Member of [employer] or Organizations: Yes    Attends Engineer, Structural: More than 4 times per year    Marital Status: Married  Catering Manager Violence: Not At Risk (05/07/2024)   Humiliation, Afraid, Rape, and Kick questionnaire    Fear of Current or Ex-Partner: No    Emotionally Abused: No    Physically Abused: No    Sexually Abused: No     Review of Systems    General:  No chills, fever, night sweats or weight changes.  Cardiovascular:  No chest pain, dyspnea on exertion, edema, orthopnea, palpitations, paroxysmal nocturnal dyspnea. Dermatological: No rash, lesions/masses Respiratory: No cough, dyspnea Urologic: No hematuria, dysuria Abdominal:   No nausea, vomiting, diarrhea, bright red blood per  rectum, melena, or hematemesis Neurologic:  No visual changes, wkns, changes in mental status. All other systems reviewed and are otherwise negative except as noted above.  Physical Exam    VS:  There were no vitals taken for this visit. , BMI There is no height or weight on file to calculate BMI. GEN: Well nourished, well developed, in no acute distress. HEENT: normal. Neck: Supple, no JVD, carotid bruits, or masses. Cardiac: RRR, no murmurs, rubs, or gallops. No clubbing, cyanosis, generalized right ankle edema.  Radials/DP/PT 2+ and equal bilaterally.  Respiratory:  Respirations regular and unlabored, clear to auscultation bilaterally. GI: Soft, nontender, nondistended, BS + x 4. MS: no deformity or atrophy. Skin: warm and dry, no rash. Neuro:  Strength and sensation are intact. Psych: Normal affect.  Accessory Clinical Findings    Recent Labs: 04/26/2024: ALT 16; BUN 15; Creatinine, Ser 0.75; Hemoglobin 14.2; Platelets 267; Potassium 3.2; Sodium 142   Recent Lipid Panel    Component Value Date/Time   CHOL 201 (H) 12/25/2023 1015   TRIG 99 12/25/2023 1015   HDL 70 12/25/2023 1015   CHOLHDL 2.9 12/25/2023 1015   CHOLHDL 5.7 08/22/2011 0000   VLDL 34 08/22/2011 0000  LDLCALC 113 (H) 12/25/2023 1015    ECG personally reviewed by me today-EKG Interpretation Date/Time:    Ventricular Rate:    PR Interval:    QRS Duration:    QT Interval:    QTC Calculation:   R Axis:      Text Interpretation:     EKG 11/30/2021 normal sinus rhythm with sinus arrhythmia 73 bpm no ST or T wave deviation-no acute changes.  normal sinus rhythm nonspecific T wave abnormality 80 bpm- No acute changes  Echocardiogram 10/19/2014  Study Conclusions   - Left ventricle: The cavity size was normal. Wall thickness was    normal. Systolic function was normal. The estimated ejection    fraction was in the range of 50% to 55%. Wall motion was normal;    there were no regional wall motion  abnormalities. Features are    consistent with a pseudonormal left ventricular filling pattern,    with concomitant abnormal relaxation and increased filling    pressure (grade 2 diastolic dysfunction).   Impressions:   - Low normal LV function; grade 2 diastolic dysfunction; calcified    right atrial mass; no change compared to 06/11/13.  Assessment & Plan   1.Palpitations-heart rate today***.  EKG shows***.  Denies recent episodes of palpitations. Continue diltiazem  Heart healthy low-sodium diet Maintain physical activity Avoid triggers- caffeine, chocolate, EtOH, dehydration etc.-reviewed Maintain p.o. hydration  Diastolic CHF/history of right atrial thrombus-weight today***.  She is euvolemic.  Denies increased DOE or activity intolerance.  Continue furosemide , potassium, lisinopril  Daily weights Heart healthy low-sodium diet  Essential hypertension-BP today 136***/78. Maintain blood pressure log Continue diltiazem , lisinopril , furosemide , potassium  Dyspnea on exertion-denies increased work of breathing.  Able to perform all of daily activities.  Remains stable.   Heart healthy low-sodium diet Continue weight loss Repeat echocardiogram when clinically indicated.   Hyperlipidemia- Goal less than 100. Continue red yeast rice, high-fiber diet Follows with PCP   Disposition: Follow-up with Dr. Anner or me in 12 months.  Josefa HERO. Idamae Coccia NP-C    06/05/2024, 7:35 AM [facility] [address] Office [phone] Fax [phone]  Notice: This dictation was prepared with Dragon dictation along with smaller phrase technology. Any transcriptional errors that result from this process are unintentional and may not be corrected upon review.  I spent 13*** minutes examining this patient, reviewing medications, and using patient centered shared decision making involving her cardiac care.  Prior to her visit I spent greater than 20  minutes reviewing her past medical history,  medications, and prior cardiac tests.

## 2024-06-08 ENCOUNTER — Ambulatory Visit: Admitting: General Practice

## 2024-06-16 ENCOUNTER — Ambulatory Visit: Admitting: Neurology

## 2024-06-16 DIAGNOSIS — R4182 Altered mental status, unspecified: Secondary | ICD-10-CM

## 2024-06-16 DIAGNOSIS — R519 Headache, unspecified: Secondary | ICD-10-CM

## 2024-06-16 DIAGNOSIS — E663 Overweight: Secondary | ICD-10-CM

## 2024-06-16 DIAGNOSIS — R413 Other amnesia: Secondary | ICD-10-CM

## 2024-06-16 DIAGNOSIS — M542 Cervicalgia: Secondary | ICD-10-CM

## 2024-06-16 DIAGNOSIS — Z9189 Other specified personal risk factors, not elsewhere classified: Secondary | ICD-10-CM

## 2024-06-16 DIAGNOSIS — R351 Nocturia: Secondary | ICD-10-CM

## 2024-06-16 DIAGNOSIS — R410 Disorientation, unspecified: Secondary | ICD-10-CM

## 2024-06-16 DIAGNOSIS — R0683 Snoring: Secondary | ICD-10-CM

## 2024-06-17 NOTE — Procedures (Signed)
   History:  58 year old woman with cognitive changes   EEG classification:  Awake and asleep  Duration: 26 minutes   Technical aspects: This EEG study was done with scalp electrodes positioned according to the 10-20 International system of electrode placement. Electrical activity was reviewed with band pass filter of 1-70Hz , sensitivity of 7 uV/mm, display speed of 48mm/sec with a 60Hz  notched filter applied as appropriate. EEG data were recorded continuously and digitally stored.   Description of the recording: The background rhythms of this recording consists of a fairly well modulated medium amplitude background activity of 9 Hz. As the record progresses, the patient initially is in the waking state, but appears to enter the early stage II sleep during the recording, with rudimentary sleep spindles and vertex sharp wave activity seen. During the wakeful state, photic stimulation was performed, and no abnormal responses were seen. Hyperventilation was also performed, no abnormal response seen. No epileptiform discharges seen during this recording. There was bitemporal independent focal slowing.   Abnormality: Independent bitemporal focal slowing   Impression: This EEG is suggestive of neuronal dysfunction in both right and left temporal regions. No seizure or epileptiform discharges captured during this recording.    Shikha Bibb, MD [facility]

## 2024-06-18 ENCOUNTER — Telehealth: Payer: Self-pay | Admitting: Neurology

## 2024-06-18 ENCOUNTER — Ambulatory Visit: Payer: Self-pay | Admitting: Neurology

## 2024-06-18 NOTE — Telephone Encounter (Signed)
 Relayed results of the EEG as per listed.  Stating that per Dr. Buck she thought the medication side effects more likely the cause of the slower brain waves.  She has not scheduled or cancelled the sleep study. She said that she was thinking about it.  I offered to have the sleep lab call her, she said she was still thinking about it.  She appreciated call back.

## 2024-06-18 NOTE — Telephone Encounter (Signed)
-----   Message from True Mar sent at 06/18/2024 11:56 AM EST ----- Please advise patient or her husband regarding her recent EEG (i.e. brainwave test through our office).  It shows nonspecific changes including slower brain waves in the temporal areas which is  nonspecific finding and can be seen in a variety of issues including sleepiness, sedation, dementia, medication side effects (which I am suspecting is the more likely cause in her case.). I recommend we proceed with the sleep study as discussed.  It looks like she canceled a sleep study but has not rescheduled it.  Please encourage her to call our sleep lab to reschedule her sleep  study. ----- Message ----- From: Camara, Amadou, MD Sent: 06/17/2024  12:51 PM EST To: True Mar, MD

## 2024-06-18 NOTE — Telephone Encounter (Signed)
 06/02/24 Pt chooses not to do NPSG at this time [place] 05/19/24 [employer] medicare no auth req via website EE

## 2024-07-02 ENCOUNTER — Other Ambulatory Visit: Admitting: *Deleted

## 2024-07-04 ENCOUNTER — Other Ambulatory Visit: Payer: Self-pay | Admitting: Cardiology

## 2024-07-04 DIAGNOSIS — R002 Palpitations: Secondary | ICD-10-CM

## 2024-07-04 DIAGNOSIS — I5030 Unspecified diastolic (congestive) heart failure: Secondary | ICD-10-CM

## 2024-07-04 DIAGNOSIS — I110 Hypertensive heart disease with heart failure: Secondary | ICD-10-CM

## 2024-07-04 DIAGNOSIS — E785 Hyperlipidemia, unspecified: Secondary | ICD-10-CM

## 2024-07-04 DIAGNOSIS — R0602 Shortness of breath: Secondary | ICD-10-CM

## 2024-07-08 MED ORDER — POTASSIUM CHLORIDE CRYS ER 20 MEQ PO TBCR: 20.0000 meq | EXTENDED_RELEASE_TABLET | ORAL | 0 refills | Status: DC

## 2024-07-14 NOTE — Progress Notes (Unsigned)
 Cardiology Clinic Note   Patient Name: Olivia [name] Date of Encounter: 07/14/2024  Primary Care Provider:  Randol Dawes, MD Primary Cardiologist:  Alm Clay, MD  Patient Profile    Olivia [name] 58 year old female presents the clinic today for follow-up evaluation of her hypertension and chronic diastolic CHF.  Past Medical History    Past Medical History:  Diagnosis Date   Breast cancer, stage 1 (HCC)    Colon polyp    GERD (gastroesophageal reflux disease)    H/O diastolic dysfunction 05/2013   Grade 2 DD. 1.3 x 1.3 cm mass in RA on Echo -->Cardiac MRI 12/04: 1.9 x 1.6 cm mass in the lateral RA cavity. -- Most suggestive of thrombus--  calcified and  lamina and ted suggestive of chronic.; EF from 50% with no scar.   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   Uterine fibroid    s/p embolization   Past Surgical History:  Procedure Laterality Date   BREAST BIOPSY Right 12/31/2012   BREAST BIOPSY Right 09/06/2008   BREAST BIOPSY Right 08/11/2008   BREAST LUMPECTOMY  2010   right   Cardiac MRI  December 2014   1.9 x 1.6 cm mass in the lateral RA cavity. -- Most suggestive of thrombus--  calcified anndd  laammiinnaated suggestive of chronic.; EF from 50-4% with no scar. Trivial effusion   COLONOSCOPY  2010   LUMBAR LAMINECTOMY/DECOMPRESSION MICRODISCECTOMY  2007, 2008   Dr. Carles x 2   POWER PORT     TRANSTHORACIC ECHOCARDIOGRAM  November 2014; March 2016t   a) Mild LVH, EF of roughly 50%. Pseudo-normal LV filling (grade 3 diastolic soft. Mild LA dilation. 1.3 x 1.3 cm mass in right atrium.;; b) Low normal LV  function; grade 2 diastolic dysfunction; calcified right atrial mass; no change compared to 06/11/13.   TUBAL LIGATION  2010   UTERINE FIBROID EMBOLIZATION  2008 or 2009    Allergies  Allergies  Allergen Reactions   Adhesive [Tape] Hives   Morphine Hives    History of Present Illness    Olivia [name] has a PMH of right atrial thrombus which was associated with her PICC line.  She also has a PMH of hypertension, chronic diastolic CHF, dyspnea on exertion, and palpitations.  She was last seen by Dr. Clay on 11/23/2019.  During that time she was doing well.  She continued to have swelling in her left leg which was noted to be better.  She reported that the swelling was off and on.  She also continued to have neuropathic type pains from her back surgery.  She had been trying to increase her physical activity and have been adjusting her diet.  She was trying to lose weight slowly and had lost about 10 pounds over the previous year.  She had been going to the obesity clinic and they were helping her with her diet and help in her to become more active.  Blood pressure was well controlled at home in the 110-120 systolic BP range.  Overall she was doing well and denied symptoms of significant chest  pain and pressure at rest and with exertion.  She did note some shortness of breath with rushing upstairs or carrying heavy objects.  She denied wheezing.  She did note on and off episodes of itching sharp chest discomfort in her upper chest which was not associated with activity/exertion.  She presented to the clinic 06/19/2021 for follow-up evaluation stated she felt well.  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  She noted that the coursework was difficult and had increased her stress level.  Initially her blood pressure was 153/78 and on recheck was 118/76.  We reviewed her past medical history and history of high cholesterol.  She was no longer taking rosuvastatin  and  stated that she had been taking red yeast rice.  We reviewed the importance of high-fiber diet and physical activity.  She  had a sedentary lifestyle since August.  She was also not  eating as well as she was prior to August.  We  planned repeat lipids in February, I asked her to increase her physical activity with a goal of 150 minutes of moderate physical activity per week, asked her to increase the fiber in her diet, and planned follow-up in 12 months.  She presented to the clinic 11/30/21 for follow-up evaluation and stated she felt well.  She continued to study at [employer] for her interior design program.  She was no longer doing the architectural portion of the degree.    Her blood pressure was well controlled .  She continued to be physically active walking on her treadmill and helping care for her grandchildren.  She denies palpitations.  She was using some caffeine to help her with her schoolwork.  I gave her the sleep hygiene instructions, I asked her to increase her physical activity as tolerated and plan follow-up in 12 months.  She was seen in the emergency department on 12/19/2022 with vertigo.  She reported that she had been to her dentist to have her teeth cleaned.  She noted sudden lightheadedness which dissipated.  She then had a subsequent episode that was much worse.  She did note some blurry vision and reported numbness.  Her EKG showed normal sinus rhythm 75 bpm.  She was diagnosed with vertigo.  CBC and BMP were within normal limits.  Head CT was negative for acute abnormalities.  She received meclizine  and was instructed to follow-up as an outpatient with ENT.  She presented to the clinic 05/02/23 for follow-up evaluation and stated she continued to study interior design.  She had 4 semesters left.  She was pursuing entrepreneurship as well.  She reported that her father is in hospice.  He had been in good spirits.  Her blood pressure was some elevated initially at 140/88.  She had been out of  her medication for 2 days.  I  refilled this.  She also had some chest tightness which was stable.  It was Short-lived and not exertional.  On recheck her blood pressure was 136/78.  We reviewed her lab work.  I  planned follow-up in 12 months.   She presents to the clinic today for follow-up evaluation and states***.   Today she denies chest pain, shortness of breath, lower extremity edema, fatigue, palpitations, melena, hematuria, hemoptysis, diaphoresis, weakness, presyncope, syncope, orthopnea, and PND.   Home Medications    Prior to Admission medications   Medication Sig Start Date End Date Taking? Authorizing Provider  ascorbic acid (VITAMIN C) 250 MG tablet Take 500  mg by mouth daily.    [provider]  Calcium  Carbonate-Vitamin D  600-200 MG-UNIT TABS Take 2 tablets by mouth daily.    [provider]  diltiazem  (CARDIZEM  CD) 180 MG 24 hr capsule TAKE 1 CAPSULE BY MOUTH DAILY 12/19/20   Anner Alm ORN, MD  esomeprazole (NEXIUM) 20 MG capsule Take 20 mg by mouth daily at 12 noon.    [provider]  furosemide  (LASIX ) 40 MG tablet Take 1 tablet (40 mg total) by mouth daily. NEED OV. 04/26/21   Anner Alm ORN, MD  gabapentin  (NEURONTIN ) 600 MG tablet Take 600 mg by mouth at bedtime.    [provider]  lidocaine  (LIDODERM ) 5 % Place 1 patch onto the skin daily. 04/30/14   [provider]  lisinopril  (ZESTRIL ) 5 MG tablet TAKE 1 TABLET(5 MG) BY MOUTH DAILY 05/15/21   Anner Alm ORN, MD  methocarbamol (ROBAXIN) 500 MG tablet Take 500 mg by mouth as needed for muscle spasms.    [provider]  Molnupiravir  200 MG CAPS Take 4 capsules (800 mg total) by mouth in the morning and at bedtime. 03/17/21   Randol Dawes, MD  Multiple Vitamins-Minerals (MULTIVITAMIN WITH MINERALS) tablet Take 1 tablet by mouth daily.    [provider]  nabumetone  (RELAFEN ) 500 MG tablet Take 500 mg by mouth 2 (two) times daily.    [provider]  potassium chloride  SA (KLOR-CON ) 20 MEQ tablet TAKE 1 TABLET BY MOUTH EVERY OTHER DAY 05/22/21   Anner Alm ORN, MD  vitamin E 400 UNIT capsule Take 400 Units by mouth daily.    [provider]    Family History    Family History  Problem Relation Age of Onset   Cancer Mother    Breast cancer Mother    Asthma Mother    Prostate cancer Father    Cancer Father    Heart disease Father        late 24's, stent   Diabetes Father    Congestive Heart Failure Father    Stroke Brother 11   Cancer Maternal Grandmother        renal cancer   Migraines Neg Hx    Dementia Neg Hx    Alzheimer's disease Neg Hx    She indicated that her mother is alive. She indicated that her father is deceased. She indicated that both of her brothers are alive. She indicated that the status of her maternal grandmother is unknown. She indicated that her son is alive. She indicated that the status of her neg hx is unknown.   Social History    Social History   Socioeconomic History   Marital status: Married    Spouse name: Not on file   Number of children: Not on file   Years of education: Not on file   Highest education level: Not on file  Occupational History   Not on file  Tobacco Use   Smoking status: Never   Smokeless tobacco: Never  Vaping Use   Vaping status: Never Used  Substance and Sexual Activity   Alcohol use: No    Comment: occasionally   Drug use: No   Sexual activity: Yes  Other Topics Concern   Not on file  Social History Narrative   She is married. Has 1 son (who is married, has 3 kids).  Total of  5 grandchildren between her and her husband   She walks with a cane sometimes (when back pain flaring). She  is currently disabled (back pain, neuropathy, chronic pain).   Never smoked.   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    Hopes to help her husband advertising account planner).      Updated 04/2024   Social Drivers of Health   Tobacco Use: Low Risk (05/07/2024)   Patient History    Smoking Tobacco Use: Never    Smokeless Tobacco Use: Never    Passive Exposure: Not on file  Financial Resource Strain: Low Risk (05/07/2024)   Overall Financial Resource Strain (CARDIA)    Difficulty of Paying Living Expenses: Not very hard  Food Insecurity: No Food Insecurity (05/07/2024)   Epic    Worried About Programme Researcher, Broadcasting/film/video in the Last Year: Never true    Ran Out of Food in the Last Year: Never true  Transportation Needs: No Transportation Needs (05/07/2024)   Epic    Lack of Transportation (Medical): No    Lack of Transportation (Non-Medical): No  Physical Activity: Inactive (05/07/2024)   Exercise Vital Sign    Days of Exercise per Week: 0 days    Minutes of Exercise per Session: 0 min  Stress: Stress Concern Present (05/07/2024)   [employer] of Occupational Health - Occupational Stress Questionnaire    Feeling of Stress: Rather much  Social Connections: Unknown (05/07/2024)   Social Connection and Isolation Panel    Frequency of Communication with Friends and Family: Twice a week    Frequency of Social Gatherings with Friends and Family: Patient declined    Attends Religious Services: More than 4 times per year    Active Member of [employer] or Organizations: Yes    Attends Engineer, Structural: More than 4 times per year    Marital Status: Married  Catering Manager Violence: Not At Risk (05/07/2024)   Epic    Fear of Current or Ex-Partner: No    Emotionally Abused: No    Physically Abused: No    Sexually Abused: No  Depression (PHQ2-9): Medium Risk (05/07/2024)   Depression (PHQ2-9)    PHQ-2 Score: 5  Alcohol Screen: Not on file  Housing: Low Risk (05/07/2024)   Epic    Unable to Pay for Housing in the Last Year: No    Number of Times Moved in the Last Year: 0    Homeless in the Last Year: No  Utilities: Not  At Risk (05/07/2024)   Epic    Threatened with loss of utilities: No  Health Literacy: Not on file     Review of Systems    General:  No chills, fever, night sweats or weight changes.  Cardiovascular:  No chest pain, dyspnea on exertion, edema, orthopnea, palpitations, paroxysmal nocturnal dyspnea. Dermatological: No rash, lesions/masses Respiratory: No cough, dyspnea Urologic: No hematuria, dysuria Abdominal:   No nausea, vomiting, diarrhea, bright red blood per rectum, melena, or hematemesis Neurologic:  No visual changes, wkns, changes in mental status. All other systems reviewed and are otherwise negative except as noted above.  Physical Exam    VS:  There were no vitals taken for this visit. , BMI There is no height or weight on file to calculate BMI. GEN: Well nourished, well developed, in no acute distress. HEENT: normal. Neck: Supple, no JVD, carotid bruits, or masses. Cardiac: RRR, no murmurs, rubs, or gallops. No clubbing, cyanosis, generalized right ankle edema.  Radials/DP/PT 2+ and equal bilaterally.  Respiratory:  Respirations regular and unlabored, clear to auscultation bilaterally. GI: Soft, nontender, nondistended, BS + x 4. MS: no deformity or atrophy. Skin: warm and dry, no rash. Neuro:  Strength and sensation are intact. Psych: Normal affect.  Accessory Clinical Findings    Recent Labs: 04/26/2024: ALT 16; BUN 15; Creatinine, Ser 0.75; Hemoglobin 14.2; Platelets 267; Potassium 3.2; Sodium 142   Recent Lipid Panel    Component Value Date/Time   CHOL 201 (H) 12/25/2023 1015   TRIG 99 12/25/2023 1015   HDL 70 12/25/2023 1015   CHOLHDL 2.9 12/25/2023 1015   CHOLHDL 5.7 08/22/2011 0000   VLDL 34 08/22/2011 0000   LDLCALC 113 (H) 12/25/2023 1015    ECG personally reviewed by me today-EKG Interpretation Date/Time:    Ventricular Rate:    PR Interval:    QRS Duration:    QT Interval:    QTC Calculation:   R Axis:      Text Interpretation:     EKG  11/30/2021 normal sinus rhythm with sinus arrhythmia 73 bpm no ST or T wave deviation-no acute changes.  normal sinus rhythm nonspecific T wave abnormality 80 bpm- No acute changes  Echocardiogram 10/19/2014  Study Conclusions   - Left ventricle: The cavity size was normal. Wall thickness was    normal. Systolic function was normal. The estimated ejection    fraction was in the range of 50% to 55%. Wall motion was normal;    there were no regional wall motion abnormalities. Features are    consistent with a pseudonormal left ventricular filling pattern,    with concomitant abnormal relaxation and increased filling    pressure (grade 2 diastolic dysfunction).   Impressions:   - Low normal LV function; grade 2 diastolic dysfunction; calcified    right atrial mass; no change compared to 06/11/13.  Assessment & Plan   1. Palpitations-heart rate today***.  EKG shows***.  Denies recent episodes of palpitations. Continue diltiazem  Heart healthy low-sodium diet Maintain physical activity Avoid triggers- caffeine, chocolate, EtOH, dehydration etc.-reviewed Maintain p.o. hydration   Diastolic CHF/history of right atrial thrombus-weight today***.  She is euvolemic.  Denies increased DOE or activity intolerance.  Continue furosemide , potassium, lisinopril  Daily weights Heart healthy low-sodium diet   Essential hypertension-BP today 136***/78. Maintain blood pressure log Continue diltiazem , lisinopril , furosemide , potassium   Dyspnea on exertion-denies increased work of breathing.  Able to perform all of daily activities.  Remains stable.   Heart healthy low-sodium diet Continue weight loss Repeat echocardiogram when clinically indicated.     Hyperlipidemia- Goal less than 100. Continue red yeast rice, high-fiber diet Follows with PCP     Disposition: Follow-up with Dr. Anner or me in 12 months.   Olivia [name]. Lynnix Schoneman NP-C    07/14/2024, 8:44 AM [facility]  [facility] [address] Office [phone] Fax [phone]  Notice: This dictation was prepared with Dragon dictation along with smaller phrase technology. Any transcriptional errors that result from this process are unintentional and may not be corrected upon review.  I spent 13 minutes examining this patient, reviewing medications, and using patient centered shared decision making involving her cardiac care.  Prior to her visit I spent greater than 20 minutes reviewing her past medical history,  medications, and prior cardiac tests.

## 2024-07-17 ENCOUNTER — Ambulatory Visit: Attending: General Practice | Admitting: General Practice

## 2024-07-17 ENCOUNTER — Encounter: Payer: Self-pay | Admitting: General Practice

## 2024-07-17 DIAGNOSIS — R002 Palpitations: Secondary | ICD-10-CM | POA: Diagnosis not present

## 2024-07-17 DIAGNOSIS — I5032 Chronic diastolic (congestive) heart failure: Secondary | ICD-10-CM

## 2024-07-17 DIAGNOSIS — I110 Hypertensive heart disease with heart failure: Secondary | ICD-10-CM | POA: Diagnosis not present

## 2024-07-17 DIAGNOSIS — I5030 Unspecified diastolic (congestive) heart failure: Secondary | ICD-10-CM

## 2024-07-17 DIAGNOSIS — E785 Hyperlipidemia, unspecified: Secondary | ICD-10-CM

## 2024-07-17 DIAGNOSIS — R0602 Shortness of breath: Secondary | ICD-10-CM

## 2024-07-17 MED ORDER — LISINOPRIL 10 MG PO TABS: 10.0000 mg | ORAL_TABLET | Freq: Every day | ORAL | 1 refills | Status: AC

## 2024-07-17 MED ORDER — DILTIAZEM HCL ER COATED BEADS 180 MG PO CP24: 180.0000 mg | ORAL_CAPSULE | Freq: Every day | ORAL | 2 refills | Status: DC

## 2024-07-17 MED ORDER — ATORVASTATIN CALCIUM 20 MG PO TABS: 30.0000 mg | ORAL_TABLET | Freq: Every day | ORAL | 1 refills | Status: AC

## 2024-07-17 NOTE — Patient Instructions (Addendum)
 Medication Instructions:  INCREASE Lisinopril  10 mg Take 1 tablet daily   INCREASE Atorvastatin  30 mg Take 1.5 tablet daily (tablet comes in a 20 mg tablet so you would take a tablet and a  half)  *If you need a refill on your cardiac medications before your next appointment, please call your pharmacy*  Lab Work: Fasting Lipid Panel and LFT in 6 weeks If you have labs (blood work) drawn today and your tests are completely normal, you will receive your results only by: MyChart Message (if you have MyChart) OR A paper copy in the mail If you have any lab test that is abnormal or we need to change your treatment, we will call you to review the results.  Testing/Procedures: NONE ORDERED  Follow-Up: At [facility], you and your health needs are our priority.  As part of our continuing mission to provide you with exceptional heart care, our providers are all part of one team.  This team includes your primary Cardiologist (physician) and Advanced Practice Providers or APPs (Physician Assistants and Nurse Practitioners) who all work together to provide you with the care you need, when you need it.  Your next appointment:   4 week(s)  Provider:   Josefa Beauvais, NP   We recommend signing up for the patient portal called MyChart.  Sign up information is provided on this After Visit Summary.  MyChart is used to connect with patients for Virtual Visits (Telemedicine).  Patients are able to view lab/test results, encounter notes, upcoming appointments, etc.  Non-urgent messages can be sent to your provider as well.   To learn more about what you can do with MyChart, go to [email or web address].   Other Instructions   [facility]:

## 2024-08-05 ENCOUNTER — Other Ambulatory Visit: Payer: Self-pay | Admitting: General Practice

## 2024-08-07 ENCOUNTER — Other Ambulatory Visit: Payer: Self-pay | Admitting: Cardiology

## 2024-08-07 DIAGNOSIS — I5030 Unspecified diastolic (congestive) heart failure: Secondary | ICD-10-CM

## 2024-08-07 DIAGNOSIS — I110 Hypertensive heart disease with heart failure: Secondary | ICD-10-CM

## 2024-08-07 DIAGNOSIS — E785 Hyperlipidemia, unspecified: Secondary | ICD-10-CM

## 2024-08-07 DIAGNOSIS — R002 Palpitations: Secondary | ICD-10-CM

## 2024-08-07 DIAGNOSIS — R0602 Shortness of breath: Secondary | ICD-10-CM

## 2024-08-12 NOTE — Progress Notes (Unsigned)
 "  Cardiology Clinic Note   Patient Name: Olivia [name] Date of Encounter: 08/12/2024  Primary Care Provider:  Randol Dawes, MD Primary Cardiologist:  Alm Clay, MD  Patient Profile    Olivia [name] 59 year old female presents the clinic today for follow-up evaluation of her hypertension and chronic diastolic CHF.  Past Medical History    Past Medical History:  Diagnosis Date   Breast cancer, stage 1 (HCC)    Colon polyp    GERD (gastroesophageal reflux disease)    H/O diastolic dysfunction 05/2013   Grade 2 DD. 1.3 x 1.3 cm mass in RA on Echo -->Cardiac MRI 12/04: 1.9 x 1.6 cm mass in the lateral RA cavity. -- Most suggestive of thrombus--  calcified and  lamina and ted suggestive of chronic.; EF from 50% with no scar.   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   Uterine fibroid    s/p embolization   Past Surgical History:  Procedure Laterality Date   BREAST BIOPSY Right 12/31/2012   BREAST BIOPSY Right 09/06/2008   BREAST BIOPSY Right 08/11/2008   BREAST LUMPECTOMY  2010   right   Cardiac MRI  December 2014   1.9 x 1.6 cm mass in the lateral RA cavity. -- Most suggestive of thrombus--  calcified anndd  laammiinnaated suggestive of chronic.; EF from 50-4% with no scar. Trivial effusion   COLONOSCOPY  2010   LUMBAR LAMINECTOMY/DECOMPRESSION MICRODISCECTOMY  2007, 2008   Dr. Carles x 2   POWER PORT     TRANSTHORACIC ECHOCARDIOGRAM  November 2014; March 2016t   a) Mild LVH, EF of roughly 50%. Pseudo-normal LV filling (grade 3 diastolic soft. Mild LA dilation. 1.3 x 1.3 cm mass in right atrium.;; b) Low normal LV  function; grade 2 diastolic dysfunction; calcified right atrial mass; no change compared to 06/11/13.   TUBAL LIGATION  2010   UTERINE FIBROID EMBOLIZATION  2008 or 2009    Allergies  Allergies  Allergen Reactions   Adhesive [Tape] Hives   Morphine Hives    History of Present Illness    Olivia [name] has a PMH of right atrial thrombus which was associated with her PICC line.  She also has a PMH of hypertension, chronic diastolic CHF, dyspnea on exertion, and palpitations.  She was last seen by Dr. Clay on 11/23/2019.  During that time she was doing well.  She continued to have swelling in her left leg which was noted to be better.  She reported that the swelling was off and on.  She also continued to have neuropathic type pains from her back surgery.  She had been trying to increase her physical activity and have been adjusting her diet.  She was trying to lose weight slowly and had lost about 10 pounds over the previous year.  She had been going to the obesity clinic and they were helping her with her diet and help in her to become more active.  Blood pressure was well controlled at home in the 110-120 systolic BP range.  Overall she was doing well and denied symptoms of significant  chest pain and pressure at rest and with exertion.  She did note some shortness of breath with rushing upstairs or carrying heavy objects.  She denied wheezing.  She did note on and off episodes of itching sharp chest discomfort in her upper chest which was not associated with activity/exertion.  She presented to the clinic 06/19/2021 for follow-up evaluation stated she felt well.  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  She noted that the coursework was difficult and had increased her stress level.  Initially her blood pressure was 153/78 and on recheck was 118/76.  We reviewed her past medical history and history of high cholesterol.  She was no longer taking rosuvastatin  and  stated that she had been taking red yeast rice.  We reviewed the importance of high-fiber diet and physical activity.  She  had a sedentary lifestyle since August.  She was also not  eating as well as she was prior to August.  We  planned repeat lipids in February, I asked her to increase her physical activity with a goal of 150 minutes of moderate physical activity per week, asked her to increase the fiber in her diet, and planned follow-up in 12 months.  She presented to the clinic 11/30/21 for follow-up evaluation and stated she felt well.  She continued to study at [employer] for her interior design program.  She was no longer doing the architectural portion of the degree.    Her blood pressure was well controlled .  She continued to be physically active walking on her treadmill and helping care for her grandchildren.  She denies palpitations.  She was using some caffeine to help her with her schoolwork.  I gave her the sleep hygiene instructions, I asked her to increase her physical activity as tolerated and plan follow-up in 12 months.  She was seen in the emergency department on 12/19/2022 with vertigo.  She reported that she had been to her dentist to have her teeth cleaned.  She noted sudden lightheadedness which dissipated.  She then had a subsequent episode that was much worse.  She did note some blurry vision and reported numbness.  Her EKG showed normal sinus rhythm 75 bpm.  She was diagnosed with vertigo.  CBC and BMP were within normal limits.  Head CT was negative for acute abnormalities.  She received meclizine  and was instructed to follow-up as an outpatient with ENT.  She presented to the clinic 05/02/23 for follow-up evaluation and stated she continued to study interior design.  She had 4 semesters left.  She was pursuing entrepreneurship as well.  She reported that her father is in hospice.  He had been in good spirits.  Her blood pressure was some elevated initially at 140/88.  She had been out of  her medication for 2 days.  I  refilled this.  She also had some chest tightness which was stable.  It was Short-lived and not exertional.  On recheck her blood pressure was 136/78.  We reviewed her lab work.  I  planned follow-up in 12 months.   She presented to the clinic 07/17/24 with her husband.  She reported that her aunt passed away on 04-Sep-2024.    Her blood pressure initially today is 160/90 and on recheck her blood pressure was 152/84.  I will increased her lisinopril  to 10 mg daily, increased her atorvastatin  to 30 mg daily, ordered fasting lipids and LFTs for 6 weeks and plan follow-up in around  4 weeks.    She presents to the clinic today for follow-up evaluation and states***.  Today she denies chest pain, shortness of breath, lower extremity edema, fatigue, palpitations, melena, hematuria, hemoptysis, diaphoresis, weakness, presyncope, syncope, orthopnea, and PND.   Home Medications    Prior to Admission medications   Medication Sig Start Date End Date Taking? Authorizing Provider  ascorbic acid (VITAMIN C) 250 MG tablet Take 500 mg by mouth daily.    [provider]  Calcium  Carbonate-Vitamin D  600-200 MG-UNIT TABS Take 2 tablets by mouth daily.    [provider]  diltiazem  (CARDIZEM  CD) 180 MG 24 hr capsule TAKE 1 CAPSULE BY MOUTH DAILY 12/19/20   Anner Alm ORN, MD  esomeprazole (NEXIUM) 20 MG capsule Take 20 mg by mouth daily at 12 noon.    [provider]  furosemide  (LASIX ) 40 MG tablet Take 1 tablet (40 mg total) by mouth daily. NEED OV. 04/26/21   Anner Alm ORN, MD  gabapentin  (NEURONTIN ) 600 MG tablet Take 600 mg by mouth at bedtime.    [provider]  lidocaine  (LIDODERM ) 5 % Place 1 patch onto the skin daily. 04/30/14   [provider]  lisinopril  (ZESTRIL ) 5 MG tablet TAKE 1 TABLET(5 MG) BY MOUTH DAILY 05/15/21   Anner Alm ORN, MD  methocarbamol (ROBAXIN) 500 MG tablet Take 500 mg by mouth as needed for muscle spasms.     [provider]  Molnupiravir  200 MG CAPS Take 4 capsules (800 mg total) by mouth in the morning and at bedtime. 03/17/21   Randol Dawes, MD  Multiple Vitamins-Minerals (MULTIVITAMIN WITH MINERALS) tablet Take 1 tablet by mouth daily.    [provider]  nabumetone  (RELAFEN ) 500 MG tablet Take 500 mg by mouth 2 (two) times daily.    [provider]  potassium chloride  SA (KLOR-CON ) 20 MEQ tablet TAKE 1 TABLET BY MOUTH EVERY OTHER DAY 05/22/21   Anner Alm ORN, MD  vitamin E 400 UNIT capsule Take 400 Units by mouth daily.    [provider]    Family History    Family History  Problem Relation Age of Onset   Cancer Mother    Breast cancer Mother    Asthma Mother    Prostate cancer Father    Cancer Father    Heart disease Father        late 34's, stent   Diabetes Father    Congestive Heart Failure Father    Stroke Brother 4   Cancer Maternal Grandmother        renal cancer   Migraines Neg Hx    Dementia Neg Hx    Alzheimer's disease Neg Hx    She indicated that her mother is alive. She indicated that her father is deceased. She indicated that both of her brothers are alive. She indicated that the status of her maternal grandmother is unknown. She indicated that her son is alive. She indicated that the status of her neg hx is unknown.   Social History    Social History   Socioeconomic History   Marital status: Married    Spouse name: Not on file   Number of children: Not on file   Years of education: Not on file   Highest education level: Not on file  Occupational History   Not on file  Tobacco Use   Smoking status: Never   Smokeless tobacco: Never  Vaping Use   Vaping status: Never Used  Substance and Sexual  Activity   Alcohol use: No    Comment: occasionally   Drug use: No   Sexual activity: Yes  Other Topics Concern   Not on file  Social History Narrative   She is married. Has 1 son (who is married, has 3 kids).  Total of  5  grandchildren between her and her husband   She walks with a cane sometimes (when back pain flaring). She is currently disabled (back pain, neuropathy, chronic pain).   Never smoked.   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   Hopes to help her husband advertising account planner).      Updated 04/2024   Social Drivers of Health   Tobacco Use: Low Risk (07/17/2024)   Patient History    Smoking Tobacco Use: Never    Smokeless Tobacco Use: Never    Passive Exposure: Not on file  Financial Resource Strain: Low Risk (05/07/2024)   Overall Financial Resource Strain (CARDIA)    Difficulty of Paying Living Expenses: Not very hard  Food Insecurity: No Food Insecurity (05/07/2024)   Epic    Worried About Programme Researcher, Broadcasting/film/video in the Last Year: Never true    Ran Out of Food in the Last Year: Never true  Transportation Needs: No Transportation Needs (05/07/2024)   Epic    Lack of Transportation (Medical): No    Lack of Transportation (Non-Medical): No  Physical Activity: Inactive (05/07/2024)   Exercise Vital Sign    Days of Exercise per Week: 0 days    Minutes of Exercise per Session: 0 min  Stress: Stress Concern Present (05/07/2024)   [employer] of Occupational Health - Occupational Stress Questionnaire    Feeling of Stress: Rather much  Social Connections: Unknown (05/07/2024)   Social Connection and Isolation Panel    Frequency of Communication with Friends and Family: Twice a week    Frequency of Social Gatherings with Friends and Family: Patient declined    Attends Religious Services: More than 4 times per year    Active Member of [employer] or Organizations: Yes    Attends Engineer, Structural: More than 4 times per year    Marital Status: Married  Catering Manager Violence: Not At Risk (05/07/2024)   Epic    Fear of Current or Ex-Partner: No     Emotionally Abused: No    Physically Abused: No    Sexually Abused: No  Depression (PHQ2-9): Medium Risk (05/07/2024)   Depression (PHQ2-9)    PHQ-2 Score: 5  Alcohol Screen: Not on file  Housing: Low Risk (05/07/2024)   Epic    Unable to Pay for Housing in the Last Year: No    Number of Times Moved in the Last Year: 0    Homeless in the Last Year: No  Utilities: Not At Risk (05/07/2024)   Epic    Threatened with loss of utilities: No  Health Literacy: Not on file     Review of Systems    General:  No chills, fever, night sweats or weight changes.  Cardiovascular:  No chest pain, dyspnea on exertion, edema, orthopnea, palpitations, paroxysmal nocturnal dyspnea. Dermatological: No rash, lesions/masses Respiratory: No cough, dyspnea Urologic: No hematuria, dysuria Abdominal:   No nausea, vomiting, diarrhea, bright red blood per rectum, melena, or hematemesis Neurologic:  No visual changes, wkns, changes in mental status. All other systems reviewed and are otherwise negative except as noted  above.  Physical Exam    VS:  There were no vitals taken for this visit. , BMI There is no height or weight on file to calculate BMI. GEN: Well nourished, well developed, in no acute distress. HEENT: normal. Neck: Supple, no JVD, carotid bruits, or masses. Cardiac: RRR, no murmurs, rubs, or gallops. No clubbing, cyanosis, generalized right ankle edema.  Radials/DP/PT 2+ and equal bilaterally.  Respiratory:  Respirations regular and unlabored, clear to auscultation bilaterally. GI: Soft, nontender, nondistended, BS + x 4. MS: no deformity or atrophy. Skin: warm and dry, no rash. Neuro:  Strength and sensation are intact. Psych: Normal affect.  Accessory Clinical Findings    Recent Labs: 04/26/2024: ALT 16; BUN 15; Creatinine, Ser 0.75; Hemoglobin 14.2; Platelets 267; Potassium 3.2; Sodium 142   Recent Lipid Panel    Component Value Date/Time   CHOL 201 (H) 12/25/2023 1015   TRIG 99  12/25/2023 1015   HDL 70 12/25/2023 1015   CHOLHDL 2.9 12/25/2023 1015   CHOLHDL 5.7 08/22/2011 0000   VLDL 34 08/22/2011 0000   LDLCALC 113 (H) 12/25/2023 1015    ECG personally reviewed by me today-none today.  EKG 11/30/2021 normal sinus rhythm with sinus arrhythmia 73 bpm no ST or T wave deviation-no acute changes.  normal sinus rhythm nonspecific T wave abnormality 80 bpm- No acute changes  Echocardiogram 10/19/2014  Study Conclusions   - Left ventricle: The cavity size was normal. Wall thickness was    normal. Systolic function was normal. The estimated ejection    fraction was in the range of 50% to 55%. Wall motion was normal;    there were no regional wall motion abnormalities. Features are    consistent with a pseudonormal left ventricular filling pattern,    with concomitant abnormal relaxation and increased filling    pressure (grade 2 diastolic dysfunction).   Impressions:   - Low normal LV function; grade 2 diastolic dysfunction; calcified    right atrial mass; no change compared to 06/11/13.  Assessment & Plan   1.Essential hypertension-BP today 1 52/***84 Maintain blood pressure log Continue diltiazem , lisinopril , furosemide , potassium Increase lisinopril  to 10 mg dail***y Heart healthy low-sodium diet Mindfulness stress reduction  Ordered BMP  Hyperlipidemia- Goal less than 100.  LDL 113 on 12/25/2023 Continue red yeast rice, high-fiber diet Continue atorvastatin  to 30 mg daily Repeat fasting lipids and LFTs*** Follows with PCP   Diastolic CHF/history of right atrial thrombus-weight today 185***.5.  Well compensated. Continue furosemide , potassium, lisinopril  Daily weights Heart healthy low-sodium diet    Dyspnea on exertion-breathing stable. Heart healthy low-sodium diet Continue weight loss Maintain physical activity Repeat echocardiogram when clinically indicated.          Disposition: Follow-up with Dr. Anner or me in  9-12***months.   Olivia [name]. Kla Bily NP-C    08/12/2024, 7:38 AM [facility] [address] Office [phone] Fax [phone]  Notice: This dictation was prepared with Dragon dictation along with smaller phrase technology. Any transcriptional errors that result from this process are unintentional and may not be corrected upon review.  I spent 15*** minutes examining this patient, reviewing medications, and using patient centered shared decision making involving her cardiac care.   I spent  20 minutes reviewing her past medical history,  medications, and prior cardiac tests.  "

## 2024-08-14 ENCOUNTER — Ambulatory Visit: Admitting: General Practice

## 2024-08-17 ENCOUNTER — Other Ambulatory Visit: Payer: Self-pay | Admitting: Cardiology

## 2024-10-02 ENCOUNTER — Ambulatory Visit: Admitting: General Practice

## 2025-01-07 ENCOUNTER — Encounter: Payer: Self-pay | Admitting: Family Medicine

## 2025-05-10 ENCOUNTER — Ambulatory Visit: Payer: Self-pay | Admitting: Family Medicine
